# Patient Record
Sex: Female | Born: 1935
Health system: Southern US, Community
[De-identification: ages and names within clinical notes are randomized; demographics above are authoritative.]

## PROBLEM LIST (undated history)

## (undated) DIAGNOSIS — I10 Essential (primary) hypertension: Secondary | ICD-10-CM

## (undated) DIAGNOSIS — R05 Cough: Secondary | ICD-10-CM

## (undated) DIAGNOSIS — I639 Cerebral infarction, unspecified: Secondary | ICD-10-CM

## (undated) DIAGNOSIS — D491 Neoplasm of unspecified behavior of respiratory system: Secondary | ICD-10-CM

## (undated) DIAGNOSIS — R42 Dizziness and giddiness: Secondary | ICD-10-CM

## (undated) DIAGNOSIS — R053 Chronic cough: Secondary | ICD-10-CM

## (undated) DIAGNOSIS — E785 Hyperlipidemia, unspecified: Secondary | ICD-10-CM

## (undated) HISTORY — DX: Chronic cough: R05.3

## (undated) HISTORY — PX: BREAST BIOPSY: SHX20

## (undated) HISTORY — PX: TONSILLECTOMY AND ADENOIDECTOMY: SUR1326

## (undated) HISTORY — PX: CATARACT EXTRACTION, BILATERAL: SHX1313

## (undated) HISTORY — DX: Cough: R05

## (undated) HISTORY — DX: Cerebral infarction, unspecified: I63.9

## (undated) HISTORY — DX: Hyperlipidemia, unspecified: E78.5

## (undated) HISTORY — PX: BACK SURGERY: SHX140

## (undated) HISTORY — DX: Essential (primary) hypertension: I10

## (undated) HISTORY — DX: Neoplasm of unspecified behavior of respiratory system: D49.1

---
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
## 1982-10-17 DIAGNOSIS — D491 Neoplasm of unspecified behavior of respiratory system: Secondary | ICD-10-CM

## 1982-10-17 HISTORY — PX: LUNG SURGERY: SHX703

## 1982-10-17 HISTORY — DX: Neoplasm of unspecified behavior of respiratory system: D49.1

## 1998-08-26 ENCOUNTER — Ambulatory Visit (HOSPITAL_COMMUNITY): Admission: RE | Admit: 1998-08-26 | Discharge: 1998-08-26 | Payer: Self-pay | Admitting: *Deleted

## 1999-08-30 ENCOUNTER — Ambulatory Visit (HOSPITAL_COMMUNITY): Admission: RE | Admit: 1999-08-30 | Discharge: 1999-08-30 | Payer: Self-pay

## 2000-09-05 ENCOUNTER — Ambulatory Visit (HOSPITAL_COMMUNITY): Admission: RE | Admit: 2000-09-05 | Discharge: 2000-09-05 | Payer: Self-pay

## 2000-11-14 ENCOUNTER — Encounter: Payer: Self-pay | Admitting: Otolaryngology

## 2000-11-14 ENCOUNTER — Ambulatory Visit (HOSPITAL_COMMUNITY): Admission: RE | Admit: 2000-11-14 | Discharge: 2000-11-14 | Payer: Self-pay | Admitting: Otolaryngology

## 2001-09-12 ENCOUNTER — Encounter: Payer: Self-pay | Admitting: Family Medicine

## 2001-09-12 ENCOUNTER — Encounter (INDEPENDENT_AMBULATORY_CARE_PROVIDER_SITE_OTHER): Payer: Self-pay | Admitting: *Deleted

## 2001-09-12 ENCOUNTER — Encounter: Admission: RE | Admit: 2001-09-12 | Discharge: 2001-09-12 | Payer: Self-pay | Admitting: Family Medicine

## 2002-10-15 ENCOUNTER — Encounter: Payer: Self-pay | Admitting: Family Medicine

## 2002-10-15 ENCOUNTER — Encounter: Admission: RE | Admit: 2002-10-15 | Discharge: 2002-10-15 | Payer: Self-pay | Admitting: Family Medicine

## 2003-05-01 ENCOUNTER — Encounter: Payer: Self-pay | Admitting: Cardiology

## 2003-05-01 ENCOUNTER — Ambulatory Visit (HOSPITAL_COMMUNITY): Admission: RE | Admit: 2003-05-01 | Discharge: 2003-05-01 | Payer: Self-pay | Admitting: Family Medicine

## 2003-11-19 ENCOUNTER — Encounter: Admission: RE | Admit: 2003-11-19 | Discharge: 2003-11-19 | Payer: Self-pay | Admitting: Family Medicine

## 2004-11-22 ENCOUNTER — Encounter: Admission: RE | Admit: 2004-11-22 | Discharge: 2004-11-22 | Payer: Self-pay | Admitting: Family Medicine

## 2005-12-14 ENCOUNTER — Encounter: Admission: RE | Admit: 2005-12-14 | Discharge: 2005-12-14 | Payer: Self-pay | Admitting: Family Medicine

## 2006-10-20 ENCOUNTER — Encounter: Admission: RE | Admit: 2006-10-20 | Discharge: 2006-10-20 | Payer: Self-pay | Admitting: Family Medicine

## 2007-03-05 ENCOUNTER — Other Ambulatory Visit: Admission: RE | Admit: 2007-03-05 | Discharge: 2007-03-05 | Payer: Self-pay | Admitting: Family Medicine

## 2007-10-24 ENCOUNTER — Encounter: Admission: RE | Admit: 2007-10-24 | Discharge: 2007-10-24 | Payer: Self-pay | Admitting: Family Medicine

## 2008-10-27 ENCOUNTER — Encounter: Admission: RE | Admit: 2008-10-27 | Discharge: 2008-10-27 | Payer: Self-pay | Admitting: Family Medicine

## 2009-05-17 DEATH — deceased

## 2009-09-09 ENCOUNTER — Ambulatory Visit (HOSPITAL_COMMUNITY): Admission: RE | Admit: 2009-09-09 | Discharge: 2009-09-09 | Payer: Self-pay | Admitting: Interventional Radiology

## 2009-10-03 ENCOUNTER — Emergency Department (HOSPITAL_COMMUNITY): Admission: EM | Admit: 2009-10-03 | Discharge: 2009-10-03 | Payer: Self-pay | Admitting: Emergency Medicine

## 2009-10-29 ENCOUNTER — Encounter: Admission: RE | Admit: 2009-10-29 | Discharge: 2009-10-29 | Payer: Self-pay | Admitting: Family Medicine

## 2009-12-24 ENCOUNTER — Encounter (INDEPENDENT_AMBULATORY_CARE_PROVIDER_SITE_OTHER): Payer: Self-pay | Admitting: Emergency Medicine

## 2009-12-24 ENCOUNTER — Ambulatory Visit: Payer: Self-pay | Admitting: Vascular Surgery

## 2009-12-24 ENCOUNTER — Inpatient Hospital Stay (HOSPITAL_COMMUNITY): Admission: EM | Admit: 2009-12-24 | Discharge: 2009-12-26 | Payer: Self-pay | Admitting: Emergency Medicine

## 2009-12-24 ENCOUNTER — Ambulatory Visit: Payer: Self-pay | Admitting: Cardiology

## 2010-04-08 ENCOUNTER — Encounter: Admission: RE | Admit: 2010-04-08 | Discharge: 2010-04-08 | Payer: Self-pay | Admitting: Family Medicine

## 2010-04-14 ENCOUNTER — Encounter: Admission: RE | Admit: 2010-04-14 | Discharge: 2010-04-14 | Payer: Self-pay | Admitting: Family Medicine

## 2011-01-10 LAB — HEPATIC FUNCTION PANEL
ALT: 16 U/L (ref 0–35)
AST: 29 U/L (ref 0–37)
Albumin: 3.9 g/dL (ref 3.5–5.2)
Alkaline Phosphatase: 78 U/L (ref 39–117)
Bilirubin, Direct: 0.2 mg/dL (ref 0.0–0.3)
Indirect Bilirubin: 0.8 mg/dL (ref 0.3–0.9)
Total Bilirubin: 1 mg/dL (ref 0.3–1.2)
Total Protein: 6.9 g/dL (ref 6.0–8.3)

## 2011-01-10 LAB — URINALYSIS, ROUTINE W REFLEX MICROSCOPIC
Glucose, UA: NEGATIVE mg/dL
Protein, ur: NEGATIVE mg/dL
Specific Gravity, Urine: 1.005 (ref 1.005–1.030)
Urobilinogen, UA: 0.2 mg/dL (ref 0.0–1.0)

## 2011-01-10 LAB — URINE MICROSCOPIC-ADD ON

## 2011-01-10 LAB — COMPREHENSIVE METABOLIC PANEL WITH GFR
ALT: 15 U/L (ref 0–35)
AST: 21 U/L (ref 0–37)
Albumin: 3.2 g/dL — ABNORMAL LOW (ref 3.5–5.2)
Alkaline Phosphatase: 63 U/L (ref 39–117)
BUN: 16 mg/dL (ref 6–23)
CO2: 25 meq/L (ref 19–32)
Calcium: 8.3 mg/dL — ABNORMAL LOW (ref 8.4–10.5)
Chloride: 109 meq/L (ref 96–112)
Creatinine, Ser: 0.8 mg/dL (ref 0.4–1.2)
GFR calc non Af Amer: >60 mL/min
Glucose, Bld: 90 mg/dL (ref 70–99)
Potassium: 3.9 meq/L (ref 3.5–5.1)
Sodium: 141 meq/L (ref 135–145)
Total Bilirubin: 1 mg/dL (ref 0.3–1.2)
Total Protein: 5.9 g/dL — ABNORMAL LOW (ref 6.0–8.3)

## 2011-01-10 LAB — POCT CARDIAC MARKERS
CKMB, poc: 1.4 ng/mL (ref 1.0–8.0)
Myoglobin, poc: 84 ng/mL (ref 12–200)
Troponin i, poc: <0.05 ng/mL (ref 0.00–0.09)
Troponin i, poc: <0.05 ng/mL (ref 0.00–0.09)

## 2011-01-10 LAB — DIFFERENTIAL
Basophils Absolute: 0 10*3/uL (ref 0.0–0.1)
Lymphocytes Relative: 33 % (ref 12–46)
Neutro Abs: 3.3 10*3/uL (ref 1.7–7.7)
Neutrophils Relative %: 59 % (ref 43–77)

## 2011-01-10 LAB — APTT: aPTT: 28 s (ref 24–37)

## 2011-01-10 LAB — CBC
HCT: 39.3 % (ref 36.0–46.0)
Hemoglobin: 13.4 g/dL (ref 12.0–15.0)
MCV: 99.4 fL (ref 78.0–100.0)
Platelets: 183 10*3/uL (ref 150–400)
RBC: 3.96 MIL/uL (ref 3.87–5.11)
RDW: 12.7 % (ref 11.5–15.5)
WBC: 4.9 10*3/uL (ref 4.0–10.5)

## 2011-01-10 LAB — LIPID PANEL
LDL Cholesterol: 118 mg/dL — ABNORMAL HIGH (ref 0–99)
Triglycerides: 69 mg/dL (ref <150)

## 2011-01-10 LAB — PROTIME-INR: Prothrombin Time: 13.2 seconds (ref 11.6–15.2)

## 2011-01-10 LAB — CK TOTAL AND CKMB (NOT AT ARMC)
CK, MB: 2.5 ng/mL (ref 0.3–4.0)
Relative Index: INVALID (ref 0.0–2.5)
Total CK: 40 U/L (ref 7–177)

## 2011-01-10 LAB — TROPONIN I

## 2011-01-10 LAB — HEMOGLOBIN A1C
Hgb A1c MFr Bld: 5.6 % (ref 4.6–6.1)
Mean Plasma Glucose: 114 mg/dL

## 2011-01-10 LAB — URINE CULTURE

## 2011-01-10 LAB — BASIC METABOLIC PANEL
BUN: 14 mg/dL (ref 6–23)
Calcium: 8.6 mg/dL (ref 8.4–10.5)
GFR calc non Af Amer: >60 mL/min (ref >60)
Glucose, Bld: 96 mg/dL (ref 70–99)

## 2011-01-17 LAB — DIFFERENTIAL
Basophils Absolute: 0 10*3/uL (ref 0.0–0.1)
Basophils Relative: 1 % (ref 0–1)
Eosinophils Absolute: 0 10*3/uL (ref 0.0–0.7)
Eosinophils Relative: 0 % (ref 0–5)
Lymphocytes Relative: 28 % (ref 12–46)
Monocytes Absolute: 0.5 10*3/uL (ref 0.1–1.0)

## 2011-01-17 LAB — COMPREHENSIVE METABOLIC PANEL
ALT: 16 U/L (ref 0–35)
AST: 24 U/L (ref 0–37)
Albumin: 3.8 g/dL (ref 3.5–5.2)
Alkaline Phosphatase: 86 U/L (ref 39–117)
CO2: 27 mEq/L (ref 19–32)
Chloride: 105 mEq/L (ref 96–112)
Creatinine, Ser: 0.86 mg/dL (ref 0.4–1.2)
GFR calc Af Amer: >60 mL/min (ref >60)
GFR calc non Af Amer: >60 mL/min (ref >60)
Potassium: 3.8 mEq/L (ref 3.5–5.1)
Total Bilirubin: 0.7 mg/dL (ref 0.3–1.2)

## 2011-01-17 LAB — CBC
HCT: 40.1 % (ref 36.0–46.0)
Hemoglobin: 14 g/dL (ref 12.0–15.0)
MCV: 99.6 fL (ref 78.0–100.0)
Platelets: 176 10*3/uL (ref 150–400)
WBC: 5.8 10*3/uL (ref 4.0–10.5)

## 2011-01-17 LAB — PROTIME-INR: Prothrombin Time: 13.3 seconds (ref 11.6–15.2)

## 2011-01-19 LAB — CBC
HCT: 43.5 % (ref 36.0–46.0)
Hemoglobin: 15 g/dL (ref 12.0–15.0)
MCV: 99.5 fL (ref 78.0–100.0)
Platelets: 286 10*3/uL (ref 150–400)
RDW: 13.1 % (ref 11.5–15.5)

## 2011-01-19 LAB — BASIC METABOLIC PANEL
BUN: 20 mg/dL (ref 6–23)
CO2: 28 mEq/L (ref 19–32)
Chloride: 105 mEq/L (ref 96–112)
GFR calc non Af Amer: >60 mL/min (ref >60)
Glucose, Bld: 99 mg/dL (ref 70–99)
Potassium: 3.8 mEq/L (ref 3.5–5.1)
Sodium: 140 mEq/L (ref 135–145)

## 2011-01-19 LAB — APTT: aPTT: 27 seconds (ref 24–37)

## 2011-03-11 ENCOUNTER — Other Ambulatory Visit: Payer: Self-pay | Admitting: Family Medicine

## 2011-03-11 DIAGNOSIS — Z1231 Encounter for screening mammogram for malignant neoplasm of breast: Secondary | ICD-10-CM

## 2011-04-11 ENCOUNTER — Ambulatory Visit
Admission: RE | Admit: 2011-04-11 | Discharge: 2011-04-11 | Disposition: A | Discharge status: Home / Self Care | Payer: BC Managed Care – PPO | Source: Ambulatory Visit | Attending: Family Medicine | Admitting: Family Medicine

## 2011-04-11 DIAGNOSIS — Z1231 Encounter for screening mammogram for malignant neoplasm of breast: Secondary | ICD-10-CM

## 2011-06-09 ENCOUNTER — Other Ambulatory Visit: Payer: Self-pay | Admitting: Otolaryngology

## 2011-06-09 ENCOUNTER — Ambulatory Visit
Admission: RE | Admit: 2011-06-09 | Discharge: 2011-06-09 | Disposition: A | Discharge status: Home / Self Care | Payer: Medicare Other | Source: Ambulatory Visit | Attending: Otolaryngology | Admitting: Otolaryngology

## 2011-06-09 DIAGNOSIS — R05 Cough: Secondary | ICD-10-CM

## 2011-06-09 DIAGNOSIS — R059 Cough, unspecified: Secondary | ICD-10-CM

## 2011-08-24 ENCOUNTER — Institutional Professional Consult (permissible substitution): Payer: Medicare Other | Admitting: Critical Care Medicine

## 2011-09-06 ENCOUNTER — Encounter: Payer: Self-pay | Admitting: Critical Care Medicine

## 2011-09-07 ENCOUNTER — Encounter: Payer: Self-pay | Admitting: Critical Care Medicine

## 2011-09-07 ENCOUNTER — Ambulatory Visit (INDEPENDENT_AMBULATORY_CARE_PROVIDER_SITE_OTHER): Payer: Medicare Other | Admitting: Critical Care Medicine

## 2011-09-07 VITALS — BP 124/78 | HR 61 | Temp 97.9°F | Ht 64.0 in | Wt 118.0 lb

## 2011-09-07 DIAGNOSIS — R05 Cough: Secondary | ICD-10-CM | POA: Insufficient documentation

## 2011-09-07 DIAGNOSIS — I6350 Cerebral infarction due to unspecified occlusion or stenosis of unspecified cerebral artery: Secondary | ICD-10-CM

## 2011-09-07 DIAGNOSIS — I639 Cerebral infarction, unspecified: Secondary | ICD-10-CM

## 2011-09-07 DIAGNOSIS — R059 Cough, unspecified: Secondary | ICD-10-CM

## 2011-09-07 DIAGNOSIS — C3490 Malignant neoplasm of unspecified part of unspecified bronchus or lung: Secondary | ICD-10-CM

## 2011-09-07 DIAGNOSIS — I10 Essential (primary) hypertension: Secondary | ICD-10-CM

## 2011-09-07 DIAGNOSIS — R053 Chronic cough: Secondary | ICD-10-CM | POA: Insufficient documentation

## 2011-09-07 MED ORDER — HYDROCOD POLST-CPM POLST ER 10-8 MG PO CP12: 1.0000 | ORAL_CAPSULE | Freq: Two times a day (BID) | ORAL | Status: DC | PRN

## 2011-09-07 MED ORDER — FLUTICASONE PROPIONATE 50 MCG/ACT NA SUSP: 2.0000 | Freq: Every day | NASAL | Status: DC

## 2011-09-07 MED ORDER — BENZONATATE 100 MG PO CAPS: ORAL_CAPSULE | ORAL | Status: AC

## 2011-09-07 MED ORDER — BUDESONIDE 180 MCG/ACT IN AEPB: 2.0000 | INHALATION_SPRAY | Freq: Two times a day (BID) | RESPIRATORY_TRACT | Status: DC

## 2011-09-07 MED ORDER — CHLORPHENIRAMINE MALEATE CR 8 MG PO CPCR: 8.0000 mg | ORAL_CAPSULE | Freq: Every day | ORAL | Status: AC

## 2011-09-07 NOTE — Progress Notes (Signed)
Subjective:    Patient ID: KLOHE LOVERING, female    DOB: 01/25/1925, 77 y.o.   MRN: [id number]  HPI Comments: Chronic cough x1 year. Constantly clearing throat, hoarse.   Coughs up mucus , foamy, white  Cough This is a chronic problem. The current episode started more than 1 year ago. The problem has been unchanged. The problem occurs constantly (worse with exertion, ok flat at night). The cough is productive of sputum. Associated symptoms include nasal congestion, postnasal drip, a sore throat and shortness of breath. Pertinent negatives include no chest pain, chills, ear pain, eye redness, fever, headaches, heartburn, hemoptysis, myalgias, rash, rhinorrhea or wheezing. The symptoms are aggravated by pollens. Risk factors for lung disease include smoking/tobacco exposure. Treatments tried: PPIs. The treatment provided no relief. There is no history of asthma, bronchiectasis, bronchitis, COPD, emphysema, environmental allergies or pneumonia.   77 y.o.F Chronic cough  Past Medical History  Diagnosis Date  . Lung tumor 1984    LUL  . HTN (hypertension)   . Stroke   . Chronic cough      Family History  Problem Relation Age of Onset  . Diabetes Father   . Hearing loss    . Hypertension Father   . Stroke Mother   . Tuberculosis Sister   . Emphysema Brother      History   Social History  . Marital Status: Widowed    Spouse Name: N/A    Number of Children: N/A  . Years of Education: N/A   Occupational History  . retired    Social History Main Topics  . Smoking status: Former Smoker -- 0.5 packs/day for 40 years    Types: Cigarettes    Quit date: 10/17/1982  . Smokeless tobacco: Not on file  . Alcohol Use: No  . Drug Use: No  . Sexually Active: Not on file   Other Topics Concern  . Not on file   Social History Narrative  . No narrative on file     Allergies  Allergen Reactions  . Lipitor (Atorvastatin Calcium)   . Plavix (Clopidogrel Bisulfate)   . Statins     REACTION: effects muscles     Outpatient Prescriptions Prior to Visit  Medication Sig Dispense Refill  . amLODipine (NORVASC) 5 MG tablet Take 2.5 mg by mouth daily.       Marland Kitchen omeprazole (PRILOSEC) 40 MG capsule Take 40 mg by mouth 2 (two) times daily.           Review of Systems  Constitutional: Negative for fever, chills, diaphoresis, activity change, appetite change, fatigue and unexpected weight change.  HENT: Positive for congestion, sore throat and postnasal drip. Negative for hearing loss, ear pain, nosebleeds, facial swelling, rhinorrhea, sneezing, mouth sores, trouble swallowing, neck pain, neck stiffness, dental problem, voice change, sinus pressure, tinnitus and ear discharge.   Eyes: Negative for photophobia, discharge, redness, itching and visual disturbance.  Respiratory: Positive for cough and shortness of breath. Negative for apnea, hemoptysis, choking, chest tightness, wheezing and stridor.   Cardiovascular: Negative for chest pain, palpitations and leg swelling.  Gastrointestinal: Negative for heartburn, nausea, vomiting, abdominal pain, constipation, blood in stool and abdominal distention.  Genitourinary: Negative for dysuria, urgency, frequency, hematuria, flank pain, decreased urine volume and difficulty urinating.  Musculoskeletal: Negative for myalgias, back pain, joint swelling, arthralgias and gait problem.  Skin: Negative for color change, pallor and rash.  Neurological: Negative for dizziness, tremors, seizures, syncope, speech difficulty, weakness, light-headedness, numbness and headaches.  Hematological: Negative for environmental allergies and adenopathy. Does not bruise/bleed easily.  Psychiatric/Behavioral: Negative for confusion, sleep disturbance, dysphoric mood and agitation. The patient is not nervous/anxious.        Objective:   Physical Exam  Filed Vitals:   09/07/11 1026  BP: 124/78  Pulse: 61  Temp: 97.9 F (36.6 C)  TempSrc: Oral  Height:  5\' 4"  (1.626 m)  Weight: 118 lb (53.524 kg)  SpO2: 97%    Gen: Pleasant,thin WF incessantly clearing throat , in no distress,  normal affect  ENT: No lesions,  mouth clear,  oropharynx clear, ++ postnasal drip, mild nasal inflammation  Neck: No JVD, no TMG, no carotid bruits  Lungs: No use of accessory muscles, no dullness to percussion, clear without rales or rhonchi  Cardiovascular: RRR, heart sounds normal, no murmur or gallops, no peripheral edema  Abdomen: soft and NT, no HSM,  BS normal  Musculoskeletal: No deformities, no cyanosis or clubbing  Neuro: alert, non focal  Skin: Warm, no lesions or rashes  No results found.       Assessment & Plan:   Chronic cough Severe cyclical cough syndrome due to post nasal drip, reflux disease, and lower airway inflammation from reactive airways disease Noted normal spirometry Plan Cyclic cough protocol with Tussi caps and Tessalon Perles Begin inhaled Pulmicort 2 puffs twice a day Pheniramine 8 mg at bedtime Flonase 2 sprays each nostril daily    Updated Medication List Outpatient Encounter Prescriptions as of 09/07/2011  Medication Sig Dispense Refill  . amLODipine (NORVASC) 5 MG tablet Take 2.5 mg by mouth daily.       . benzonatate (TESSALON) 100 MG capsule Use per cough protocol  1-2 every 4 hours  90 capsule  4  . budesonide (PULMICORT FLEXHALER) 180 MCG/ACT inhaler Inhale 2 puffs into the lungs 2 (two) times daily.  1 each  5  . chlorpheniramine (CHLOR-TRIMETON) 8 MG capsule Take 1 capsule (8 mg total) by mouth at bedtime.  90 capsule  6  . fluticasone (FLONASE) 50 MCG/ACT nasal spray Place 2 sprays into the nose daily.  16 g  6  . Hydrocod Polst-Chlorphen Polst (TUSSICAPS) 10-8 MG CP12 Take 1 capsule by mouth 2 (two) times daily as needed.  20 each  0  . DISCONTD: omeprazole (PRILOSEC) 40 MG capsule Take 40 mg by mouth 2 (two) times daily.

## 2011-09-07 NOTE — Patient Instructions (Addendum)
Start cyclic cough protocol  With tussicaps and tessalon Start fluticasone nasal spray  two puff twice daily Use chlorpheniramine 8mg  at bedtime Start pulmicort two puff twice daily Return 1 month

## 2011-09-07 NOTE — Assessment & Plan Note (Signed)
Severe cyclical cough syndrome due to post nasal drip, reflux disease, and lower airway inflammation from reactive airways disease Noted normal spirometry Plan Cyclic cough protocol with Tussi caps and Tessalon Perles Begin inhaled Pulmicort 2 puffs twice a day Pheniramine 8 mg at bedtime Flonase 2 sprays each nostril daily

## 2011-09-26 ENCOUNTER — Ambulatory Visit: Payer: Medicare Other | Admitting: Critical Care Medicine

## 2011-10-19 ENCOUNTER — Ambulatory Visit (INDEPENDENT_AMBULATORY_CARE_PROVIDER_SITE_OTHER): Payer: Medicare Other | Admitting: Critical Care Medicine

## 2011-10-19 ENCOUNTER — Encounter: Payer: Self-pay | Admitting: Critical Care Medicine

## 2011-10-19 VITALS — BP 160/80 | HR 58 | Temp 97.6°F | Ht 64.0 in | Wt 117.0 lb

## 2011-10-19 DIAGNOSIS — R05 Cough: Secondary | ICD-10-CM

## 2011-10-19 DIAGNOSIS — R1310 Dysphagia, unspecified: Secondary | ICD-10-CM

## 2011-10-19 DIAGNOSIS — R053 Chronic cough: Secondary | ICD-10-CM

## 2011-10-19 DIAGNOSIS — R059 Cough, unspecified: Secondary | ICD-10-CM

## 2011-10-19 MED ORDER — OMEPRAZOLE 20 MG PO CPDR: 20.0000 mg | DELAYED_RELEASE_CAPSULE | Freq: Every day | ORAL | Status: DC

## 2011-10-19 NOTE — Assessment & Plan Note (Addendum)
Severe cyclical cough syndrome due to post nasal drip, reflux disease,Noted normal spirometry.  Doubt primary lung disease.. Failed cyclical cough.  Throat clearing is chronically habitual.  ?esophageal disorder Plan chk barium swallow ?GI referral Cont cough suppressants D/c pulmicort D/c antihistamines Cont flonase

## 2011-10-19 NOTE — Patient Instructions (Signed)
Stop tussicaps Stop chlorpheniramine Start omeprazole daily Obtain a barium swallow evaluation of esophagus Stop pulmicort

## 2011-10-20 NOTE — Progress Notes (Signed)
Subjective:    Patient ID: Belinda Webb, female    DOB: [birth year], 79 y.o.   MRN: [id number]  HPI Comments: Chronic cough x1 year. Constantly clearing throat, hoarse.   Coughs up mucus , foamy, white  Cough This is a chronic problem. The current episode started more than 1 year ago. The problem has been unchanged. The problem occurs constantly (worse with exertion, ok flat at night). The cough is productive of sputum. Associated symptoms include nasal congestion, postnasal drip, a sore throat and shortness of breath. Pertinent negatives include no chest pain, chills, ear pain, eye redness, fever, headaches, heartburn, hemoptysis, myalgias, rash, rhinorrhea or wheezing. The symptoms are aggravated by pollens. Risk factors for lung disease include smoking/tobacco exposure. Treatments tried: PPIs. The treatment provided no relief. There is no history of asthma, bronchiectasis, bronchitis, COPD, emphysema, environmental allergies or pneumonia.   78 y.o.F Chronic cough  10/19/11 When last seen Rx for cyclical cough and ICS have not helped Pt notes burping and worse after eating. Pt still incessantly clears throat and feels like she is choking  Past Medical History  Diagnosis Date  . Lung tumor 1984    LUL  . HTN (hypertension)   . Stroke   . Chronic cough      Family History  Problem Relation Age of Onset  . Diabetes Father   . Hearing loss    . Hypertension Father   . Stroke Mother   . Tuberculosis Sister   . Emphysema Brother      History   Social History  . Marital Status: Widowed    Spouse Name: N/A    Number of Children: N/A  . Years of Education: N/A   Occupational History  . retired    Social History Main Topics  . Smoking status: Former Smoker -- 0.5 packs/day for 40 years    Types: Cigarettes    Quit date: 10/17/1982  . Smokeless tobacco: Not on file  . Alcohol Use: No  . Drug Use: No  . Sexually Active: Not on file   Other Topics Concern  . Not on file     Social History Narrative  . No narrative on file     Allergies  Allergen Reactions  . Lipitor (Atorvastatin Calcium)   . Plavix (Clopidogrel Bisulfate)   . Statins     REACTION: effects muscles     Outpatient Prescriptions Prior to Visit  Medication Sig Dispense Refill  . amLODipine (NORVASC) 5 MG tablet Take 2.5 mg by mouth daily.       . fluticasone (FLONASE) 50 MCG/ACT nasal spray Place 2 sprays into the nose daily.  16 g  6  . budesonide (PULMICORT FLEXHALER) 180 MCG/ACT inhaler Inhale 2 puffs into the lungs 2 (two) times daily.  1 each  5  . Hydrocod Polst-Chlorphen Polst (TUSSICAPS) 10-8 MG CP12 Take 1 capsule by mouth 2 (two) times daily as needed.  20 each  0     Review of Systems  Constitutional: Negative for fever, chills, diaphoresis, activity change, appetite change, fatigue and unexpected weight change.  HENT: Positive for congestion, sore throat and postnasal drip. Negative for hearing loss, ear pain, nosebleeds, facial swelling, rhinorrhea, sneezing, mouth sores, trouble swallowing, neck pain, neck stiffness, dental problem, voice change, sinus pressure, tinnitus and ear discharge.   Eyes: Negative for photophobia, discharge, redness, itching and visual disturbance.  Respiratory: Positive for cough and shortness of breath. Negative for apnea, hemoptysis, choking, chest tightness, wheezing and stridor.  Cardiovascular: Negative for chest pain, palpitations and leg swelling.  Gastrointestinal: Negative for heartburn, nausea, vomiting, abdominal pain, constipation, blood in stool and abdominal distention.  Genitourinary: Negative for dysuria, urgency, frequency, hematuria, flank pain, decreased urine volume and difficulty urinating.  Musculoskeletal: Negative for myalgias, back pain, joint swelling, arthralgias and gait problem.  Skin: Negative for color change, pallor and rash.  Neurological: Negative for dizziness, tremors, seizures, syncope, speech difficulty,  weakness, light-headedness, numbness and headaches.  Hematological: Negative for environmental allergies and adenopathy. Does not bruise/bleed easily.  Psychiatric/Behavioral: Negative for confusion, sleep disturbance, dysphoric mood and agitation. The patient is not nervous/anxious.        Objective:   Physical Exam   Filed Vitals:   10/19/11 0857  BP: 160/80  Pulse: 58  Temp: 97.6 F (36.4 C)  TempSrc: Oral  Height: 5\' 4"  (1.626 m)  Weight: 117 lb (53.071 kg)  SpO2: 99%    Gen: Pleasant,thin WF incessantly clearing throat , in no distress,  normal affect  ENT: No lesions,  mouth clear,  oropharynx clear, ++ postnasal drip, mild nasal inflammation  Neck: No JVD, no TMG, no carotid bruits  Lungs: No use of accessory muscles, no dullness to percussion, clear without rales or rhonchi  Cardiovascular: RRR, heart sounds normal, no murmur or gallops, no peripheral edema  Abdomen: soft and NT, no HSM,  BS normal  Musculoskeletal: No deformities, no cyanosis or clubbing  Neuro: alert, non focal  Skin: Warm, no lesions or rashes  No results found.       Assessment & Plan:   Chronic cough Severe cyclical cough syndrome due to post nasal drip, reflux disease,Noted normal spirometry.  Doubt primary lung disease.. Failed cyclical cough.  Throat clearing is chronically habitual.  ?esophageal disorder Plan chk barium swallow ?GI referral Cont cough suppressants D/c pulmicort D/c antihistamines Cont flonase      Also start PPI QD Updated Medication List Outpatient Encounter Prescriptions as of 10/19/2011  Medication Sig Dispense Refill  . amLODipine (NORVASC) 5 MG tablet Take 2.5 mg by mouth daily.       . fluticasone (FLONASE) 50 MCG/ACT nasal spray Place 2 sprays into the nose daily.  16 g  6  . loratadine (CLARITIN) 10 MG tablet Take 10 mg by mouth daily.        Marland Kitchen DISCONTD: budesonide (PULMICORT FLEXHALER) 180 MCG/ACT inhaler Inhale 2 puffs into the lungs 2  (two) times daily.  1 each  5  . omeprazole (PRILOSEC) 20 MG capsule Take 1 capsule (20 mg total) by mouth daily.  30 capsule  4  . DISCONTD: Hydrocod Polst-Chlorphen Polst (TUSSICAPS) 10-8 MG CP12 Take 1 capsule by mouth 2 (two) times daily as needed.  20 each  0

## 2011-10-25 ENCOUNTER — Ambulatory Visit (HOSPITAL_COMMUNITY)
Admission: RE | Admit: 2011-10-25 | Discharge: 2011-10-25 | Disposition: A | Discharge status: Home / Self Care | Payer: Medicare Other | Source: Ambulatory Visit | Attending: Critical Care Medicine | Admitting: Critical Care Medicine

## 2011-10-25 DIAGNOSIS — R1310 Dysphagia, unspecified: Secondary | ICD-10-CM | POA: Insufficient documentation

## 2011-10-25 DIAGNOSIS — K224 Dyskinesia of esophagus: Secondary | ICD-10-CM | POA: Insufficient documentation

## 2011-10-25 DIAGNOSIS — R053 Chronic cough: Secondary | ICD-10-CM

## 2011-10-25 DIAGNOSIS — K219 Gastro-esophageal reflux disease without esophagitis: Secondary | ICD-10-CM | POA: Insufficient documentation

## 2011-10-25 DIAGNOSIS — R05 Cough: Secondary | ICD-10-CM

## 2011-10-25 DIAGNOSIS — K449 Diaphragmatic hernia without obstruction or gangrene: Secondary | ICD-10-CM | POA: Insufficient documentation

## 2011-10-26 ENCOUNTER — Telehealth: Payer: Self-pay | Admitting: Critical Care Medicine

## 2011-10-26 DIAGNOSIS — K224 Dyskinesia of esophagus: Secondary | ICD-10-CM

## 2011-10-26 DIAGNOSIS — R05 Cough: Secondary | ICD-10-CM

## 2011-10-26 DIAGNOSIS — R053 Chronic cough: Secondary | ICD-10-CM

## 2011-10-26 NOTE — Telephone Encounter (Signed)
appt to see dr Leone Payor 11/22/11 pt is aware

## 2011-10-26 NOTE — Telephone Encounter (Signed)
Esophagram shows severe dysmotility I will refer to GI Pt is aware

## 2011-11-16 ENCOUNTER — Ambulatory Visit: Payer: Medicare Other | Admitting: Critical Care Medicine

## 2011-11-22 ENCOUNTER — Encounter: Payer: Self-pay | Admitting: Internal Medicine

## 2011-11-22 ENCOUNTER — Other Ambulatory Visit (HOSPITAL_COMMUNITY): Payer: Self-pay | Admitting: Internal Medicine

## 2011-11-22 ENCOUNTER — Ambulatory Visit (INDEPENDENT_AMBULATORY_CARE_PROVIDER_SITE_OTHER): Payer: Medicare Other | Admitting: Internal Medicine

## 2011-11-22 DIAGNOSIS — R05 Cough: Secondary | ICD-10-CM

## 2011-11-22 DIAGNOSIS — K224 Dyskinesia of esophagus: Secondary | ICD-10-CM

## 2011-11-22 DIAGNOSIS — R053 Chronic cough: Secondary | ICD-10-CM

## 2011-11-22 DIAGNOSIS — R490 Dysphonia: Secondary | ICD-10-CM

## 2011-11-22 DIAGNOSIS — R059 Cough, unspecified: Secondary | ICD-10-CM

## 2011-11-22 NOTE — Progress Notes (Addendum)
Subjective:    Patient ID: Belinda Webb, female    DOB: 04-10-1925, 78 y.o.   MRN: [id number]  HPI this very pleasant elderly woman is here with her 2 sons because of a chronic cough and throat clearing problem. She has seen Dr. Delford Field of pulmonary. He is tried cyclic cough therapy and it has not helped. She a barium swallow which demonstrated findings as outlined in the data reviewed section. Mainly dysmotility and induced reflux. There was delayed pharyngeal motion with swallowing. She does have cervical osteophytes. She relates a one-year history or more of this throat clearing and cough which can be productive of whitish phlegm. There is some sinus drainage. He does not really have a sore throat but says things do not feel right. There is no dysphagia. His chronic coughing and throat clearing cause significant social issues for her when she is out in public and she is not leaving the house as much. She does have a diagnosis of COPD listed but does not seem to have respiratory difficulty with this. She has been on a PPI on a daily dose. She has seen him here nose and throat physician, at the end of the interview and physical, her son determined she had seen Dr. [name] Pane in the past but could not remember what was recommended or found. We have requested those records.  She notices that this does not bother her while she sleeps and when she lies down she does not have that problem. It is not related he necessarily but he can be. It is really fairly persistent and constant, and she is doing it while she is here in the office. She does not have any heartburn symptoms. She had a stroke in early 2011, I can find no record of a speech pathology evaluation, she does not recall having had her having any issues with fat. As best I can tell she has not had any overt aspiration or related problems.  Allergies  Allergen Reactions  . Amlodipine   . Lipitor (Atorvastatin Calcium)   . Macrobid   . Penicillins   .  Plavix (Clopidogrel Bisulfate)   . Statins     REACTION: effects muscles  . Sulfa Antibiotics    Outpatient Prescriptions Prior to Visit  Medication Sig Dispense Refill  . amLODipine (NORVASC) 5 MG tablet Take 5 mg by mouth daily.       . fluticasone (FLONASE) 50 MCG/ACT nasal spray Place 2 sprays into the nose daily.  16 g  6  . loratadine (CLARITIN) 10 MG tablet Take 10 mg by mouth daily.        Marland Kitchen omeprazole (PRILOSEC) 20 MG capsule Take 1 capsule (20 mg total) by mouth daily.  30 capsule  4   Past Medical History  Diagnosis Date  . Lung tumor 1984    LUL  . HTN (hypertension)   . Stroke   . Chronic cough   . HLD (hyperlipidemia)    Past Surgical History  Procedure Date  . Back surgery   . Total abdominal hysterectomy 1947  . Lung surgery 1984    LUL   . Appendectomy 1938  . Breast biopsy     left   History   Social History  . Marital Status: Widowed    Spouse Name: N/A    Number of Children: N/A  . Years of Education: N/A   Occupational History  . retired    Social History Main Topics  . Smoking status: Former Smoker --  0.5 packs/day for 40 years    Types: Cigarettes    Quit date: 10/17/1982  . Smokeless tobacco: Never Used  . Alcohol Use: No  . Drug Use: No  . Sexually Active: None   Other Topics Concern  . None   Social History Narrative  . None   Family History  Problem Relation Age of Onset  . Diabetes Father   . Hearing loss    . Hypertension Father   . Stroke Mother   . Tuberculosis Sister   . Emphysema Brother   . Lung cancer Brother   . Colon cancer Neg Hx   . Colon polyps Son   . Heart disease Father         Review of Systems Chronic back pain, reduced hearing All other ROS negative or as above    Objective:   Physical Exam General:  Well-developed, well-nourished and in no acute distress - constantly clearing throat - cough. Resolves with supine position Eyes:  anicteric. ENT:   Mouth and posterior pharynx free of lesions.   Neck:   supple w/o thyromegaly or mass. Mildly enlarged submandibular salivary glands that seem slightly tender Lungs: Clear to auscultation bilaterally. Heart:  S1S2, no rubs, murmurs, gallops. Abdomen:  soft, non-tender, no hepatosplenomegaly, hernia, or mass and BS+.  Lymph:  no cervical or supraclavicular adenopathy. Extremities:   no edema Neuro:  CN 2-12 groosly intact  Psych:  appropriate mood and  Affect.   Data Reviewed:  Barium swallow images viewed, Dr. Lynelle Doctor notes. Old hospital records. Request Dr. Christia Reading records.  Barium swallow 10-19-2011 Findings: Initial barium swallows demonstrate delayed pharyngeal  motion with swallowing. No laryngeal penetration or aspiration.  No upper esophageal webs, strictures or diverticuli. There is  posterior impression on the esophagus due to moderate ventral  spurring at C5-6. The piriform sinus and vallecular air spaces  appear normal.  Esophageal dysmotility is noted with poor propagation of the  primary peristaltic wave and occasional tertiary contractions.  Mild esophageal stasis. A small sliding type hiatal hernia is  noted and there were multiple episodes of GE reflux. No esophageal  stricture or mass. A 13 mm barium pill passed into the stomach,  hanging up briefly in the distal esophagus.  IMPRESSION:  1. Spurring changes C5-6 with mild impression on the cervical  esophagus.  2. Esophageal dysmotility.  3. Small sliding-type hiatal hernia and inducible GE reflux.  4. No stricture or mass.      Assessment & Plan:

## 2011-11-22 NOTE — Patient Instructions (Signed)
You have been scheduled for a Modified Barium Swallow at [facility] on 11/24/11 @ 10:00 am. Please arrive at the Radiology Department on the first floor. We will get your records from Dr. [name] Pane office for review.

## 2011-11-22 NOTE — Assessment & Plan Note (Addendum)
Cause not clear. I find it very extended this is a positional problem, in that when she lies down it resolves. I witnessed that today. She is a mild tenderness and perhaps enlargement of her submandibular salivary glands of unclear significance to me. GERD is always possible but difficult to prove in situations like this I think. Her case is somewhat atypical in GERD-induced cough however. I find it interesting that she has this delayed pharyngeal motion on the barium swallow. Hopefully the modified barium swallow will shed some light on things and also lead to some sort of therapy for relief.  I suppose it could be behavioral as well. I don't think is related to her previous stroke but really don't know at this point. It seemed to start some time after that, so it probably not temporally associated with it. I did not find anything mentioned about this and her discharge summary from that admission.  I do not think upper endoscopy which showed much light of anything at this point. We'll reserve that encased things were to change. She could need a trial of high-dose PPI therapy.   Dr. Christia Reading (ENT) saw patient in Aug and Oct 2012. Larynx was normal as was other ENT exam. He gave her a 1-?2 month trial of omeprazole 40 mg bid and no change in cough or hoarseness was found. Then sent to pulmonary. He reported she had seen an allergist also. We'll also keep in mind the C5 and C6 osteophytes. Question or some type of mechanical effect causing problems. Factoring in the improvement with supine position makes this more plausible though I've never really seen anything like that I would have to say.

## 2011-11-22 NOTE — Assessment & Plan Note (Signed)
Etiology not clear. Will review the ENT records.

## 2011-11-22 NOTE — Assessment & Plan Note (Signed)
Seen on barium swallow. Not surprising an 78 year old woman. At this point I'm not convinced is related to her problems. I think she has more of a pharyngeal issue. Modified barium swallow is ordered.

## 2011-11-24 ENCOUNTER — Ambulatory Visit (HOSPITAL_COMMUNITY)
Admission: RE | Admit: 2011-11-24 | Discharge: 2011-11-24 | Disposition: A | Discharge status: Home / Self Care | Payer: Medicare Other | Source: Ambulatory Visit | Attending: Internal Medicine | Admitting: Internal Medicine

## 2011-11-24 DIAGNOSIS — R490 Dysphonia: Secondary | ICD-10-CM | POA: Insufficient documentation

## 2011-11-24 DIAGNOSIS — R05 Cough: Secondary | ICD-10-CM

## 2011-11-24 DIAGNOSIS — R059 Cough, unspecified: Secondary | ICD-10-CM | POA: Insufficient documentation

## 2011-11-24 DIAGNOSIS — R053 Chronic cough: Secondary | ICD-10-CM

## 2011-11-24 NOTE — Procedures (Signed)
Modified Barium Swallow Procedure Note Patient Details  Name: Belinda Webb MRN: [id number] Date of Birth: July 23, 1925  Today's Date: 11/24/2011 Time: 0945 - 1017    Past Medical History:  Past Medical History  Diagnosis Date  . Lung tumor 1984    LUL  . HTN (hypertension)   . Stroke   . Chronic cough   . HLD (hyperlipidemia)    Past Surgical History:  Past Surgical History  Procedure Date  . Back surgery   . Total abdominal hysterectomy 1947  . Lung surgery 1984    LUL   . Appendectomy 1938  . Breast biopsy     left   HPI:  78 yo female referred by Dr Leone Payor for MBS due to chronic cough/hoarseness.  PMH + for CVA 12/2009 impacting right (basal ganglia), lung tumor (left upper) s/p surgery, HTN, chronic cough.  Pt reports seeing 3 MDs regarding this issue and taking medications without improvement.  Med list includes omeprazole, claritin, norvasc, flonase.  Fomer smoker, stopped in 1984 after smoking 1/2 PPD for 40 years.  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  Pt had an esophagram 10/19/2011 spurring change C5-C6 with mild impression in cervical esophagus, esophageal dysmotility, small sliding type hiatal hernia, and inducible GE reflux.  Pt complains of primary problem being chronic throat clearing, present x1 year worsening and hoarseness, this occured constantly during her visit today.       Recommendation/Prognosis  Clinical Impression Clinical impression: Pt appears with an overall functional oropharyngeal swallow without aspiration or penetration of any consistency tested.   Pt naturally takes very small bites/sips.  Pt did have minimal oral and tongue base liquid stasis that would spill into pharynx with delayed reflexive swallow.  Minimal delay in pharyngeal initiation observed which is likely compensatory for known esopahgeal deficits.  SLP questions if pt symptoms may be consistent with cricopharyngeal hypertension, given her symptoms of choking, globus and  chronic throat clearing.  Thanks for this referral.     Swallow Evaluation Recommendations Solid Consistency: Regular Liquid Consistency: Thin Liquid Administration via: Cup;Straw Compensations: Slow rate;Small sips/bites Postural Changes and/or Swallow Maneuvers: Upright 30-60 min after meal Oral Care Recommendations: Oral care BID Follow up Recommendations: None   Individuals Consulted Consulted and Agree with Results and Recommendations: Patient;Family member/caregiver Family Member Consulted: son Molly Maduro Report Sent to : Referring physician  General:  Date of Onset: 11/24/11 HPI: 78 yo female referred by Dr Leone Payor for MBS due to chronic cough/hoarseness.  PMH + for CVA 12/2009 impacting right (basal ganglia), lung tumor (left upper) s/p surgery, HTN, chronic cough.  Pt reports seeing 3 MDs regarding this issue and taking medications without improvement.  Med list includes omeprazole, claritin, norvasc, flonase.  Fomer smoker, stopped in 1984 after smoking 1/2 PPD for 40 years.  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  Pt had an esophagram 10/19/2011 spurring change C5-C6 with mild impression in cervical esophagus, esophageal dysmotility, small sliding type hiatal hernia, and inducible GE reflux.  Pt complains of primary problem being chronic throat clearing, present x1 year worsening and hoarseness, this occured constantly during her visit today.  Pt denied problems swallowing after her CVA in 2011.     Type of Study: Initial MBS Diet Prior to this Study: Regular;Thin liquids Respiratory Status: Room air Behavior/Cognition: Alert;Cooperative Oral Cavity - Dentition: Adequate natural dentition Oral Motor / Sensory Function: Impaired motor (velum elevation deviation slight) Vision: Functional for self-feeding Patient  Positioning: Postural control adequate for testing Baseline Vocal Quality: Clear Volitional Cough: Strong (productive to frothy white secretions per  pt) Volitional Swallow: Able to elicit Anatomy: Within functional limits Pharyngeal Secretions: Not observed secondary MBS  Reason for Referral:  Hoarseness, chronic throat clearing  Oral Phase Oral Preparation/Oral Phase Oral Phase: Impaired Oral - Nectar Oral - Nectar Cup: Other (Comment) (oral stasis spills into pharynx with delayed swallow) Oral - Thin Oral - Thin Cup: Other (Comment) (oral stasis spills into pharynx with delayed swallow) Oral - Thin Straw: Other (Comment) (oral stasis spills into pharynx with delayed swallow) Oral - Solids Oral - Puree: Within functional limits Oral - Regular: Within functional limits Oral - Pill: Other (Comment) (pt unable to orally transit, expectorated per slp cue) Oral Phase - Comment Oral Phase - Comment: suspect oral residuals and piecemeal deglutition are compensatory for this pt given her chronic deficit Pharyngeal Phase  Pharyngeal Phase Pharyngeal Phase: Impaired Pharyngeal - Nectar Pharyngeal - Nectar Cup: Delayed swallow initiation Pharyngeal - Thin Pharyngeal - Thin Cup: Delayed swallow initiation Pharyngeal - Thin Straw: Delayed swallow initiation Pharyngeal - Solids Pharyngeal - Puree: Within functional limits Pharyngeal - Regular: Within functional limits Pharyngeal Phase - Comment Pharyngeal Comment: delayed swallow was minimal and suspect secondary to known h/o esophageal issues, minimal amount of oral stasis spilled into pharynx to level of pyriform sinus with thin, clearing with delayed reflexive swallow Cervical Esophageal Phase  Cervical Esophageal Phase Cervical Esophageal Phase: [facility] Cervical Esophageal Phase - Comment Cervical Esophageal Comment: Appearance of cervical osteophyte seen on esophagram did not appear to impact bolus flow,    4 scans of esophagus appeared completely clear (radiologist not present to confirm)     Chales Abrahams 11/24/2011, 11:07 AM

## 2011-11-25 NOTE — Progress Notes (Signed)
Quick Note:  She needs omeprazole 40 mg bid before breakfast and supper # 60 with 2 refills. Needs to take it that way to see if reflux causing her problems - if it is then it can help but must take it this way for at least 2 months If after 2-3 months still having problems then she can follow-up with Dr. Delford Field or PCP. her situation is unusual in that when she lies down it gets better - she does have cervical spine disease and that may be causing some problem but I have never seen it do this - could consider neck CT or MRI but she would need to talk to PCP about that I think. ______

## 2011-11-25 NOTE — Progress Notes (Signed)
Quick Note:  Recommendation/Prognosis Speech Pathology Clinical Impression Clinical impression: Pt appears with an overall functional oropharyngeal swallow without aspiration or penetration of any consistency tested. Pt naturally takes very small bites/sips. Pt did have minimal oral and tongue base liquid stasis that would spill into pharynx with delayed reflexive swallow. Minimal delay in pharyngeal initiation observed which is likely compensatory for known esopahgeal deficits. SLP questions if pt symptoms may be consistent with cricopharyngeal hypertension, given her symptoms of choking, globus and chronic throat clearing. Thanks for this referral.   ______

## 2011-11-25 NOTE — Progress Notes (Signed)
Quick Note:  Let her know that this did not provide an explanation for her problem.   Please ask her if she ever took Prilosec or similar drug for two or more months - Dr. [name] Pane had recommended that but I am not sure she took it that long.  Let me know ______

## 2011-11-28 ENCOUNTER — Other Ambulatory Visit: Payer: Self-pay

## 2011-11-28 DIAGNOSIS — R05 Cough: Secondary | ICD-10-CM

## 2011-11-28 DIAGNOSIS — R053 Chronic cough: Secondary | ICD-10-CM

## 2011-11-28 MED ORDER — OMEPRAZOLE 20 MG PO CPDR: 40.0000 mg | DELAYED_RELEASE_CAPSULE | Freq: Two times a day (BID) | ORAL | Status: DC

## 2012-03-08 ENCOUNTER — Other Ambulatory Visit: Payer: Self-pay | Admitting: Family Medicine

## 2012-03-08 DIAGNOSIS — Z1231 Encounter for screening mammogram for malignant neoplasm of breast: Secondary | ICD-10-CM

## 2012-04-11 ENCOUNTER — Ambulatory Visit
Admission: RE | Admit: 2012-04-11 | Discharge: 2012-04-11 | Disposition: A | Discharge status: Home / Self Care | Payer: Medicare Other | Source: Ambulatory Visit | Attending: Family Medicine | Admitting: Family Medicine

## 2012-04-11 DIAGNOSIS — Z1231 Encounter for screening mammogram for malignant neoplasm of breast: Secondary | ICD-10-CM

## 2012-04-15 ENCOUNTER — Encounter (HOSPITAL_COMMUNITY): Payer: Self-pay | Admitting: *Deleted

## 2012-04-15 ENCOUNTER — Emergency Department (HOSPITAL_COMMUNITY): Payer: Medicare Other

## 2012-04-15 ENCOUNTER — Observation Stay (HOSPITAL_COMMUNITY)
Admission: EM | Admit: 2012-04-15 | Discharge: 2012-04-17 | Disposition: A | Discharge status: Home / Self Care | Payer: Medicare Other | Source: Ambulatory Visit | Attending: Family Medicine | Admitting: Family Medicine

## 2012-04-15 DIAGNOSIS — S0990XA Unspecified injury of head, initial encounter: Secondary | ICD-10-CM | POA: Diagnosis not present

## 2012-04-15 DIAGNOSIS — R55 Syncope and collapse: Secondary | ICD-10-CM | POA: Diagnosis not present

## 2012-04-15 DIAGNOSIS — Z8673 Personal history of transient ischemic attack (TIA), and cerebral infarction without residual deficits: Secondary | ICD-10-CM | POA: Diagnosis not present

## 2012-04-15 DIAGNOSIS — I10 Essential (primary) hypertension: Secondary | ICD-10-CM

## 2012-04-15 DIAGNOSIS — Y9210 Unspecified residential institution as the place of occurrence of the external cause: Secondary | ICD-10-CM | POA: Diagnosis not present

## 2012-04-15 DIAGNOSIS — Y849 Medical procedure, unspecified as the cause of abnormal reaction of the patient, or of later complication, without mention of misadventure at the time of the procedure: Secondary | ICD-10-CM | POA: Insufficient documentation

## 2012-04-15 DIAGNOSIS — E119 Type 2 diabetes mellitus without complications: Secondary | ICD-10-CM

## 2012-04-15 DIAGNOSIS — W19XXXA Unspecified fall, initial encounter: Secondary | ICD-10-CM

## 2012-04-15 DIAGNOSIS — Y92009 Unspecified place in unspecified non-institutional (private) residence as the place of occurrence of the external cause: Secondary | ICD-10-CM | POA: Diagnosis not present

## 2012-04-15 DIAGNOSIS — R279 Unspecified lack of coordination: Secondary | ICD-10-CM | POA: Insufficient documentation

## 2012-04-15 DIAGNOSIS — R1310 Dysphagia, unspecified: Secondary | ICD-10-CM | POA: Diagnosis present

## 2012-04-15 DIAGNOSIS — E785 Hyperlipidemia, unspecified: Secondary | ICD-10-CM | POA: Diagnosis not present

## 2012-04-15 DIAGNOSIS — R42 Dizziness and giddiness: Secondary | ICD-10-CM

## 2012-04-15 DIAGNOSIS — R404 Transient alteration of awareness: Secondary | ICD-10-CM | POA: Diagnosis present

## 2012-04-15 DIAGNOSIS — Z91199 Patient's noncompliance with other medical treatment and regimen due to unspecified reason: Secondary | ICD-10-CM | POA: Diagnosis not present

## 2012-04-15 DIAGNOSIS — I714 Abdominal aortic aneurysm, without rupture, unspecified: Secondary | ICD-10-CM | POA: Insufficient documentation

## 2012-04-15 DIAGNOSIS — G971 Other reaction to spinal and lumbar puncture: Secondary | ICD-10-CM | POA: Diagnosis not present

## 2012-04-15 DIAGNOSIS — Z9119 Patient's noncompliance with other medical treatment and regimen: Secondary | ICD-10-CM | POA: Insufficient documentation

## 2012-04-15 HISTORY — DX: Dizziness and giddiness: R42

## 2012-04-15 LAB — CBC WITH DIFFERENTIAL/PLATELET
Eosinophils Absolute: 0 10*3/uL (ref 0.0–0.7)
Eosinophils Relative: 1 % (ref 0–5)
HCT: 41.8 % (ref 36.0–46.0)
Hemoglobin: 13.9 g/dL (ref 12.0–15.0)
Lymphs Abs: 1.4 10*3/uL (ref 0.7–4.0)
MCH: 32.5 pg (ref 26.0–34.0)
MCHC: 33.3 g/dL (ref 30.0–36.0)
MCV: 97.7 fL (ref 78.0–100.0)
Monocytes Absolute: 0.7 10*3/uL (ref 0.1–1.0)
Monocytes Relative: 11 % (ref 3–12)
Neutrophils Relative %: 67 % (ref 43–77)
RBC: 4.28 MIL/uL (ref 3.87–5.11)

## 2012-04-15 LAB — COMPREHENSIVE METABOLIC PANEL
Alkaline Phosphatase: 83 U/L (ref 39–117)
BUN: 18 mg/dL (ref 6–23)
Creatinine, Ser: 0.8 mg/dL (ref 0.50–1.10)
GFR calc Af Amer: 76 mL/min — ABNORMAL LOW (ref >90)
Glucose, Bld: 91 mg/dL (ref 70–99)
Potassium: 4 mEq/L (ref 3.5–5.1)
Total Protein: 6.7 g/dL (ref 6.0–8.3)

## 2012-04-15 LAB — CSF CELL COUNT WITH DIFFERENTIAL
Eosinophils, CSF: 0 % (ref 0–1)
RBC Count, CSF: 226 /mm3 — ABNORMAL HIGH
Tube #: 1
Tube #: 4
WBC, CSF: 7 /mm3 — ABNORMAL HIGH (ref 0–5)

## 2012-04-15 LAB — URINE MICROSCOPIC-ADD ON

## 2012-04-15 LAB — URINALYSIS, ROUTINE W REFLEX MICROSCOPIC
Ketones, ur: 15 mg/dL — AB
Nitrite: NEGATIVE
Protein, ur: NEGATIVE mg/dL
pH: 7 (ref 5.0–8.0)

## 2012-04-15 MED ORDER — SODIUM CHLORIDE 0.9 % IJ SOLN: 3.0000 mL | Freq: Two times a day (BID) | INTRAMUSCULAR | Status: DC

## 2012-04-15 MED ORDER — ACETAMINOPHEN 325 MG PO TABS
650.0000 mg | ORAL_TABLET | Freq: Four times a day (QID) | ORAL | Status: DC | PRN
  Administered 2012-04-16: 650 mg via ORAL
  Filled 2012-04-15 (×2): qty 2

## 2012-04-15 MED ORDER — IOHEXOL 350 MG/ML SOLN
80.0000 mL | Freq: Once | INTRAVENOUS | Status: AC | PRN
  Administered 2012-04-15: 80 mL via INTRAVENOUS

## 2012-04-15 MED ORDER — ALUM & MAG HYDROXIDE-SIMETH 200-200-20 MG/5ML PO SUSP: 30.0000 mL | Freq: Four times a day (QID) | ORAL | Status: DC | PRN

## 2012-04-15 MED ORDER — SODIUM CHLORIDE 0.9 % IJ SOLN: 3.0000 mL | INTRAMUSCULAR | Status: DC | PRN

## 2012-04-15 MED ORDER — MECLIZINE HCL 12.5 MG PO TABS
12.5000 mg | ORAL_TABLET | Freq: Two times a day (BID) | ORAL | Status: DC | PRN
  Filled 2012-04-15: qty 1

## 2012-04-15 MED ORDER — SODIUM CHLORIDE 0.9 % IJ SOLN
3.0000 mL | Freq: Two times a day (BID) | INTRAMUSCULAR | Status: DC
  Administered 2012-04-15 – 2012-04-17 (×4): 3 mL via INTRAVENOUS

## 2012-04-15 MED ORDER — ONDANSETRON HCL 4 MG PO TABS: 4.0000 mg | ORAL_TABLET | Freq: Four times a day (QID) | ORAL | Status: DC | PRN

## 2012-04-15 MED ORDER — ACETAMINOPHEN 650 MG RE SUPP: 650.0000 mg | Freq: Four times a day (QID) | RECTAL | Status: DC | PRN

## 2012-04-15 MED ORDER — AMLODIPINE BESYLATE 5 MG PO TABS
5.0000 mg | ORAL_TABLET | Freq: Every day | ORAL | Status: DC
  Administered 2012-04-15 – 2012-04-17 (×3): 5 mg via ORAL
  Filled 2012-04-15 (×3): qty 1

## 2012-04-15 MED ORDER — SODIUM CHLORIDE 0.9 % IV SOLN: 250.0000 mL | INTRAVENOUS | Status: DC | PRN

## 2012-04-15 MED ORDER — ONDANSETRON HCL 4 MG/2ML IJ SOLN: 4.0000 mg | Freq: Four times a day (QID) | INTRAMUSCULAR | Status: DC | PRN

## 2012-04-15 NOTE — ED Notes (Signed)
Pt in CT scan.

## 2012-04-15 NOTE — H&P (Signed)
History and Physical  Belinda Webb:[id number] DOB: 1925/08/16 DOA: 04/15/2012  Referring physician: Glynn Octave, MD PCP: Rudi Heap, MD   Chief Complaint: Fall  HPI:  78 year old woman with a history of chronic vertigo presented to the emergency department today with a history of a fall. The patient got up this morning and was dizzy so took her meclizine, the fetched the paper and sat in bed reading the paper as her per her usual routine. She then got up and walked outside to get the paper to a neighbor she usually does. Her dizziness was resolved at that point. She has chronic vertigo for at least 6 months now for which she takes meclizine.  While she was outside she suddenly had an intense pressure-like sensation in her head. She denies that it was painful but does note that it was intense. She immediately lost control of her body and fell into the shrubbery. She struck her head on the concrete as well as her left shoulder. She believes that she lost consciousness for a few seconds prior to her fall. Immediately afterwards she felt fine except for pain where she had struck the ground. She lay on the ground for a few minutes was able to gather her strength up and get up. When her son arrived they found her mentation be normal and no obvious focal deficits were seen.   In the emergency department she was noted to be afebrile with stable vital signs. Laboratory investigation including complete metabolic panel, cardiac enzymes, CBC were unremarkable. Chest x-ray was unremarkable as was CT of the head and CT angiogram of the chest (done to exclude pulmonary embolism). She still does not feel quite right but has no focal neurologic complaints, no headache or neck pain and no recurrence of the pressure sensation. She reports no loss of bowel or bladder function and no history to suggest seizure.  Review of Systems:  Negative for fever, changes to her vision, sore throat, rash, muscle aches  (except for her left shoulder), chest pain, shortness of breath, dysuria, bleeding, nausea, vomiting, dysarthria, dysphagia, weakness or numbness of upper and lower extremities, facial droop.  Past Medical History  Diagnosis Date  . Lung tumor 1984    LUL  . HTN (hypertension)   . Stroke   . Chronic cough   . HLD (hyperlipidemia)   Reports lung tumor was benign. Lost fine motor coordination on the right hand from previous stroke. Requires no assistive device to walk. Past Surgical History  Procedure Date  . Back surgery   . Total abdominal hysterectomy 1947  . Lung surgery 1984    LUL   . Appendectomy 1938  . Breast biopsy     left   Social History:  reports that she quit smoking about 29 years ago. Her smoking use included Cigarettes. She has a 20 pack-year smoking history. She has never used smokeless tobacco. She reports that she does not drink alcohol or use illicit drugs.  Allergies  Allergen Reactions  . Lipitor (Atorvastatin Calcium) Other (See Comments)    Reaction unknown  . Nitrofurantoin Monohyd Macro Other (See Comments)    Reaction unknown  . Penicillins Other (See Comments)    Reaction unknown  . Plavix (Clopidogrel Bisulfate) Other (See Comments)    Reaction unknown  . Statins     REACTION: effects muscles  . Sulfa Antibiotics Other (See Comments)    Reaction unknown   Family History  Problem Relation Age of Onset  . Diabetes  Father   . Hearing loss    . Hypertension Father   . Stroke Mother   . Tuberculosis Sister   . Emphysema Brother   . Lung cancer Brother   . Colon cancer Neg Hx   . Colon polyps Son   . Heart disease Father    Prior to Admission medications   Medication Sig Start Date End Date Taking? Authorizing Provider  amLODipine (NORVASC) 5 MG tablet Take 5 mg by mouth daily.    Yes Historical Provider, MD  MECLIZINE HCL PO Take 1 tablet by mouth daily as needed. For vertigo   Yes Historical Provider, MD   Physical Exam: Filed Vitals:    04/15/12 1652 04/15/12 1655 04/15/12 1657 04/15/12 1742  BP: 153/70 165/82 137/92 173/74  Pulse: 61 64 88   Temp:    97.8 F (36.6 C)  TempSrc:    Oral  Resp:    16  SpO2:    98%     General:  Appears calm and comfortable. Exam and in the emergency department. Speech fluent and clear. Head has no obvious ecchymosis or pain with palpation.  Eyes: Pupils equal, round, react to light. Normal lids, irises, conjunctiva.  ENT: Grossly unremarkable--somewhat hard of hearing. Lips and tongue appear unremarkable.  Neck: No lymphadenopathy or masses. No thyromegaly. No tenderness over the cervical spine. Moves neck without pain.  Cardiovascular: Regular rate and rhythm. No murmur, rub, gallop. No lower extremity edema.  Respiratory: Clear to auscultation bilaterally. No wheezes, rales, rhonchi. Normal respiratory effort.  Abdomen: Soft, nontender, nondistended.  Skin: No rash or induration. Abrasions of the arms noted.  Musculoskeletal: Strength 5/5 upper and lower extremities bilaterally. Grossly normal tone.  Psychiatric: Grossly normal mood and affect. Speech fluent and appropriate.  Neurologic: Cranial nerves 2-12 intact. Speech fluent and clear. No dysdiadochokinesis of the upper extremities.  Labs on Admission:  Basic Metabolic Panel:  Lab 04/15/12 4696  NA 139  K 4.0  CL 102  CO2 22  GLUCOSE 91  BUN 18  CREATININE 0.80  CALCIUM 9.2  MG --  PHOS --   Liver Function Tests:  Lab 04/15/12 1430  AST 29  ALT 17  ALKPHOS 83  BILITOT 0.8  PROT 6.7  ALBUMIN 3.8   CBC:  Lab 04/15/12 1430  WBC 6.7  NEUTROABS 4.5  HGB 13.9  HCT 41.8  MCV 97.7  PLT 190   Cardiac Enzymes:  Lab 04/15/12 1430  CKTOTAL 71  CKMB 2.8  CKMBINDEX --  TROPONINI <0.30   Radiological Exams on Admission: Dg Chest 2 View  04/15/2012  *RADIOLOGY REPORT*  Clinical Data: Loss of consciousness  CHEST - 2 VIEW  Comparison: None  Findings: Heart size is normal.  No pleural effusion or  edema.  No airspace consolidation.  Lungs are hyperinflated and there are coarsened interstitial changes suggestive of COPD.  Postoperative changes noted the left upper lobe.  IMPRESSION:  1.  No acute cardiopulmonary abnormalities.  Original Report Authenticated By: Rosealee Albee, M.D.   Ct Head Wo Contrast  04/15/2012  *RADIOLOGY REPORT*  Clinical Data: Loss of consciousness.  CT HEAD WITHOUT CONTRAST  Technique:  Contiguous axial images were obtained from the base of the skull through the vertex without contrast.  Comparison: Head CT 12/24/2009.  Findings: No acute displaced skull fractures are identified.  No acute intracranial abnormality.  Specifically, no definite evidence of acute/subacute cerebral ischemia, no signs of acute post- traumatic intracranial hemorrhage, no focal mass, mass effect, hydrocephalus  or abnormal intra or extra-axial fluid collections. Old lacunar infarctions are noted in the lateral aspect of the left thalamus, posterior limb of the right internal capsule, and anterior limb of the left internal capsule.  Cerebral and cerebellar atrophy is age appropriate.  There are extensive patchy and confluent areas of decreased attenuation throughout the deep and periventricular white matter of the cerebral hemispheres bilaterally, compatible with chronic microvascular ischemic changes.  Visualized paranasal sinuses and mastoids are well pneumatized.  IMPRESSION: 1.  No acute intracranial abnormalities. 2.  Cerebral and cerebellar atrophy with multiple old lacunar infarctions and chronic microvascular ischemic changes in the cerebral white matter, as above.  Original Report Authenticated By: Florencia Reasons, M.D.   Ct Angio Chest W/cm &/or Wo Cm  04/15/2012  *RADIOLOGY REPORT*  Clinical Data: Fall.  Loss of consciousness.  CT ANGIOGRAPHY CHEST  Technique:  Multidetector CT imaging of the chest using the standard protocol during bolus administration of intravenous contrast. Multiplanar  reconstructed images including MIPs were obtained and reviewed to evaluate the vascular anatomy.  Contrast: 80mL OMNIPAQUE IOHEXOL 350 MG/ML SOLN  Comparison: None  Findings: No axillary or supraclavicular adenopathy.  There is no mediastinal or hilar adenopathy. Saccular aneurysmal involving the aortic arch is identified measuring approximately 1.3 cm, image 25.  No mediastinal or hilar adenopathy present.  There is no pericardial or pleural effusion.  Calcifications involving the LAD coronary artery noted.  No abnormal filling defects within the main pulmonary artery or its branches are identified to suggest an acute pulmonary embolus.  There are dependent type changes identified in both lung bases.  No airspace consolidation or atelectasis.  No suspicious pulmonary parenchymal nodule or mass noted.  The bones are osteopenic.  There are no worrisome lytic or sclerotic bone lesions.  IMPRESSION:  1.  No evidence for acute pulmonary embolus or acute cardiopulmonary abnormality. 2.  Coronary artery calcifications. 3.  Focal aneurysm arises from the aortic arch  Original Report Authenticated By: Rosealee Albee, M.D.    EKG: Independently reviewed. Normal sinus rhythm. No acute changes.   Principal Problem:  *Fall Active Problems:  Syncope   Assessment/Plan 1. Fall: Etiology unclear. Differential includes syncope (vasovagal or vertiginous). Stroke or arrhythmia seems unlikely based on history and examination. CT of the chest was negative for pulmonary embolism. Her history is somewhat concerning for subarachnoid hemorrhage. While head CT is quite sensitive it cannot exclude this diagnosis. I discussed my clinical impression with the patient and her 2 sons at bedside and have recommended lumbar puncture to exclude subarachnoid hemorrhage. They are all agreeable. I discussed this with the referring provider Melvenia Beam, [place]) who stated she will obtain a lumbar puncture--please send first and fourth bottles with  cell count and if possible check opening pressure. If positive then would consider CT angiogram. Syncope evaluation including 2-D echocardiogram and carotid Dopplers. 2. Hypertension: Stable. 3. History of vertigo 4. History of stroke  Code Status: Full code Family Communication: Discussed with sons Ron and Tim at bedside. Disposition Plan: Pending further evaluation.  Brendia Sacks, MD  [facility] Pager [phone] If 8PM-8AM, please contact floor/night-coverage at [email or web address], password [facility] 04/15/2012, 6:43 PM

## 2012-04-15 NOTE — ED Notes (Addendum)
Pt returned from radiology, ABC's intact, awaiting test results

## 2012-04-15 NOTE — ED Notes (Signed)
MD at bedside for lumbar puncture.

## 2012-04-15 NOTE — ED Provider Notes (Signed)
Lumbar puncture supervised by me. No apparent complications. CDU PA to place orders for CSF studies.  Forbes Cellar, MD 04/15/12 2121

## 2012-04-15 NOTE — ED Provider Notes (Signed)
History     CSN: [id number]  Arrival date & time 04/15/12  1324   First MD Initiated Contact with Patient 04/15/12 1348      Chief Complaint  Patient presents with  . Loss of Consciousness  . Fall    (Consider location/radiation/quality/duration/timing/severity/associated sxs/prior treatment) HPI Comments: Patient presents with head injury after having what sounds to be a syncopal episode on her porch this morning around 6 AM.  Patient states she walked out to her porch to give her newspaper to her neighbor when suddenly something fell wrong with her head, she loss control of her body and she fell bushes. She denies headache, chest pain, shortness of breath, abdominal pain. She is not certain if she loss consciousness. She reports no prodrome, dizziness, vertigo but she does have a history of vertigo and takes meclizine. She denies any nausea, vomiting, blurry vision, sweaty hands. Her sons took her to EMS station who recommended she come to ER. Her main complaint now is pain in the top of her head. She denies any visual change, chest pain, shortness of breath or palpitations.  Patient is a 78 y.o. female presenting with syncope and fall. The history is provided by the patient and a relative.  Loss of Consciousness Associated symptoms include headaches. Pertinent negatives include no chest pain, no abdominal pain and no shortness of breath.  Fall Associated symptoms include headaches. Pertinent negatives include no fever, no abdominal pain, no nausea and no vomiting.    Past Medical History  Diagnosis Date  . Lung tumor 1984    LUL  . HTN (hypertension)   . Stroke   . Chronic cough   . HLD (hyperlipidemia)     Past Surgical History  Procedure Date  . Back surgery   . Total abdominal hysterectomy 1947  . Lung surgery 1984    LUL   . Appendectomy 1938  . Breast biopsy     left    Family History  Problem Relation Age of Onset  . Diabetes Father   . Hearing loss    .  Hypertension Father   . Stroke Mother   . Tuberculosis Sister   . Emphysema Brother   . Lung cancer Brother   . Colon cancer Neg Hx   . Colon polyps Son   . Heart disease Father     History  Substance Use Topics  . Smoking status: Former Smoker -- 0.5 packs/day for 40 years    Types: Cigarettes    Quit date: 10/17/1982  . Smokeless tobacco: Never Used  . Alcohol Use: No    OB History    Grav Para Term Preterm Abortions TAB SAB Ect Mult Living                  Review of Systems  Constitutional: Negative for fever, activity change and appetite change.  HENT: Negative for congestion and rhinorrhea.   Eyes: Negative for visual disturbance.  Respiratory: Negative for cough, chest tightness and shortness of breath.   Cardiovascular: Positive for syncope. Negative for chest pain.  Gastrointestinal: Negative for nausea, vomiting and abdominal pain.  Genitourinary: Negative for dysuria.  Musculoskeletal: Negative for myalgias, back pain and arthralgias.  Neurological: Positive for syncope, weakness and headaches. Negative for dizziness and seizures.    Allergies  Lipitor; Nitrofurantoin monohyd macro; Penicillins; Plavix; Statins; and Sulfa antibiotics  Home Medications   Current Outpatient Rx  Name Route Sig Dispense Refill  . AMLODIPINE BESYLATE 5 MG PO TABS Oral  Take 5 mg by mouth daily.     Marland Kitchen MECLIZINE HCL PO Oral Take 1 tablet by mouth daily as needed. For vertigo      BP 173/74  Pulse 88  Temp 97.8 F (36.6 C) (Oral)  Resp 16  SpO2 98%  Physical Exam  Constitutional: She is oriented to person, place, and time. She appears well-developed and well-nourished. No distress.  HENT:  Head: Normocephalic and atraumatic.  Mouth/Throat: Oropharynx is clear and moist. No oropharyngeal exudate.  Eyes: Conjunctivae and EOM are normal. Pupils are equal, round, and reactive to light.  Neck: Normal range of motion. Neck supple.       No C-spine pain, step-off or deformity    Cardiovascular: Normal rate and normal heart sounds.   No murmur heard. Pulmonary/Chest: Effort normal and breath sounds normal. No respiratory distress.  Abdominal: Soft. There is no tenderness. There is no rebound and no guarding.  Musculoskeletal: Normal range of motion. She exhibits no edema and no tenderness.       Upper thoracic spinous process tenderness  Neurological: She is alert and oriented to person, place, and time. No cranial nerve deficit.  Skin: Skin is warm.    ED Course  Procedures (including critical care time)  Labs Reviewed  COMPREHENSIVE METABOLIC PANEL - Abnormal; Notable for the following:    GFR calc non Af Amer 65 (*)     GFR calc Af Amer 76 (*)     All other components within normal limits  CARDIAC PANEL(CRET KIN+CKTOT+MB+TROPI)  CBC WITH DIFFERENTIAL  URINALYSIS, ROUTINE W REFLEX MICROSCOPIC   Dg Chest 2 View  04/15/2012  *RADIOLOGY REPORT*  Clinical Data: Loss of consciousness  CHEST - 2 VIEW  Comparison: None  Findings: Heart size is normal.  No pleural effusion or edema.  No airspace consolidation.  Lungs are hyperinflated and there are coarsened interstitial changes suggestive of COPD.  Postoperative changes noted the left upper lobe.  IMPRESSION:  1.  No acute cardiopulmonary abnormalities.  Original Report Authenticated By: Rosealee Albee, M.D.   Ct Head Wo Contrast  04/15/2012  *RADIOLOGY REPORT*  Clinical Data: Loss of consciousness.  CT HEAD WITHOUT CONTRAST  Technique:  Contiguous axial images were obtained from the base of the skull through the vertex without contrast.  Comparison: Head CT 12/24/2009.  Findings: No acute displaced skull fractures are identified.  No acute intracranial abnormality.  Specifically, no definite evidence of acute/subacute cerebral ischemia, no signs of acute post- traumatic intracranial hemorrhage, no focal mass, mass effect, hydrocephalus or abnormal intra or extra-axial fluid collections. Old lacunar infarctions are  noted in the lateral aspect of the left thalamus, posterior limb of the right internal capsule, and anterior limb of the left internal capsule.  Cerebral and cerebellar atrophy is age appropriate.  There are extensive patchy and confluent areas of decreased attenuation throughout the deep and periventricular white matter of the cerebral hemispheres bilaterally, compatible with chronic microvascular ischemic changes.  Visualized paranasal sinuses and mastoids are well pneumatized.  IMPRESSION: 1.  No acute intracranial abnormalities. 2.  Cerebral and cerebellar atrophy with multiple old lacunar infarctions and chronic microvascular ischemic changes in the cerebral white matter, as above.  Original Report Authenticated By: Florencia Reasons, M.D.   Ct Angio Chest W/cm &/or Wo Cm  04/15/2012  *RADIOLOGY REPORT*  Clinical Data: Fall.  Loss of consciousness.  CT ANGIOGRAPHY CHEST  Technique:  Multidetector CT imaging of the chest using the standard protocol during bolus administration of  intravenous contrast. Multiplanar reconstructed images including MIPs were obtained and reviewed to evaluate the vascular anatomy.  Contrast: 80mL OMNIPAQUE IOHEXOL 350 MG/ML SOLN  Comparison: None  Findings: No axillary or supraclavicular adenopathy.  There is no mediastinal or hilar adenopathy. Saccular aneurysmal involving the aortic arch is identified measuring approximately 1.3 cm, image 25.  No mediastinal or hilar adenopathy present.  There is no pericardial or pleural effusion.  Calcifications involving the LAD coronary artery noted.  No abnormal filling defects within the main pulmonary artery or its branches are identified to suggest an acute pulmonary embolus.  There are dependent type changes identified in both lung bases.  No airspace consolidation or atelectasis.  No suspicious pulmonary parenchymal nodule or mass noted.  The bones are osteopenic.  There are no worrisome lytic or sclerotic bone lesions.  IMPRESSION:  1.   No evidence for acute pulmonary embolus or acute cardiopulmonary abnormality. 2.  Coronary artery calcifications. 3.  Focal aneurysm arises from the aortic arch  Original Report Authenticated By: Rosealee Albee, M.D.     No diagnosis found.    MDM  Head injury after apparent syncopal episode.  No neuro deficits, at baseline per son. Uncertain circumstances surrounding fall. Patient admits nonmechanical but cannot explain what happened.  No arrhythmias on EKG. Orthostatics negative. Troponin negative. CT PE pending given history of lung CA, upper back pain, and syncope. CT head pending.  PAC Narvaez and Dr. Hyman Hopes to assume patient care. Anticipate admission for unexplained syncope.      Date: 04/15/2012  Rate: 61  Rhythm: normal sinus rhythm  QRS Axis: normal  Intervals: PR prolonged  ST/T Wave abnormalities: normal  Conduction Disutrbances:none  Narrative Interpretation:   Old EKG Reviewed: unchanged    Glynn Octave, MD 04/15/12 [phone]

## 2012-04-15 NOTE — ED Notes (Signed)
Reports going outside this am and having possible syncopal episode, fell over into the hedges, hit head and neck on porch. Has bruising to back of head and abrasions noted to left arm.

## 2012-04-15 NOTE — ED Notes (Signed)
MD at bedside. 

## 2012-04-15 NOTE — ED Notes (Signed)
Pt in s/p syncopal episode while walking outside to get her newspaper, states she was walking and developed a sudden pain in her head and states she just felt out of it and that she didn't have control of her body, states she then fell and landed in the bushes and hit her head, hematoma noted to back of head, pt c/o pain when touching area, states other symptoms have improved at this time but that she still doesn't feel right, states she has a history of vertigo but has never had pain with this. No neuro deficits noted, pupils equal and reactive, no distress noted.

## 2012-04-15 NOTE — ED Notes (Signed)
Pt will transfer to CDU upon return from CT

## 2012-04-15 NOTE — ED Provider Notes (Signed)
History     CSN: [id number]  Arrival date & time 04/15/12  1324   First MD Initiated Contact with Patient 04/15/12 1348      Chief Complaint  Patient presents with  . Loss of Consciousness  . Fall    (Consider location/radiation/quality/duration/timing/severity/associated sxs/prior treatment) HPI  Past Medical History  Diagnosis Date  . Lung tumor 1984    LUL  . HTN (hypertension)   . Stroke   . Chronic cough   . HLD (hyperlipidemia)   . Vertigo     Past Surgical History  Procedure Date  . Back surgery   . Total abdominal hysterectomy 1947  . Lung surgery 1984    LUL   . Appendectomy 1938  . Breast biopsy     left    Family History  Problem Relation Age of Onset  . Diabetes Father   . Hearing loss    . Hypertension Father   . Stroke Mother   . Tuberculosis Sister   . Emphysema Brother   . Lung cancer Brother   . Colon cancer Neg Hx   . Colon polyps Son   . Heart disease Father     History  Substance Use Topics  . Smoking status: Former Smoker -- 0.5 packs/day for 40 years    Types: Cigarettes    Quit date: 10/17/1982  . Smokeless tobacco: Never Used  . Alcohol Use: No    OB History    Grav Para Term Preterm Abortions TAB SAB Ect Mult Living                  Review of Systems  Allergies  Lipitor; Nitrofurantoin monohyd macro; Penicillins; Plavix; Statins; and Sulfa antibiotics  Home Medications   Current Outpatient Rx  Name Route Sig Dispense Refill  . AMLODIPINE BESYLATE 5 MG PO TABS Oral Take 5 mg by mouth daily.     Marland Kitchen MECLIZINE HCL PO Oral Take 1 tablet by mouth daily as needed. For vertigo      BP 165/86  Pulse 62  Temp 97.8 F (36.6 C) (Oral)  Resp 11  SpO2 98%  Physical Exam  ED Course  LUMBAR PUNCTURE Date/Time: 04/15/2012 9:08 PM Performed by: Clemetine Marker Authorized by: Forbes Cellar Consent: Written consent obtained. Risks and benefits: risks, benefits and alternatives were discussed Consent given by:  patient Patient understanding: patient states understanding of the procedure being performed Patient consent: the patient's understanding of the procedure matches consent given Procedure consent: procedure consent matches procedure scheduled Relevant documents: relevant documents present and verified Test results: test results available and properly labeled Site marked: the operative site was marked Imaging studies: imaging studies available Patient identity confirmed: verbally with patient and arm band Time out: Immediately prior to procedure a "time out" was called to verify the correct patient, procedure, equipment, support staff and site/side marked as required. Indications: evaluation for subarachnoid hemorrhage and evaluation for altered mental status Anesthesia: local infiltration Local anesthetic: lidocaine 1% without epinephrine Anesthetic total: 3 ml Patient sedated: no Preparation: Patient was prepped and draped in the usual sterile fashion. Lumbar space: L3-L4 interspace Patient's position: sitting Needle gauge: 22 Needle type: spinal needle - Quincke tip Needle length: 2.5 in Number of attempts: 1 Fluid appearance: clear Tubes of fluid: 4 Total volume: 6 ml Post-procedure: site cleaned and adhesive bandage applied Patient tolerance: Patient tolerated the procedure well with no immediate complications.   (including critical care time)  Labs Reviewed  COMPREHENSIVE METABOLIC PANEL - Abnormal;  Notable for the following:    GFR calc non Af Amer 65 (*)     GFR calc Af Amer 76 (*)     All other components within normal limits  URINALYSIS, ROUTINE W REFLEX MICROSCOPIC - Abnormal; Notable for the following:    Specific Gravity, Urine 1.046 (*)     Ketones, ur 15 (*)     Leukocytes, UA MODERATE (*)     All other components within normal limits  URINE MICROSCOPIC-ADD ON - Abnormal; Notable for the following:    Squamous Epithelial / LPF FEW (*)     All other components  within normal limits  CARDIAC PANEL(CRET KIN+CKTOT+MB+TROPI)  CBC WITH DIFFERENTIAL   Dg Chest 2 View  04/15/2012  *RADIOLOGY REPORT*  Clinical Data: Loss of consciousness  CHEST - 2 VIEW  Comparison: None  Findings: Heart size is normal.  No pleural effusion or edema.  No airspace consolidation.  Lungs are hyperinflated and there are coarsened interstitial changes suggestive of COPD.  Postoperative changes noted the left upper lobe.  IMPRESSION:  1.  No acute cardiopulmonary abnormalities.  Original Report Authenticated By: Rosealee Albee, M.D.   Ct Head Wo Contrast  04/15/2012  *RADIOLOGY REPORT*  Clinical Data: Loss of consciousness.  CT HEAD WITHOUT CONTRAST  Technique:  Contiguous axial images were obtained from the base of the skull through the vertex without contrast.  Comparison: Head CT 12/24/2009.  Findings: No acute displaced skull fractures are identified.  No acute intracranial abnormality.  Specifically, no definite evidence of acute/subacute cerebral ischemia, no signs of acute post- traumatic intracranial hemorrhage, no focal mass, mass effect, hydrocephalus or abnormal intra or extra-axial fluid collections. Old lacunar infarctions are noted in the lateral aspect of the left thalamus, posterior limb of the right internal capsule, and anterior limb of the left internal capsule.  Cerebral and cerebellar atrophy is age appropriate.  There are extensive patchy and confluent areas of decreased attenuation throughout the deep and periventricular white matter of the cerebral hemispheres bilaterally, compatible with chronic microvascular ischemic changes.  Visualized paranasal sinuses and mastoids are well pneumatized.  IMPRESSION: 1.  No acute intracranial abnormalities. 2.  Cerebral and cerebellar atrophy with multiple old lacunar infarctions and chronic microvascular ischemic changes in the cerebral white matter, as above.  Original Report Authenticated By: Florencia Reasons, M.D.   Ct Angio  Chest W/cm &/or Wo Cm  04/15/2012  *RADIOLOGY REPORT*  Clinical Data: Fall.  Loss of consciousness.  CT ANGIOGRAPHY CHEST  Technique:  Multidetector CT imaging of the chest using the standard protocol during bolus administration of intravenous contrast. Multiplanar reconstructed images including MIPs were obtained and reviewed to evaluate the vascular anatomy.  Contrast: 80mL OMNIPAQUE IOHEXOL 350 MG/ML SOLN  Comparison: None  Findings: No axillary or supraclavicular adenopathy.  There is no mediastinal or hilar adenopathy. Saccular aneurysmal involving the aortic arch is identified measuring approximately 1.3 cm, image 25.  No mediastinal or hilar adenopathy present.  There is no pericardial or pleural effusion.  Calcifications involving the LAD coronary artery noted.  No abnormal filling defects within the main pulmonary artery or its branches are identified to suggest an acute pulmonary embolus.  There are dependent type changes identified in both lung bases.  No airspace consolidation or atelectasis.  No suspicious pulmonary parenchymal nodule or mass noted.  The bones are osteopenic.  There are no worrisome lytic or sclerotic bone lesions.  IMPRESSION:  1.  No evidence for acute pulmonary embolus or acute  cardiopulmonary abnormality. 2.  Coronary artery calcifications. 3.  Focal aneurysm arises from the aortic arch  Original Report Authenticated By: Rosealee Albee, M.D.     1. Syncope       MDM          Clemetine Marker, MD 04/15/12 2109

## 2012-04-15 NOTE — ED Notes (Signed)
Contacted lab to check on CMP results, states they are running the lab test now.

## 2012-04-16 ENCOUNTER — Observation Stay (HOSPITAL_COMMUNITY): Payer: Medicare Other

## 2012-04-16 DIAGNOSIS — R42 Dizziness and giddiness: Secondary | ICD-10-CM | POA: Diagnosis present

## 2012-04-16 DIAGNOSIS — R55 Syncope and collapse: Principal | ICD-10-CM

## 2012-04-16 DIAGNOSIS — I10 Essential (primary) hypertension: Secondary | ICD-10-CM | POA: Diagnosis present

## 2012-04-16 DIAGNOSIS — W19XXXA Unspecified fall, initial encounter: Secondary | ICD-10-CM

## 2012-04-16 DIAGNOSIS — R1310 Dysphagia, unspecified: Secondary | ICD-10-CM | POA: Diagnosis present

## 2012-04-16 DIAGNOSIS — G971 Other reaction to spinal and lumbar puncture: Secondary | ICD-10-CM | POA: Diagnosis not present

## 2012-04-16 DIAGNOSIS — Z8673 Personal history of transient ischemic attack (TIA), and cerebral infarction without residual deficits: Secondary | ICD-10-CM

## 2012-04-16 MED ORDER — MORPHINE SULFATE 2 MG/ML IJ SOLN: 1.0000 mg | INTRAMUSCULAR | Status: DC | PRN

## 2012-04-16 NOTE — Consult Note (Signed)
[facility] CONSULT [facility]     Reason for Consult: head fullness resulting in fall.     HPI:    Belinda Webb is an 78 y.o. female who awoke in am yesterday and took part in her normal routine.  Patient wlked out to her porch and bent down to get her paper and throw it in her next door neighbors yard.  When she stood up she felt "sever pressure in her head for 2-3 seconds, then her body went limp and she fell to the porch". She denies LOC and states she slowly got up and sat down.  She called her son who then called 911.  She was at baseline by time EMS arrived.  She denies, vertigo, diplopia, blurred vision, numbness tingling, difficulty speaking and loss of conscious.   Of note:  Patients son states (and patient agrees) she does not take her blood pressure every day or regular basis.   Past Medical History  Diagnosis Date  . Lung tumor 1984    LUL  . HTN (hypertension)   . Stroke   . Chronic cough   . HLD (hyperlipidemia)   . Vertigo     Past Surgical History  Procedure Date  . Back surgery   . Total abdominal hysterectomy 1947  . Lung surgery 1984    LUL   . Appendectomy 1938  . Breast biopsy     left    Family History  Problem Relation Age of Onset  . Diabetes Father   . Hearing loss    . Hypertension Father   . Stroke Mother   . Tuberculosis Sister   . Emphysema Brother   . Lung cancer Brother   . Colon cancer Neg Hx   . Colon polyps Son   . Heart disease Father     Social History:  reports that she quit smoking about 29 years ago. Her smoking use included Cigarettes. She has a 20 pack-year smoking history. She has never used smokeless tobacco. She reports that she does not drink alcohol or use illicit drugs.  Allergies  Allergen Reactions  . Lipitor (Atorvastatin Calcium) Other (See Comments)    Reaction unknown  . Nitrofurantoin Monohyd Macro Other (See Comments)    Reaction unknown  . Penicillins Other (See Comments)      Reaction unknown  . Plavix (Clopidogrel Bisulfate) Other (See Comments)    Reaction unknown  . Statins     REACTION: effects muscles  . Sulfa Antibiotics Other (See Comments)    Reaction unknown    Medications:    Prior to Admission:  Prescriptions prior to admission  Medication Sig Dispense Refill  . amLODipine (NORVASC) 5 MG tablet Take 5 mg by mouth daily.       . MECLIZINE HCL PO Take 1 tablet by mouth daily as needed. For vertigo       Scheduled:   . amLODipine  5 mg Oral Daily  . sodium chloride  3 mL Intravenous Q12H  . sodium chloride  3 mL Intravenous Q12H    Review of Systems - General ROS: negative for - chills, fatigue, fever or hot flashes Hematological and Lymphatic ROS: negative for - bruising, fatigue, jaundice or pallor Endocrine ROS: negative for - hair pattern changes, hot flashes, mood swings or skin changes Respiratory ROS: negative for - cough, hemoptysis, orthopnea or wheezing Cardiovascular ROS:  negative for - dyspnea on exertion, orthopnea, palpitations or shortness of breath Gastrointestinal ROS: negative for - abdominal pain, appetite loss, blood in stools, diarrhea or hematemesis Musculoskeletal ROS: negative for - joint pain, joint stiffness, joint swelling or muscle pain Neurological ROS: positive for - dizziness and weakness Dermatological ROS: positive for bruising  Blood pressure 128/60, pulse 65, temperature 96.7 F (35.9 C), temperature source Oral, resp. rate 18, height 5\' 3"  (1.6 m), weight 49.7 kg (109 lb 9.1 oz), SpO2 96.00%.   Neurologic Examination:     Lab Results  Component Value Date/Time   CHOL  Value: 197        ATP III CLASSIFICATION:  <200     mg/dL   Desirable  413-244  mg/dL   Borderline High  >=010    mg/dL   High        2/72/5366  4:53 AM    Results for orders placed during the hospital encounter of 04/15/12 (from the past 48 hour(s))  CARDIAC PANEL(CRET KIN+CKTOT+MB+TROPI)     Status: Normal   Collection Time    04/15/12  2:30 PM      Component Value Range Comment   Total CK 71  7 - 177 U/L    CK, MB 2.8  0.3 - 4.0 ng/mL    Troponin I <0.30  <0.30 ng/mL    Relative Index RELATIVE INDEX IS INVALID  0.0 - 2.5   CBC WITH DIFFERENTIAL     Status: Normal   Collection Time   04/15/12  2:30 PM      Component Value Range Comment   WBC 6.7  4.0 - 10.5 K/uL    RBC 4.28  3.87 - 5.11 MIL/uL    Hemoglobin 13.9  12.0 - 15.0 g/dL    HCT 44.0  34.7 - 42.5 %    MCV 97.7  78.0 - 100.0 fL    MCH 32.5  26.0 - 34.0 pg    MCHC 33.3  30.0 - 36.0 g/dL    RDW 95.6  38.7 - 56.4 %    Platelets 190  150 - 400 K/uL    Neutrophils Relative 67  43 - 77 %    Neutro Abs 4.5  1.7 - 7.7 K/uL    Lymphocytes Relative 21  12 - 46 %    Lymphs Abs 1.4  0.7 - 4.0 K/uL    Monocytes Relative 11  3 - 12 %    Monocytes Absolute 0.7  0.1 - 1.0 K/uL    Eosinophils Relative 1  0 - 5 %    Eosinophils Absolute 0.0  0.0 - 0.7 K/uL    Basophils Relative 0  0 - 1 %    Basophils Absolute 0.0  0.0 - 0.1 K/uL   COMPREHENSIVE METABOLIC PANEL     Status: Abnormal   Collection Time   04/15/12  2:30 PM      Component Value Range Comment   Sodium 139  135 - 145 mEq/L    Potassium 4.0  3.5 - 5.1 mEq/L    Chloride 102  96 - 112 mEq/L    CO2 22  19 - 32 mEq/L    Glucose, Bld 91  70 - 99 mg/dL    BUN 18  6 - 23 mg/dL    Creatinine, Ser 3.32  0.50 - 1.10 mg/dL    Calcium 9.2  8.4 - 95.1 mg/dL    Total Protein 6.7  6.0 - 8.3 g/dL    Albumin 3.8  3.5 - 5.2 g/dL    AST 29  0 - 37 U/L    ALT 17  0 - 35 U/L    Alkaline Phosphatase 83  39 - 117 U/L    Total Bilirubin 0.8  0.3 - 1.2 mg/dL    GFR calc non Af Amer 65 (*) >90 mL/min    GFR calc Af Amer 76 (*) >90 mL/min   URINALYSIS, ROUTINE W REFLEX MICROSCOPIC     Status: Abnormal   Collection Time   04/15/12  5:57 PM      Component Value Range Comment   Color, Urine YELLOW  YELLOW    APPearance CLEAR  CLEAR    Specific Gravity, Urine 1.046 (*) 1.005 - 1.030    pH 7.0  5.0 - 8.0    Glucose, UA  NEGATIVE  NEGATIVE mg/dL    Hgb urine dipstick NEGATIVE  NEGATIVE    Bilirubin Urine NEGATIVE  NEGATIVE    Ketones, ur 15 (*) NEGATIVE mg/dL    Protein, ur NEGATIVE  NEGATIVE mg/dL    Urobilinogen, UA 0.2  0.0 - 1.0 mg/dL    Nitrite NEGATIVE  NEGATIVE    Leukocytes, UA MODERATE (*) NEGATIVE   URINE MICROSCOPIC-ADD ON     Status: Abnormal   Collection Time   04/15/12  5:57 PM      Component Value Range Comment   Squamous Epithelial / LPF FEW (*) RARE    WBC, UA 3-6  <3 WBC/hpf   CSF CELL COUNT WITH DIFFERENTIAL     Status: Abnormal   Collection Time   04/15/12  9:14 PM      Component Value Range Comment   Tube # 1      Color, CSF COLORLESS  COLORLESS    Appearance, CSF CLEAR  CLEAR    Supernatant NOT INDICATED      RBC Count, CSF 226 (*) 0 /cu mm    WBC, CSF 4  0 - 5 /cu mm    Segmented Neutrophils-CSF TOO FEW TO COUNT, SMEAR AVAILABLE FOR REVIEW  0 - 6 % FEW   Lymphs, CSF FEW  40 - 80 %    Monocyte-Macrophage-Spinal Fluid OCCASIONAL  15 - 45 %    Eosinophils, CSF 0  0 - 1 %   CSF CELL COUNT WITH DIFFERENTIAL     Status: Abnormal   Collection Time   04/15/12  9:14 PM      Component Value Range Comment   Tube # 4      Color, CSF COLORLESS  COLORLESS    Appearance, CSF CLEAR  CLEAR    Supernatant NOT INDICATED      RBC Count, CSF 66 (*) 0 /cu mm    WBC, CSF 7 (*) 0 - 5 /cu mm    Segmented Neutrophils-CSF TOO FEW TO COUNT, SMEAR AVAILABLE FOR REVIEW  0 - 6 % OCCA   Lymphs, CSF FEW  40 - 80 %    Monocyte-Macrophage-Spinal Fluid FEW  15 - 45 %    Eosinophils, CSF 0  0 - 1 %   PATHOLOGIST SMEAR REVIEW     Status: Normal   Collection Time   04/15/12  9:14 PM      Component Value Range Comment   Tech Review Bone marrow elements present       Dg Chest 2 View  04/15/2012  *RADIOLOGY REPORT*  Clinical Data: Loss of consciousness  CHEST - 2 VIEW  Comparison: None  Findings: Heart size is  normal.  No pleural effusion or edema.  No airspace consolidation.  Lungs are hyperinflated and  there are coarsened interstitial changes suggestive of COPD.  Postoperative changes noted the left upper lobe.  IMPRESSION:  1.  No acute cardiopulmonary abnormalities.  Original Report Authenticated By: Rosealee Albee, M.D.   Ct Head Wo Contrast  04/15/2012  *RADIOLOGY REPORT*  Clinical Data: Loss of consciousness.  CT HEAD WITHOUT CONTRAST  Technique:  Contiguous axial images were obtained from the base of the skull through the vertex without contrast.  Comparison: Head CT 12/24/2009.  Findings: No acute displaced skull fractures are identified.  No acute intracranial abnormality.  Specifically, no definite evidence of acute/subacute cerebral ischemia, no signs of acute post- traumatic intracranial hemorrhage, no focal mass, mass effect, hydrocephalus or abnormal intra or extra-axial fluid collections. Old lacunar infarctions are noted in the lateral aspect of the left thalamus, posterior limb of the right internal capsule, and anterior limb of the left internal capsule.  Cerebral and cerebellar atrophy is age appropriate.  There are extensive patchy and confluent areas of decreased attenuation throughout the deep and periventricular white matter of the cerebral hemispheres bilaterally, compatible with chronic microvascular ischemic changes.  Visualized paranasal sinuses and mastoids are well pneumatized.  IMPRESSION: 1.  No acute intracranial abnormalities. 2.  Cerebral and cerebellar atrophy with multiple old lacunar infarctions and chronic microvascular ischemic changes in the cerebral white matter, as above.  Original Report Authenticated By: Florencia Reasons, M.D.   Ct Angio Chest W/cm &/or Wo Cm  04/15/2012  *RADIOLOGY REPORT*  Clinical Data: Fall.  Loss of consciousness.  CT ANGIOGRAPHY CHEST  Technique:  Multidetector CT imaging of the chest using the standard protocol during bolus administration of intravenous contrast. Multiplanar reconstructed images including MIPs were obtained and reviewed  to evaluate the vascular anatomy.  Contrast: 80mL OMNIPAQUE IOHEXOL 350 MG/ML SOLN  Comparison: None  Findings: No axillary or supraclavicular adenopathy.  There is no mediastinal or hilar adenopathy. Saccular aneurysmal involving the aortic arch is identified measuring approximately 1.3 cm, image 25.  No mediastinal or hilar adenopathy present.  There is no pericardial or pleural effusion.  Calcifications involving the LAD coronary artery noted.  No abnormal filling defects within the main pulmonary artery or its branches are identified to suggest an acute pulmonary embolus.  There are dependent type changes identified in both lung bases.  No airspace consolidation or atelectasis.  No suspicious pulmonary parenchymal nodule or mass noted.  The bones are osteopenic.  There are no worrisome lytic or sclerotic bone lesions.  IMPRESSION:  1.  No evidence for acute pulmonary embolus or acute cardiopulmonary abnormality. 2.  Coronary artery calcifications. 3.  Focal aneurysm arises from the aortic arch  Original Report Authenticated By: Rosealee Albee, M.D.     Assessment/Plan:   78 YO female with history of HTN,  Chronic vertigo (managed) and medication noncompliance.  Patient suffered from acute transient "head pressure" which resulted in fall but no loss of consciousness (that patient can remember).  BP at time of even is unknown however patient shows significant orthostatic blood pressure her in hospital. SBP 153 to 137 and HR increase from 61-88.  Patients CT head shows no acute infarct but does show multiple old lacunar infarct bilaterally correlating with hypertension and small vessel disease. Do to short duration and rapid resolution not likely seizure.  Must consider orthostatic syncopal event given documented orthostatics fact patient had bent over to get paper.   Recommend:  1) PT evaluation 2) Compression stockings for better venous return, and education on transferring and getting up safely to  avoid syncope. 3) Agree with Echocardiogram to look at [facility] and myocardial motility 4) MRI/MRA brain to evaluate intracranial pathology.    Felicie Morn PA-C [facility] [phone]  04/16/2012, 11:12 AM

## 2012-04-16 NOTE — Progress Notes (Signed)
Utilization review complete 

## 2012-04-16 NOTE — Progress Notes (Signed)
[facility] PROGRESS NOTE  Belinda Webb ZOX:[id number] DOB: 76-15-1926 DOA: 04/15/2012 PCP: Rudi Heap, MD  Assessment/Plan: 1. Fall: Etiology unclear. No focal neurologic symptoms. Differential includes syncope (vasovagal or vertiginous). Stroke or arrhythmia seems unlikely based on history and examination. CT of the chest was negative for pulmonary embolism. Her history is somewhat concerning for subarachnoid hemorrhage however lumbar puncture was suggestive of traumatic tap only. Syncope evaluation including 2-D echocardiogram and carotid Dopplers. Neurology consultation pending. 2. Spinal headache: Morphine, Tylenol as needed. 3. Chronic dysphagia: Patient has had multiple evaluations including upper GI, barium swallow and endoscopy. This is a long-standing issue and is worsened by activity. 4. Hypertension: Stable. 5. History of vertigo: Meclizine as needed. 6. History of stroke: Currently not on any antiplatelet agents. Consider aspirin.  Code Status: Full code  Family Communication: Discussed with son at bedside. Disposition Plan: Pending further evaluation.  Brendia Sacks, MD  [facility] Pager [phone]. If 8PM-8AM, please contact night-coverage at [email or web address], password [facility] 04/16/2012, 9:25 AM  LOS: 1 day   Brief narrative: 78 year old woman with a history of chronic vertigo presented to the emergency department today with a history of a fall. Etiology unclear.  Consultants:  Neurology  Procedures:  Lumbar puncture  HPI/Subjective: No neurologic events overnight. Developed spinal headache this morning.  Objective: Filed Vitals:   04/15/12 2256 04/16/12 0142 04/16/12 0553 04/16/12 0821  BP: 167/71 116/59 128/60   Pulse:  77 65   Temp:  97.3 F (36.3 C) 97.4 F (36.3 C) 96.7 F (35.9 C)  TempSrc:  Oral Oral   Resp:  16 18   Height:      Weight:   49.7 kg (109 lb 9.1 oz)   SpO2:  97% 96%     Intake/Output Summary (Last 24 hours)  at 04/16/12 0925 Last data filed at 04/16/12 0558  Gross per 24 hour  Intake      0 ml  Output    151 ml  Net   -151 ml   Exam:   General:  Appears calm and mildly uncomfortable. Nontoxic.  Cardiovascular: Regular rate and rhythm. No murmur, rub, gallop. No lower extremity edema.  Telemetry: Sinus rhythm. No acute changes.  Respiratory: Clear to auscultation bilaterally. No wheezes, rales, rhonchi. Normal respiratory effort.   Abdomen: Soft, nontender, nondistended.  Data Reviewed:   Studies:  cerebrospinal fluid studies reviewed.   Scheduled Meds:   . amLODipine  5 mg Oral Daily  . sodium chloride  3 mL Intravenous Q12H  . sodium chloride  3 mL Intravenous Q12H   Continuous Infusions:   Principal Problem:  *Fall Active Problems:  Syncope  Spinal headache  Vertigo  Dysphagia  Hypertension  History of stroke

## 2012-04-16 NOTE — Progress Notes (Signed)
VASCULAR LAB PRELIMINARY  PRELIMINARY  PRELIMINARY  PRELIMINARY  Carotid Dopplers completed.    Preliminary report:  There is no significant ICA stenosis.  Vertebral artery flow is antegrade.  Belinda Webb, 04/16/2012, 3:25 PM

## 2012-04-16 NOTE — Evaluation (Signed)
Physical Therapy Evaluation Patient Details Name: Belinda Webb MRN: [id number] DOB: 06/18/1925 Today's Date: 04/16/2012 Time: 6578-4696 PT Time Calculation (min): 27 min  PT Assessment / Plan / Recommendation Clinical Impression  Pt is an 78 y/o very active female with history of vertigo. Pt will be followed by PT to maximize safety with mobility and for higher level balance training.      PT Assessment  Patient needs continued PT services    Follow Up Recommendations  Supervision - Intermittent    Barriers to Discharge        Equipment Recommendations  None recommended by PT    Recommendations for Other Services     Frequency Min 3X/week    Precautions / Restrictions Precautions Precautions: Fall Restrictions Weight Bearing Restrictions: No   Pertinent Vitals/Pain No c/o pain.       Mobility  Bed Mobility Bed Mobility: Supine to Sit Supine to Sit: 5: Supervision;HOB flat Details for Bed Mobility Assistance: supervision for safety.  Transfers Transfers: Sit to Stand;Stand to Sit Sit to Stand: 5: Supervision;With upper extremity assist;From bed Stand to Sit: 5: Supervision;To chair/3-in-1;With upper extremity assist Details for Transfer Assistance: verbal cues for hand placement.  Ambulation/Gait Ambulation/Gait Assistance: 4: Min guard Ambulation Distance (Feet): 100 Feet Assistive device: None Ambulation/Gait Assistance Details: pt a little unsteady mild LOB x 2 during session.  Gait Pattern: Within Functional Limits Stairs: No Wheelchair Mobility Wheelchair Mobility: No    Exercises     PT Diagnosis: Difficulty walking  PT Problem List: Decreased balance PT Treatment Interventions: Gait training;Stair training;Functional mobility training;Therapeutic activities;Therapeutic exercise;Balance training;Neuromuscular re-education;Patient/family education   PT Goals Acute Rehab PT Goals PT Goal Formulation: With patient Time For Goal Achievement:  04/23/12 Potential to Achieve Goals: Fair Pt will Transfer Bed to Chair/Chair to Bed: Independently PT Transfer Goal: Bed to Chair/Chair to Bed - Progress: Goal set today Pt will Ambulate: >150 feet;Independently PT Goal: Ambulate - Progress: Goal set today Pt will Go Up / Down Stairs: 3-5 stairs;Independently;with least restrictive assistive device PT Goal: Up/Down Stairs - Progress: Goal set today  Visit Information  Last PT Received On: 04/16/12    Subjective Data  Subjective: I need to get back to my aerobics class Patient Stated Goal: Return to aerobics and Yoga.    Prior Functioning  Home Living Lives With: Alone Available Help at Discharge: Neighbor Type of Home: House Home Access: Level entry Home Layout: One level Bathroom Shower/Tub: Forensic scientist: Standard Bathroom Accessibility: Yes How Accessible: Accessible via walker Home Adaptive Equipment: Sock aid;Walker - rolling;Walker - four wheeled Prior Function Level of Independence: Independent Able to Take Stairs?: Yes Driving: Yes Vocation: Retired Musician: [employer] Dominant Hand: Right    Cognition  Overall Cognitive Status: Appears within functional limits for tasks assessed/performed Arousal/Alertness: Awake/alert Orientation Level: Oriented X4 / Intact Behavior During Session: WFL for tasks performed    Extremity/Trunk Assessment Right Upper Extremity Assessment RUE ROM/Strength/Tone: Within functional levels Left Upper Extremity Assessment LUE ROM/Strength/Tone: Within functional levels Right Lower Extremity Assessment RLE ROM/Strength/Tone: Within functional levels Left Lower Extremity Assessment LLE ROM/Strength/Tone: Within functional levels   Balance Balance Balance Assessed: Yes High Level Balance High Level Balance Activites: Sudden stops;Head turns;Direction changes;Turns High Level Balance Comments: Pt c/o mild dizziness with head turns.    End of  Session PT - End of Session Equipment Utilized During Treatment: Gait belt Activity Tolerance: Patient tolerated treatment well Patient left: in chair;with call bell/phone within reach;with family/visitor present Nurse  Communication: Mobility status  GP     Brahm Barbeau 04/16/2012, 5:45 PM  Yousuf Ager L. Callie Facey DPT [phone]

## 2012-04-16 NOTE — Evaluation (Signed)
Clinical/Bedside Swallow Evaluation Patient Details  Name: Belinda Webb MRN: [id number] Date of Birth: [birth year]  Today's Date: 04/16/2012 Time: 4098-1191 SLP Time Calculation (min): 17 min  Past Medical History:  Past Medical History  Diagnosis Date  . Lung tumor 1984    LUL  . HTN (hypertension)   . Stroke   . Chronic cough   . HLD (hyperlipidemia)   . Vertigo    Past Surgical History:  Past Surgical History  Procedure Date  . Back surgery   . Total abdominal hysterectomy 1947  . Lung surgery 1984    LUL   . Appendectomy 1938  . Breast biopsy     left   HPI:  78 year old female presented s/p fall. Head CT negative. PMH significant for esophageal dysphagia with most recent barium swallow 10/19/11 indicating hiatal hernia, GERD, esophageal dysmotility, amd spurring at C5-6 with mild impression on cervical esophagus. MBS complete 11/24/11 indicated a largely normal oropharyngeal swallow with recommendations for a regular diet, reflux precautions. Patient currently utilizing strategies provided during that study independently.     Assessment / Plan / Recommendation Clinical Impression  Swallowing function consistent with results of most recent MBS in which patient's oropharyngeal swallow was largely [facility] without penetration or aspiration observed. Patient continues to present with habitual/chronic appearing throat clear both with and without po intake and c/o globus with pos consistent with h/o esophageal deficits. At this time, patient appears to be protecting her airway with regular solids and thin liquids and is utilizing reflux precautions and compensatory strategies independently. May initiate a regular diet. No further skilled SLP intervention required at this time.     Aspiration Risk  Mild    Diet Recommendation Regular;Thin liquid   Liquid Administration via: Cup;Straw Medication Administration: Whole meds with liquid Supervision: Patient able to self feed;Intermittent  supervision to cue for compensatory strategies Compensations: Slow rate;Small sips/bites;Follow solids with liquid Postural Changes and/or Swallow Maneuvers: Seated upright 90 degrees;Upright 30-60 min after meal    Other  Recommendations Oral Care Recommendations: Oral care BID   Follow Up Recommendations  None       Pertinent Vitals/Pain n/a      Swallow Study    General HPI: 78 year old female presented s/p fall. Head CT negative. PMH significant for esophageal dysphagia with most recent barium swallow 10/19/11 indicating hiatal hernia, GERD, esophageal dysmotility, amd spurring at C5-6 with mild impression on cervical esophagus. MBS complete 11/24/11 indicated a largely normal oropharyngeal swallow with recommendations for a regular diet, reflux precautions. Patient currently utilizing strategies provided during that study independently.   Type of Study: Bedside swallow evaluation Previous Swallow Assessment: see clinical impression statement Diet Prior to this Study: NPO Temperature Spikes Noted: No Respiratory Status: Room air History of Recent Intubation: No Behavior/Cognition: Alert;Cooperative;Pleasant mood Oral Cavity - Dentition: Adequate natural dentition Self-Feeding Abilities: Able to feed self Patient Positioning: Upright in bed Baseline Vocal Quality: Clear Volitional Cough: Strong Volitional Swallow: Able to elicit    Oral/Motor/Sensory Function Overall Oral Motor/Sensory Function: Appears within functional limits for tasks assessed   Ice Chips Ice chips: Not tested   Thin Liquid Thin Liquid: Within functional limits Presentation: Cup;Straw    Nectar Thick Nectar Thick Liquid: Not tested   Honey Thick Honey Thick Liquid: Not tested   Puree Puree: Within functional limits Presentation: Self Fed;Spoon   Solid Solid: Within functional limits Presentation: Self [employer] MA, CCC-SLP [phone]  Maddie Brazier Meryl 04/16/2012,11:21 AM

## 2012-04-17 DIAGNOSIS — R42 Dizziness and giddiness: Secondary | ICD-10-CM

## 2012-04-17 MED ORDER — POLYETHYLENE GLYCOL 3350 17 G PO PACK
17.0000 g | PACK | Freq: Every day | ORAL | Status: DC
  Administered 2012-04-17: 17 g via ORAL
  Filled 2012-04-17: qty 1

## 2012-04-17 MED ORDER — MAGNESIUM HYDROXIDE 400 MG/5ML PO SUSP: 15.0000 mL | Freq: Every day | ORAL | Status: DC | PRN

## 2012-04-17 NOTE — Progress Notes (Signed)
[facility] PROGRESS NOTE  Belinda Webb ZOX:[id number] DOB: 02-Oct-1925 DOA: 04/15/2012 PCP: Rudi Heap, MD  Assessment/Plan: 1. Fall: Etiology unclear. No focal neurologic symptoms. Differential includes syncope (vasovagal, orthostatic or vertiginous). Stroke has been ruled out and telemetry is without arrhythmia. CT of the chest was negative for pulmonary embolism. Her history was somewhat concerning for subarachnoid hemorrhage however lumbar puncture was suggestive of traumatic tap only. Carotid Dopplers negative. Echocardiogram could not be completed prior to discharge. Neurology evaluation appreciated-lower extremity stockings, slow transitions.- 2. Spinal headache: Resolved.  3. Chronic dysphagia: Patient has had multiple evaluations including upper GI, barium swallow and endoscopy. This is a long-standing issue and is worsened by activity. 4. Hypertension: Stable. 5. History of vertigo: Meclizine as needed. 6. History of stroke: Currently not on any antiplatelet agents. Consider aspirin.  Code Status: Full code  Family Communication: Discussed with son at bedside. Disposition Plan: Home today  Brendia Sacks, MD  [facility] Pager [phone]. If 8PM-8AM, please contact night-coverage at [email or web address], password [facility] 04/17/2012, 10:46 AM  LOS: 2 days   Brief narrative: 78 year old woman with a history of chronic vertigo presented to the emergency department today with a history of a fall. Etiology unclear.  Consultants:  Neurology  Speech therapy--regular diet, thin liquids  Physical Therapy--No PT needs  Procedures:  Lumbar puncture--studies unremarkable   Bilateral carotid dopplers--No ICA stenosis.  2-D Echocardiogram--Deferred to the outpatient setting  HPI/Subjective: Feels better. Headache resolved. Walking the halls without difficulty.   Objective: Filed Vitals:   04/16/12 2132 04/17/12 0232 04/17/12 0530 04/17/12 1013  BP: 121/54  122/62 127/55 128/75  Pulse: 66 63 62 93  Temp: 98 F (36.7 C) 98.4 F (36.9 C) 97.4 F (36.3 C)   TempSrc: Oral Oral Oral   Resp: 18 20 20    Height:      Weight:   50.3 kg (110 lb 14.3 oz)   SpO2: 98% 97% 98%     Intake/Output Summary (Last 24 hours) at 04/17/12 1046 Last data filed at 04/17/12 1013  Gross per 24 hour  Intake    363 ml  Output    150 ml  Net    213 ml   Exam:   General:  Appears calm and mildly uncomfortable. Nontoxic.  Cardiovascular: Regular rate and rhythm. No murmur, rub, gallop. No lower extremity edema.  Telemetry: Sinus rhythm. No acute changes.  Respiratory: Clear to auscultation bilaterally. No wheezes, rales, rhonchi. Normal respiratory effort.     Ct Head Wo Contrast  04/15/2012  *RADIOLOGY REPORT*  Clinical Data: Loss of consciousness.  CT HEAD WITHOUT CONTRAST  Technique:  Contiguous axial images were obtained from the base of the skull through the vertex without contrast.  Comparison: Head CT 12/24/2009.  Findings: No acute displaced skull fractures are identified.  No acute intracranial abnormality.  Specifically, no definite evidence of acute/subacute cerebral ischemia, no signs of acute post- traumatic intracranial hemorrhage, no focal mass, mass effect, hydrocephalus or abnormal intra or extra-axial fluid collections. Old lacunar infarctions are noted in the lateral aspect of the left thalamus, posterior limb of the right internal capsule, and anterior limb of the left internal capsule.  Cerebral and cerebellar atrophy is age appropriate.  There are extensive patchy and confluent areas of decreased attenuation throughout the deep and periventricular white matter of the cerebral hemispheres bilaterally, compatible with chronic microvascular ischemic changes.  Visualized paranasal sinuses and mastoids are well pneumatized.  IMPRESSION: 1.  No acute intracranial abnormalities. 2.  Cerebral  and cerebellar atrophy with multiple old lacunar infarctions  and chronic microvascular ischemic changes in the cerebral white matter, as above.  Original Report Authenticated By: Florencia Reasons, M.D.   Ct Angio Chest W/cm &/or Wo Cm  04/15/2012  *RADIOLOGY REPORT*  Clinical Data: Fall.  Loss of consciousness.  CT ANGIOGRAPHY CHEST  Technique:  Multidetector CT imaging of the chest using the standard protocol during bolus administration of intravenous contrast. Multiplanar reconstructed images including MIPs were obtained and reviewed to evaluate the vascular anatomy.  Contrast: 80mL OMNIPAQUE IOHEXOL 350 MG/ML SOLN  Comparison: None  Findings: No axillary or supraclavicular adenopathy.  There is no mediastinal or hilar adenopathy. Saccular aneurysmal involving the aortic arch is identified measuring approximately 1.3 cm, image 25.  No mediastinal or hilar adenopathy present.  There is no pericardial or pleural effusion.  Calcifications involving the LAD coronary artery noted.  No abnormal filling defects within the main pulmonary artery or its branches are identified to suggest an acute pulmonary embolus.  There are dependent type changes identified in both lung bases.  No airspace consolidation or atelectasis.  No suspicious pulmonary parenchymal nodule or mass noted.  The bones are osteopenic.  There are no worrisome lytic or sclerotic bone lesions.  IMPRESSION:  1.  No evidence for acute pulmonary embolus or acute cardiopulmonary abnormality. 2.  Coronary artery calcifications. 3.  Focal aneurysm arises from the aortic arch  Original Report Authenticated By: Rosealee Albee, M.D.   Mr Brain Wo Contrast  04/16/2012  *RADIOLOGY REPORT*  Clinical Data: Chronic vertigo.  The patient fell yesterday. Syncope.  MRI HEAD WITHOUT CONTRAST  Technique:  Multiplanar, multiecho pulse sequences of the brain and surrounding structures were obtained according to standard protocol without intravenous contrast.  Comparison: CT head without contrast 04/15/2012.  MRI brain without  contrast 12/24/2009.  Findings: No acute infarct, hemorrhage, or mass lesion is present. Periventricular  and subcortical white matter disease is not significantly changed from prior exam.  Moderate generalized atrophy is stable as well.  There are remote lacunar infarcts of the basal ganglia bilaterally.  Remote lacunar infarction also noted in the left mid brain.  Flow is present within the major intracranial arteries.  The patient is status post bilateral lens extractions.  The globes and orbits are otherwise intact.  The paranasal sinuses are clear. Minimal fluid is present the mastoid air cells bilaterally.  No obstructing nasopharyngeal lesion is evident.  IMPRESSION:  1.  No acute intracranial abnormality or significant interval change. 2.  The stable atrophy and white matter disease. 3.  Remote lacunar infarcts of the basal ganglia bilaterally are stable. 4.  Minimal fluid in the mastoid air cells bilaterally.  No obstructing nasopharyngeal lesion is evident.  Original Report Authenticated By: Jamesetta Orleans. MATTERN, M.D.    Scheduled Meds:    . amLODipine  5 mg Oral Daily  . sodium chloride  3 mL Intravenous Q12H  . sodium chloride  3 mL Intravenous Q12H   Continuous Infusions:   Principal Problem:  *Fall Active Problems:  Syncope  Spinal headache  Vertigo  Dysphagia  Hypertension  History of stroke

## 2012-04-17 NOTE — Progress Notes (Signed)
PT Cancellation Note  Treatment cancelled today due to Pt and son report pt ambulating in halls with son surpervision. Pt to d/c home today.  Pt wearing shoes and feels balance problems resolved.  Pt reports no further need for PT.  Acute Pt signing off per pt/family request.  Reviewed safety precautions with pt.  Marland Kitchen  Belinda Webb 04/17/2012, 4:07 PM

## 2012-04-17 NOTE — Progress Notes (Signed)
DC IV, DC Tele, DC Home. Discharge instructions and home medications discussed with patient and patient's family. Patient denied any questions or concerns at this time. Patient leaving unit via wheelchair and appears in no acute distress.

## 2012-04-17 NOTE — Evaluation (Signed)
Occupational Therapy Evaluation Patient Details Name: Belinda Webb MRN: [id number] DOB: July 01, 1925 Today's Date: 04/17/2012 Time: 4782-9562 OT Time Calculation (min): 35 min  OT Assessment / Plan / Recommendation Clinical Impression  78 yr old female aldmitted after fall and syncipal episode.  Currrently presents at a independent/modified independent level with selfcare and ADL tasks.  Her son plans to put grab bars in next to her toilet and in her tub which will help with safety.  Also feel a shower seat would benefit her but she reports that she's "too stubborn" and won't use it.  No further OT needs or follow-up needed.    OT Assessment  Patient does not need any further OT services    Follow Up Recommendations  No OT follow up       Equipment Recommendations  None recommended by OT          Precautions / Restrictions Precautions Precautions: Fall Restrictions Weight Bearing Restrictions: No   Pertinent Vitals/Pain 95% on room air for O2    ADL  Eating/Feeding: Simulated;Independent Where Assessed - Eating/Feeding: Edge of bed Grooming: Simulated;Independent Where Assessed - Grooming: Unsupported standing Upper Body Bathing: Simulated;Modified independent Where Assessed - Upper Body Bathing: Supported standing Lower Body Bathing: Simulated;Modified independent Where Assessed - Lower Body Bathing: Supported sit to stand Upper Body Dressing: Simulated;Independent Where Assessed - Upper Body Dressing: Unsupported sitting Lower Body Dressing: Simulated;Modified independent Where Assessed - Lower Body Dressing: Unsupported sit to stand Toilet Transfer: Simulated;Modified independent Toilet Transfer Method: Other (comment) (Ambulating) Toilet Transfer Equipment: Comfort height toilet Toileting - Clothing Manipulation and Hygiene: Simulated;Modified independent Where Assessed - Toileting Clothing Manipulation and Hygiene: Sit to stand from 3-in-1 or toilet Tub/Shower  Transfer: Simulated;Modified independent Tub/Shower Transfer Method: Science writer: Grab bars Transfers/Ambulation Related to ADLs: Pt is overall modified independent with mobilty.  Takes slightly increased time but without any assistive device needed. ADL Comments: Pt overall modified independent with simulated selfcare tasks and functional transfers.  Pt's son present and he plans to put grab bars in next to the toilet and in the shower.  Suggested seat for the shower as well but she reports she's stubborn and will not use it now. No further OT needs.       Visit Information  Last OT Received On: 04/17/12 Assistance Needed: +1    Subjective Data  Subjective: " I want to get back to my activities." Patient Stated Goal: Get back to exercising and get ready for the senior olympics.   Prior Functioning  Home Living Lives With: Alone Available Help at Discharge: Neighbor Type of Home: House Home Access: Level entry Home Layout: One level Bathroom Shower/Tub: Forensic scientist: Standard Bathroom Accessibility: Yes How Accessible: Accessible via walker Home Adaptive Equipment: Sock aid;Walker - rolling;Walker - four wheeled Prior Function Level of Independence: Independent Able to Take Stairs?: Yes Driving: Yes Vocation: Retired Musician: HOH Dominant Hand: Right    Cognition  Overall Cognitive Status: Appears within functional limits for tasks assessed/performed Arousal/Alertness: Awake/alert Orientation Level: Appears intact for tasks assessed Behavior During Session: [facility] for tasks performed    Extremity/Trunk Assessment Right Upper Extremity Assessment RUE ROM/Strength/Tone: Within functional levels RUE Sensation: WFL - Light Touch RUE Coordination: WFL - gross/fine motor Left Upper Extremity Assessment LUE ROM/Strength/Tone: Within functional levels LUE Sensation: WFL - Light Touch LUE Coordination:  WFL - gross/fine motor Trunk Assessment Trunk Assessment: Normal   Mobility Bed Mobility Bed Mobility: Supine to Sit Supine to Sit:  6: Modified independent (Device/Increase time);HOB flat Transfers Transfers: Sit to Stand Sit to Stand: 6: Modified independent (Device/Increase time);With upper extremity assist;From bed;From toilet Stand to Sit: 6: Modified independent (Device/Increase time);To toilet      Balance Balance Balance Assessed: Yes Static Standing Balance Static Standing - Balance Support: No upper extremity supported Static Standing - Level of Assistance: 7: Independent Dynamic Standing Balance Dynamic Standing - Balance Support: No upper extremity supported Dynamic Standing - Level of Assistance: 6: Modified independent (Device/Increase time)  End of Session OT - End of Session Activity Tolerance: Patient tolerated treatment well Patient left: in chair;with call bell/phone within reach;with family/visitor present  GO Functional Assessment Tool Used: Clinical Judgement Functional Limitation: Self care Self Care Current Status (Z6109): 0 percent impaired, limited or restricted Self Care Goal Status (U0454): 0 percent impaired, limited or restricted Self Care Discharge Status (U9811): 0 percent impaired, limited or restricted   Espiridion Supinski OTR/L 04/17/2012, 12:16 PM Pager number [phone]

## 2012-04-17 NOTE — Discharge Summary (Signed)
Physician Discharge Summary  CHERICA HEIDEN OZH:[id number] DOB: 12-May-1925 DOA: 04/15/2012  PCP: Rudi Heap, MD  Admit date: 04/15/2012 Discharge date: 04/17/2012  Recommendations for Outpatient Follow-up:  1. Fall. 2. Consider outpatient 2-D echocardiogram for history of fall, cannot rule-out syncope.   Follow-up Information    Follow up with Rudi Heap, MD in 2 weeks.   Contact information:   [address] [place] [address] [phone]         Discharge Diagnoses:  1. Fall 2. Spinal headache 3. Chronic dysphagia 4. Hypertension 5. History of vertigo 6. History of stroke  Discharge Condition: Improved Disposition: Home  Diet recommendation: Regular  History of present illness:  78 year old woman with a history of chronic vertigo presented to the emergency department with a history of a fall. Etiology unclear.  Hospital Course:  Ms. Steinmiller was admitted to the medical floor for further evaluation. Lumbar puncture was unremarkable as was imaging of the head. Carotid Dopplers were negative. Echocardiogram could not be completed prior to discharge and has been deferred to the outpatient setting. Patient said no signs or symptoms to suggest a cardiac etiology. Patient was seen in consultation with neurology. No definitive conclusions been reached as to the etiology of her fall her symptoms have resolved and she is stable for discharge. Most likely it was vasovagal or orthostatic syncope. Chronic comorbidities have remained stable. 1. Fall: Etiology unclear. No focal neurologic symptoms. Differential includes syncope (vasovagal, orthostatic or vertiginous). Stroke has been ruled out and telemetry is without arrhythmia. CT of the chest was negative for pulmonary embolism. Her history was somewhat concerning for subarachnoid hemorrhage however lumbar puncture was suggestive of traumatic tap only. Carotid Dopplers negative. Echocardiogram could not be completed  prior to discharge. Neurology evaluation appreciated--recommended lower extremity stockings, slow transitions. 2. Spinal headache: Resolved.   3. Chronic dysphagia: Patient has had multiple evaluations including upper GI, barium swallow and endoscopy. This is a long-standing issue and is worsened by activity.  4. Hypertension: Stable.  5. History of vertigo: Meclizine as needed.  6. History of stroke: Currently not on any antiplatelet agents. Consider aspirin.  Consultants:  Neurology   Speech therapy--regular diet, thin liquids   Physical Therapy--No PT needs  Procedures:  Lumbar puncture--studies unremarkable     Bilateral carotid dopplers--No ICA stenosis.   2-D Echocardiogram--Deferred to the outpatient setting  Discharge Instructions  Discharge Orders    Future Orders Please Complete By Expires   Diet - low sodium heart healthy      Activity as tolerated - No restrictions      Discharge instructions      Comments:   Wear support hose as able. Make slow transitions from sitting to standing and lying to sitting.     Medication List  As of 04/17/2012  3:31 PM   TAKE these medications         amLODipine 5 MG tablet   Commonly known as: NORVASC   Take 5 mg by mouth daily.      MECLIZINE HCL PO   Take 1 tablet by mouth daily as needed. For vertigo           The results of significant diagnostics from this hospitalization (including imaging, microbiology, ancillary and laboratory) are listed below for reference.    Significant Diagnostic Studies: Dg Chest 2 View  04/15/2012  *RADIOLOGY REPORT*  Clinical Data: Loss of consciousness  CHEST - 2 VIEW  Comparison: None  Findings: Heart size is normal.  No pleural effusion or edema.  No airspace consolidation.  Lungs are hyperinflated and there are coarsened interstitial changes suggestive of COPD.  Postoperative changes noted the left upper lobe.  IMPRESSION:  1.  No acute cardiopulmonary abnormalities.  Original Report  Authenticated By: Rosealee Albee, M.D.   Ct Head Wo Contrast  04/15/2012  *RADIOLOGY REPORT*  Clinical Data: Loss of consciousness.  CT HEAD WITHOUT CONTRAST  Technique:  Contiguous axial images were obtained from the base of the skull through the vertex without contrast.  Comparison: Head CT 12/24/2009.  Findings: No acute displaced skull fractures are identified.  No acute intracranial abnormality.  Specifically, no definite evidence of acute/subacute cerebral ischemia, no signs of acute post- traumatic intracranial hemorrhage, no focal mass, mass effect, hydrocephalus or abnormal intra or extra-axial fluid collections. Old lacunar infarctions are noted in the lateral aspect of the left thalamus, posterior limb of the right internal capsule, and anterior limb of the left internal capsule.  Cerebral and cerebellar atrophy is age appropriate.  There are extensive patchy and confluent areas of decreased attenuation throughout the deep and periventricular white matter of the cerebral hemispheres bilaterally, compatible with chronic microvascular ischemic changes.  Visualized paranasal sinuses and mastoids are well pneumatized.  IMPRESSION: 1.  No acute intracranial abnormalities. 2.  Cerebral and cerebellar atrophy with multiple old lacunar infarctions and chronic microvascular ischemic changes in the cerebral white matter, as above.  Original Report Authenticated By: Florencia Reasons, M.D.   Ct Angio Chest W/cm &/or Wo Cm  04/15/2012  *RADIOLOGY REPORT*  Clinical Data: Fall.  Loss of consciousness.  CT ANGIOGRAPHY CHEST  Technique:  Multidetector CT imaging of the chest using the standard protocol during bolus administration of intravenous contrast. Multiplanar reconstructed images including MIPs were obtained and reviewed to evaluate the vascular anatomy.  Contrast: 80mL OMNIPAQUE IOHEXOL 350 MG/ML SOLN  Comparison: None  Findings: No axillary or supraclavicular adenopathy.  There is no mediastinal or  hilar adenopathy. Saccular aneurysmal involving the aortic arch is identified measuring approximately 1.3 cm, image 25.  No mediastinal or hilar adenopathy present.  There is no pericardial or pleural effusion.  Calcifications involving the LAD coronary artery noted.  No abnormal filling defects within the main pulmonary artery or its branches are identified to suggest an acute pulmonary embolus.  There are dependent type changes identified in both lung bases.  No airspace consolidation or atelectasis.  No suspicious pulmonary parenchymal nodule or mass noted.  The bones are osteopenic.  There are no worrisome lytic or sclerotic bone lesions.  IMPRESSION:  1.  No evidence for acute pulmonary embolus or acute cardiopulmonary abnormality. 2.  Coronary artery calcifications. 3.  Focal aneurysm arises from the aortic arch  Original Report Authenticated By: Rosealee Albee, M.D.   Mr Brain Wo Contrast  04/16/2012  *RADIOLOGY REPORT*  Clinical Data: Chronic vertigo.  The patient fell yesterday. Syncope.  MRI HEAD WITHOUT CONTRAST  Technique:  Multiplanar, multiecho pulse sequences of the brain and surrounding structures were obtained according to standard protocol without intravenous contrast.  Comparison: CT head without contrast 04/15/2012.  MRI brain without contrast 12/24/2009.  Findings: No acute infarct, hemorrhage, or mass lesion is present. Periventricular  and subcortical white matter disease is not significantly changed from prior exam.  Moderate generalized atrophy is stable as well.  There are remote lacunar infarcts of the basal ganglia bilaterally.  Remote lacunar infarction also noted in the left mid brain.  Flow is present within the major  intracranial arteries.  The patient is status post bilateral lens extractions.  The globes and orbits are otherwise intact.  The paranasal sinuses are clear. Minimal fluid is present the mastoid air cells bilaterally.  No obstructing nasopharyngeal lesion is evident.   IMPRESSION:  1.  No acute intracranial abnormality or significant interval change. 2.  The stable atrophy and white matter disease. 3.  Remote lacunar infarcts of the basal ganglia bilaterally are stable. 4.  Minimal fluid in the mastoid air cells bilaterally.  No obstructing nasopharyngeal lesion is evident.  Original Report Authenticated By: Jamesetta Orleans. MATTERN, M.D.   Labs: Basic Metabolic Panel:  Lab 04/15/12 1914  NA 139  K 4.0  CL 102  CO2 22  GLUCOSE 91  BUN 18  CREATININE 0.80  CALCIUM 9.2  MG --  PHOS --   Liver Function Tests:  Lab 04/15/12 1430  AST 29  ALT 17  ALKPHOS 83  BILITOT 0.8  PROT 6.7  ALBUMIN 3.8   CBC:  Lab 04/15/12 1430  WBC 6.7  NEUTROABS 4.5  HGB 13.9  HCT 41.8  MCV 97.7  PLT 190   Cardiac Enzymes:  Lab 04/15/12 1430  CKTOTAL 71  CKMB 2.8  CKMBINDEX --  TROPONINI <0.30    Principal Problem:  *Fall Active Problems:  Syncope  Spinal headache  Vertigo  Dysphagia  Hypertension  History of stroke   Time coordinating discharge: 20 minutes.  Signed:  Brendia Sacks, MD [facility] 04/17/2012, 3:31 PM

## 2012-04-18 NOTE — Progress Notes (Signed)
On 04-17-12, prior to pt's discharge, Belinda Webb was evaluated for long-term disease management services with [facility] Care Management Program.. Patient will receive a post discharge transition of care call as a benefit of her [facility] insurance and monthly home visits if needed. Information about [facility] and services were explained and given to Belinda Webb and her son.   Raiford Noble, RN,BSN, [facility] Liaison, [phone]

## 2012-05-16 NOTE — Progress Notes (Addendum)
PT Late Entry on 05/16/12 1110 for May 11, 2012:  Reviewed medical record including [place] [facility] PT evaluation on May 09, 2012 to determine G-Codes   05-11-2012 1600  PT G-Codes **NOT FOR INPATIENT CLASS**  Functional Assessment Tool Used Documentation Review of Medical Record  Functional Limitation Mobility: Walking and moving around  Mobility: Walking and Moving Around Current Status [phone]) CI  Mobility: Walking and Moving Around Goal Status (U0454) CH  PT General Charges  $$ ACUTE PT VISIT 1 Procedure  PT Evaluation  $Initial PT Evaluation Tier II 1 Procedure

## 2012-05-16 NOTE — Progress Notes (Signed)
Speech Pathology 05/16/12 Late Entry: G-code entry based on chart review of Leah Mccoy's BSE on 05/13/2012   2012/05/13 1121  SLP G-Codes **NOT FOR INPATIENT CLASS**  Functional Assessment Tool Used clinical judgement per char review of BSE  Functional Limitations Swallowing  Swallow Current Status (W0981) CH  Swallow Goal Status (X9147) [facility]  Swallow Discharge Status (W2956) CH  SLP Evaluations  $ SLP Speech Visit 1 Procedure  SLP Evaluations  $BSS Swallow 1 Procedure    Myra Rude, M.S.,CCC-SLP Pager [phone]

## 2012-05-23 ENCOUNTER — Encounter: Payer: Self-pay | Admitting: Cardiology

## 2012-05-23 ENCOUNTER — Ambulatory Visit (INDEPENDENT_AMBULATORY_CARE_PROVIDER_SITE_OTHER): Payer: Medicare Other | Admitting: Cardiology

## 2012-05-23 VITALS — BP 170/90 | HR 69 | Ht 64.0 in | Wt 112.0 lb

## 2012-05-23 DIAGNOSIS — R55 Syncope and collapse: Secondary | ICD-10-CM

## 2012-05-23 NOTE — Patient Instructions (Addendum)
The current medical regimen is effective;  continue present plan and medications.  Follow up as needed 

## 2012-05-23 NOTE — Progress Notes (Signed)
HPI The patient was referred for evaluation of a possible syncopal episode.  She has no prior cardiac history. I did review an old Holter monitor from 2008 which demonstrated infrequent ectopy.  She says she had a stress test years ago. She's otherwise had no cardiac symptoms or workup and doesn't recall why she has minus. On June 30 does not report. While there she became dizzy. Things went black for a very brief episode and then she lost her balance. She said she did not lose consciousness. She did injure herself slightly. She was admitted to the hospital and I did review these records. There were no acute abnormalities on head CT or MRI. Carotid Dopplers demonstrated no high-grade disease. It was suggested that she might need an echocardiogram but this could not be done at discharge. She was referred for further evaluation to see if this test is really necessary.  Since that hospitalization he has had no recurrent symptoms similar to this. She has vertigo and thinks this was likely caused by that. She's had no further dizziness. She's had no presyncope or syncope. She denies any palpitations. There has been no chest pressure, neck or arm discomfort. She has no shortness of breath, PND or orthopnea. She does have some back pain but despite this she participates in an exercise class.  Allergies  Allergen Reactions  . Lipitor (Atorvastatin Calcium) Other (See Comments)    Reaction unknown  . Nitrofurantoin Monohyd Macro Other (See Comments)    Reaction unknown  . Penicillins Other (See Comments)    Reaction unknown  . Plavix (Clopidogrel Bisulfate) Other (See Comments)    Reaction unknown  . Statins     REACTION: effects muscles  . Sulfa Antibiotics Other (See Comments)    Reaction unknown    Current Outpatient Prescriptions  Medication Sig Dispense Refill  . amLODipine (NORVASC) 5 MG tablet Take 5 mg by mouth daily.       . MECLIZINE HCL PO Take 1 tablet by mouth daily as needed. For  vertigo      . omeprazole (PRILOSEC) 20 MG capsule Take 20 mg by mouth daily.        Past Medical History  Diagnosis Date  . Lung tumor 1984    LUL  . HTN (hypertension)   . Stroke   . Chronic cough   . HLD (hyperlipidemia)   . Vertigo     Past Surgical History  Procedure Date  . Back surgery   . Total abdominal hysterectomy 1947  . Lung surgery 1984    LUL   . Appendectomy 1938  . Breast biopsy     left  . Tonsillectomy and adenoidectomy   . Cataract extraction, bilateral     Family History  Problem Relation Age of Onset  . Diabetes Father   . Hearing loss    . Hypertension Father   . Stroke Mother   . Tuberculosis Sister   . Emphysema Brother   . Lung cancer Brother   . Colon cancer Neg Hx   . Colon polyps Son     History   Social History  . Marital Status: Widowed    Spouse Name: N/A    Number of Children: 2  . Years of Education: N/A   Occupational History  . retired    Social History Main Topics  . Smoking status: Former Smoker -- 0.5 packs/day for 40 years    Types: Cigarettes    Quit date: 10/17/1982  . Smokeless tobacco:  Never Used  . Alcohol Use: No  . Drug Use: No  . Sexually Active: Not on file   Other Topics Concern  . Not on file   Social History Narrative  . No narrative on file    ROS:  Positive for dizziness, palpitations, reflux, constipation leg pain and varicose veins. Otherwise as stated in the history of present illness and negative for all other systems.  PHYSICAL EXAM BP 170/90  Pulse 69  Ht 5\' 4"  (1.626 m)  Wt 112 lb (50.803 kg)  BMI 19.22 kg/m2 GENERAL:  Well appearing HEENT:  Pupils equal round and reactive, fundi not visualized, oral mucosa unremarkable NECK:  No jugular venous distention, waveform within normal limits, carotid upstroke brisk and symmetric, no bruits, no thyromegaly LYMPHATICS:  No cervical, inguinal adenopathy LUNGS:  Clear to auscultation bilaterally BACK:  No CVA tenderness CHEST:   Unremarkable HEART:  PMI not displaced or sustained,S1 and S2 within normal limits, no S3, positive S4, no clicks, no rubs, no murmurs ABD:  Flat, positive bowel sounds normal in frequency in pitch, no bruits, no rebound, no guarding, no midline pulsatile mass, no hepatomegaly, no splenomegaly EXT:  2 plus pulses throughout, no edema, no cyanosis no clubbing SKIN:  No rashes no nodules NEURO:  Cranial nerves II through XII grossly intact, motor grossly intact throughout PSYCH:  Cognitively intact, oriented to person place and time  EKG:  Sinus rhythm, rate 61, first degree AV block, poor anterior R wave progression, no acute ST-T wave changes. 04/15/12  ASSESSMENT AND PLAN  Syncope - Given the fact she's had no recurrent episodes and at this wasn't a classic syncopal episode I don't think further testing is indicated.  She will let me know if she has recurrent symptoms in the future at which point further evaluation possibly with an event monitor and echo would be warranted.  HTN -  Her blood pressure is elevated today. He has not consistently been elevated on outpatient readings. She should keep a blood pressure diary. Further adjustments will be based on these readings.

## 2012-06-07 ENCOUNTER — Other Ambulatory Visit: Payer: Self-pay | Admitting: Family Medicine

## 2012-06-07 DIAGNOSIS — M545 Low back pain, unspecified: Secondary | ICD-10-CM

## 2012-06-11 ENCOUNTER — Ambulatory Visit (HOSPITAL_COMMUNITY)
Admission: RE | Admit: 2012-06-11 | Discharge: 2012-06-11 | Disposition: A | Discharge status: Home / Self Care | Payer: Medicare Other | Source: Ambulatory Visit | Attending: Family Medicine | Admitting: Family Medicine

## 2012-06-11 DIAGNOSIS — M79609 Pain in unspecified limb: Secondary | ICD-10-CM | POA: Insufficient documentation

## 2012-06-11 DIAGNOSIS — M545 Low back pain, unspecified: Secondary | ICD-10-CM | POA: Insufficient documentation

## 2012-06-11 DIAGNOSIS — M48061 Spinal stenosis, lumbar region without neurogenic claudication: Secondary | ICD-10-CM | POA: Insufficient documentation

## 2013-03-13 ENCOUNTER — Other Ambulatory Visit: Payer: Self-pay

## 2013-03-13 DIAGNOSIS — Z1231 Encounter for screening mammogram for malignant neoplasm of breast: Secondary | ICD-10-CM

## 2013-03-20 ENCOUNTER — Telehealth: Payer: Self-pay | Admitting: *Deleted

## 2013-03-20 NOTE — Telephone Encounter (Signed)
Pt complains of back pain for years s/p spinal fracture and subsequent vertebroplasty.  The pain is constant and has been tolerating it but doesn't feel that she can ignore it any longer.  She has pain medication but it makes her sleepy.  Patient came alone and she drove herself.  Appt scheduled for tomorrow morning.  She will call back or call 911 if pain worsens.  I instructed her not to drive if she was in severe pain.  She is going to take her pain medication when she gets home.

## 2013-03-21 ENCOUNTER — Ambulatory Visit (INDEPENDENT_AMBULATORY_CARE_PROVIDER_SITE_OTHER): Payer: Medicare Other | Admitting: Physician Assistant

## 2013-03-21 ENCOUNTER — Encounter: Payer: Self-pay | Admitting: Physician Assistant

## 2013-03-21 VITALS — BP 131/68 | HR 59 | Temp 97.1°F | Ht 63.0 in | Wt 114.0 lb

## 2013-03-21 DIAGNOSIS — M549 Dorsalgia, unspecified: Secondary | ICD-10-CM

## 2013-03-21 NOTE — Progress Notes (Signed)
Subjective:     Patient ID: Belinda Webb, female   DOB: 11/21/1924, 79 y.o.   MRN: [id number]  HPI Pt with a long hx of LBP and hip pain States she has been to mult providers/spec and has had mult studies She states she has never been told what is wrong with her back It sounds like she has had prev epidural steroid injection She also relates that Dr Leone Payor gave her what sounds like and SI injection that helped for 6-8 weeks Pt with recent flare of her L LBP  Review of Systems  All other systems reviewed and are negative.       Objective:   Physical Exam  Nursing note and vitals reviewed.  NAD Using cane for ambulation No ecchy/edema to L-spine FROM L-spine + TTP L L-spine and SI joint Good muscle strength that is equal Pulses/sensory good    Assessment:     1. Backache        Plan:     Reviewed all prev results with pt Pt to f/u with Dr Leone Payor Cont current meds  F/U prn

## 2013-03-21 NOTE — Patient Instructions (Signed)
Spinal Stenosis One cause of back pain is spinal stenosis. Stenosis means abnormal narrowing. The spinal canal contains and protects the spinal nerve roots. In spinal stenosis, the spinal canal narrows and pinches the spinal cord and nerves. This causes low back pain and pain in the legs. Stenosis may pinch the nerves that control muscles and sensation in the legs. This leads to pain and abnormal feelings in the leg muscles and areas supplied by those nerves. CAUSES  Spinal stenosis often happens to people as they get older and arthritic boney growths occur in their spinal canal. There is also a loss of the disk height between the bones of the back, which also adds to this problem. Sometimes the problem is present at birth. SYMPTOMS   Pain that is generally worse with activities, particularly standing and walking.  Numbness, tingling, hot or cold feelings, weakness, or a weariness in the legs.  Clumsiness, frequent falling, and a foot-slapping gait, which may come as a result of nerve pressure and muscle weakness. DIAGNOSIS   Your caregiver may suspect spinal stenosis if you have unusual leg symptoms, such as those previously mentioned.  Your orthopedic surgeon may request special imaging exams, such a computerized magnetic scan (MRI) or computerized X-ray scan (CT) to find out the cause of the problem. TREATMENT   Sometimes treatments such as postural changes or nonsteroidal anti-inflammatory drugs will relieve the pain.  Nonsteroidal anti-inflammatory medications may help relieve symptoms. These medicines do this by decreasing swelling and inflammation in the nerves.  When stenosis causes severe nerve root compression, conservative treatment may not be enough to maintain a normal lifestyle. Surgery may be recommended to relieve the pressure on affected nerves. In properly selected patients, the results are very good, and patients are able to continue a normal lifestyle. HOME CARE  INSTRUCTIONS   Flexing the spine by leaning forward while walking may relieve symptoms. Lying with the knees drawn up to the chest may offer some relief. These positions enlarge the space available to the nerves. They may make it easier for stenosis sufferers to walk longer distances.  Rest, followed by gradually resuming activity, also can help.  Aerobic activity, such as bicycling or swimming, is often recommended.  Losing weight can also relieve some of the load on the spine.  Application of warm or cold compresses to the area of pain can be helpful. SEEK MEDICAL CARE IF:   The periods of relief between episodes of pain become shorter and shorter.  You experience pain that radiates down your leg, even when you are not standing or walking. SEEK IMMEDIATE MEDICAL CARE IF:   You have a loss of bowel or bladder control.  You have a sudden loss of feeling in your legs.  You suddenly cannot move your legs. Document Released: 12/24/2003 Document Revised: 12/26/2011 Document Reviewed: 02/18/2010 [employer] Patient Information 2014 [employer].  

## 2013-04-18 ENCOUNTER — Ambulatory Visit: Payer: Medicare Other

## 2013-04-22 ENCOUNTER — Ambulatory Visit
Admission: RE | Admit: 2013-04-22 | Discharge: 2013-04-22 | Disposition: A | Discharge status: Home / Self Care | Payer: Medicare Other | Source: Ambulatory Visit

## 2013-04-22 DIAGNOSIS — Z1231 Encounter for screening mammogram for malignant neoplasm of breast: Secondary | ICD-10-CM

## 2013-04-23 NOTE — Progress Notes (Signed)
Quick Note:  As per report. ______

## 2013-07-10 ENCOUNTER — Ambulatory Visit (INDEPENDENT_AMBULATORY_CARE_PROVIDER_SITE_OTHER): Payer: Medicare Other | Admitting: General Practice

## 2013-07-10 ENCOUNTER — Encounter: Payer: Self-pay | Admitting: General Practice

## 2013-07-10 ENCOUNTER — Ambulatory Visit: Payer: BLUE CROSS/BLUE SHIELD | Admitting: General Practice

## 2013-07-10 VITALS — BP 152/78 | HR 86 | Temp 98.2°F | Ht 63.0 in | Wt 107.0 lb

## 2013-07-10 DIAGNOSIS — L0390 Cellulitis, unspecified: Secondary | ICD-10-CM

## 2013-07-10 DIAGNOSIS — R233 Spontaneous ecchymoses: Secondary | ICD-10-CM

## 2013-07-10 DIAGNOSIS — L0291 Cutaneous abscess, unspecified: Secondary | ICD-10-CM

## 2013-07-10 LAB — POCT CBC
Granulocyte percent: 62.6 %G (ref 37–80)
Lymph, poc: 2.1 (ref 0.6–3.4)
MCH, POC: 31.6 pg — AB (ref 27–31.2)
MPV: 6.9 fL (ref 0–99.8)
POC LYMPH PERCENT: 32.2 %L (ref 10–50)
Platelet Count, POC: 262 10*3/uL (ref 142–424)
RBC: 4.4 M/uL (ref 4.04–5.48)

## 2013-07-10 MED ORDER — AZITHROMYCIN 250 MG PO TABS: ORAL_TABLET | ORAL | Status: DC

## 2013-07-10 NOTE — Progress Notes (Signed)
  Subjective:    Patient ID: Belinda Webb, female    DOB: 12-08-1924, 79 y.o.   MRN: [id number]  HPI Patient presents today with complaints of bruising to bilateral upper arms, which she reports intermittently appear. She denies taking blood thinners. Also reports hitting her left shin area on a car last Friday morning and now it appears red.     Review of Systems  Constitutional: Negative for fever and chills.  Respiratory: Negative for chest tightness and shortness of breath.   Cardiovascular: Negative for chest pain and palpitations.  Gastrointestinal: Negative for blood in stool.  Genitourinary: Negative for hematuria.  Skin:       Red spots on skin, more on upper extremities  Neurological: Negative for dizziness, weakness and headaches.  Hematological: Bruises/bleeds easily.       Objective:   Physical Exam  Constitutional: She is oriented to person, place, and time. She appears well-developed and well-nourished.  HENT:  Head: Normocephalic and atraumatic.  Eyes: EOM are normal.  Cardiovascular: Normal rate, regular rhythm and normal heart sounds.   Pulmonary/Chest: Effort normal and breath sounds normal.  Abdominal: Soft. Bowel sounds are normal. She exhibits no distension. There is no tenderness.  Neurological: She is alert and oriented to person, place, and time.  Skin: Skin is warm and dry.  Petechiae noted to bilateral upper extremities. 1/4 x 1/4 inch erythematous, warm to touch area noted to left shin area. Negative for current drainage.   Psychiatric: She has a normal mood and affect.   Results for orders placed in visit on 07/10/13  POCT CBC      Result Value Range   WBC 6.4  4.6 - 10.2 K/uL   Lymph, poc 2.1  0.6 - 3.4   POC LYMPH PERCENT 32.2  10 - 50 %L   POC Granulocyte 4.0  2 - 6.9   Granulocyte percent 62.6  37 - 80 %G   RBC 4.4  4.04 - 5.48 M/uL   Hemoglobin 13.9  12.2 - 16.2 g/dL   HCT, POC 40.9  81.1 - 47.9 %   MCV 96.8  80 - 97 fL   MCH, POC 31.6  (*) 27 - 31.2 pg   MCHC 32.7  31.8 - 35.4 g/dL   RDW, POC 91.4     Platelet Count, POC 262.0  142 - 424 K/uL   MPV 6.9  0 - 99.8 fL  POCT INR      Result Value Range   INR 1.1            Assessment & Plan:  1. Petechiae - INR - Protime-INR - CBC with Differential -try to refrain from hitting extremities on objects  2. Cellulitis - azithromycin (ZITHROMAX) 250 MG tablet; Take as directed  Dispense: 6 tablet; Refill: 0 -keep area clean and dry -RTO if symptoms worsen or unresolved -Patient verbalized understanding Coralie Keens, FNP-C

## 2013-08-26 ENCOUNTER — Other Ambulatory Visit: Payer: Self-pay

## 2013-08-26 MED ORDER — AMLODIPINE BESYLATE 5 MG PO TABS: 5.0000 mg | ORAL_TABLET | Freq: Every day | ORAL | Status: DC

## 2013-12-09 ENCOUNTER — Ambulatory Visit (INDEPENDENT_AMBULATORY_CARE_PROVIDER_SITE_OTHER): Payer: Medicare Other | Admitting: Nurse Practitioner

## 2013-12-09 VITALS — BP 149/74 | HR 61 | Temp 97.3°F | Wt 110.0 lb

## 2013-12-09 DIAGNOSIS — R197 Diarrhea, unspecified: Secondary | ICD-10-CM

## 2013-12-09 LAB — POCT CBC
GRANULOCYTE PERCENT: 61.7 % (ref 37–80)
HEMATOCRIT: 42.2 % (ref 37.7–47.9)
HEMOGLOBIN: 13.8 g/dL (ref 12.2–16.2)
Lymph, poc: 1.7 (ref 0.6–3.4)
MCH, POC: 31.4 pg — AB (ref 27–31.2)
MCHC: 32.8 g/dL (ref 31.8–35.4)
MCV: 95.8 fL (ref 80–97)
MPV: 7.2 fL (ref 0–99.8)
POC GRANULOCYTE: 3.3 (ref 2–6.9)
POC LYMPH %: 32.2 % (ref 10–50)
Platelet Count, POC: 228 10*3/uL (ref 142–424)
RBC: 4.4 M/uL (ref 4.04–5.48)
RDW, POC: 13.8 %
WBC: 5.3 10*3/uL (ref 4.6–10.2)

## 2013-12-09 MED ORDER — DIPHENOXYLATE-ATROPINE 2.5-0.025 MG PO TABS: 1.0000 | ORAL_TABLET | Freq: Four times a day (QID) | ORAL | Status: DC | PRN

## 2013-12-09 MED ORDER — MECLIZINE HCL 25 MG PO TABS: 25.0000 mg | ORAL_TABLET | Freq: Three times a day (TID) | ORAL | Status: DC | PRN

## 2013-12-09 NOTE — Progress Notes (Signed)
   Subjective:    Patient ID: FREDDI FORSTER, female    DOB: May 20, 1925, 80 y.o.   MRN: [id number]  HPI Patient in c/o diarrhea for the last 4 days- hits her more at night then during the day- slight abdominal pain but usually when needs to go to th ebathroom.    Review of Systems  Constitutional: Negative.   HENT: Negative.   Respiratory: Negative.   Cardiovascular: Negative.   Gastrointestinal: Positive for diarrhea.  Genitourinary: Negative.   Musculoskeletal: Negative.   All other systems reviewed and are negative.       Objective:   Physical Exam  Constitutional: She is oriented to person, place, and time. She appears well-developed and well-nourished.  Cardiovascular: Normal rate, regular rhythm and normal heart sounds.   Pulmonary/Chest: Effort normal and breath sounds normal.  Abdominal: Soft. There is tenderness (very mild diffuse).  Neurological: She is alert and oriented to person, place, and time.  Skin: Skin is warm and dry.  Psychiatric: She has a normal mood and affect. Her behavior is normal. Judgment and thought content normal.   BP 149/74  Pulse 61  Temp(Src) 97.3 F (36.3 C) (Oral)  Wt 110 lb (49.896 kg)        Assessment & Plan:   1. Diarrhea    First 24 Hours-Clear liquids  popsicles  Jello  gatorade  Sprite Second 24 hours-Add Full liquids ( Liquids you cant see through) Third 24 hours- Bland diet ( foods that are baked or broiled)  *avoiding fried foods and highly spiced foods* During these 3 days  Avoid milk, cheese, ice cream or any other dairy products  Avoid caffeine- REMEMBER Mt. Dew and Mello Yellow contain lots of caffeine You should eat and drink in  Frequent small volumes If no improvement in symptoms or worsen in 2-3 days should RETRUN TO OFFICE or go to ER!    Mary-Margaret Hassell Done, FNP

## 2013-12-09 NOTE — Patient Instructions (Signed)
Diarrhea Diarrhea is frequent loose and watery bowel movements. It can cause you to feel weak and dehydrated. Dehydration can cause you to become tired and thirsty, have a dry mouth, and have decreased urination that often is dark yellow. Diarrhea is a sign of another problem, most often an infection that will not last long. In most cases, diarrhea typically lasts 2 3 days. However, it can last longer if it is a sign of something more serious. It is important to treat your diarrhea as directed by your caregive to lessen or prevent future episodes of diarrhea. CAUSES  Some common causes include:  Gastrointestinal infections caused by viruses, bacteria, or parasites.  Food poisoning or food allergies.  Certain medicines, such as antibiotics, chemotherapy, and laxatives.  Artificial sweeteners and fructose.  Digestive disorders. HOME CARE INSTRUCTIONS  Ensure adequate fluid intake (hydration): have 1 cup (8 oz) of fluid for each diarrhea episode. Avoid fluids that contain simple sugars or sports drinks, fruit juices, whole milk products, and sodas. Your urine should be clear or pale yellow if you are drinking enough fluids. Hydrate with an oral rehydration solution that you can purchase at pharmacies, retail stores, and online. You can prepare an oral rehydration solution at home by mixing the following ingredients together:    tsp table salt.   tsp baking soda.   tsp salt substitute containing potassium chloride.  1  tablespoons sugar.  1 L (34 oz) of water.  Certain foods and beverages may increase the speed at which food moves through the gastrointestinal (GI) tract. These foods and beverages should be avoided and include:  Caffeinated and alcoholic beverages.  High-fiber foods, such as raw fruits and vegetables, nuts, seeds, and whole grain breads and cereals.  Foods and beverages sweetened with sugar alcohols, such as xylitol, sorbitol, and mannitol.  Some foods may be well  tolerated and may help thicken stool including:  Starchy foods, such as rice, toast, pasta, low-sugar cereal, oatmeal, grits, baked potatoes, crackers, and bagels.  Bananas.  Applesauce.  Add probiotic-rich foods to help increase healthy bacteria in the GI tract, such as yogurt and fermented milk products.  Wash your hands well after each diarrhea episode.  Only take over-the-counter or prescription medicines as directed by your caregiver.  Take a warm bath to relieve any burning or pain from frequent diarrhea episodes. SEEK IMMEDIATE MEDICAL CARE IF:   You are unable to keep fluids down.  You have persistent vomiting.  You have blood in your stool, or your stools are black and tarry.  You do not urinate in 6 8 hours, or there is only a small amount of very dark urine.  You have abdominal pain that increases or localizes.  You have weakness, dizziness, confusion, or lightheadedness.  You have a severe headache.  Your diarrhea gets worse or does not get better.  You have a fever or persistent symptoms for more than 2 3 days.  You have a fever and your symptoms suddenly get worse. MAKE SURE YOU:   Understand these instructions.  Will watch your condition.  Will get help right away if you are not doing well or get worse. Document Released: 09/23/2002 Document Revised: 09/19/2012 Document Reviewed: 06/10/2012 [employer] Patient Information 2014 [employer].  

## 2013-12-10 LAB — BMP8+EGFR
BUN/Creatinine Ratio: 20 (ref 11–26)
BUN: 16 mg/dL (ref 8–27)
CALCIUM: 9.1 mg/dL (ref 8.7–10.3)
CHLORIDE: 101 mmol/L (ref 97–108)
CO2: 22 mmol/L (ref 18–29)
Creatinine, Ser: 0.81 mg/dL (ref 0.57–1.00)
GFR calc Af Amer: 76 mL/min/{1.73_m2} (ref >59)
GFR, EST NON AFRICAN AMERICAN: 66 mL/min/{1.73_m2} (ref >59)
GLUCOSE: 100 mg/dL — AB (ref 65–99)
POTASSIUM: 4.2 mmol/L (ref 3.5–5.2)
Sodium: 138 mmol/L (ref 134–144)

## 2014-01-09 ENCOUNTER — Other Ambulatory Visit: Payer: Self-pay | Admitting: *Deleted

## 2014-01-09 MED ORDER — AMLODIPINE BESYLATE 5 MG PO TABS: 5.0000 mg | ORAL_TABLET | Freq: Every day | ORAL | Status: DC

## 2014-03-03 ENCOUNTER — Ambulatory Visit (INDEPENDENT_AMBULATORY_CARE_PROVIDER_SITE_OTHER): Payer: Medicare Other | Admitting: Nurse Practitioner

## 2014-03-03 VITALS — BP 134/70 | HR 74 | Temp 97.4°F | Ht 63.0 in | Wt 108.2 lb

## 2014-03-03 DIAGNOSIS — L989 Disorder of the skin and subcutaneous tissue, unspecified: Secondary | ICD-10-CM

## 2014-03-03 NOTE — Progress Notes (Signed)
   Subjective:    Patient ID: Belinda Webb, female    DOB: 03/27/1925, 80 y.o.   MRN: [id number]  HPI Patient in c/o sore on chest wall that will not heal- Some areas have been there for over a year.    Review of Systems  Constitutional: Negative.   HENT: Negative.   Respiratory: Negative.   Cardiovascular: Negative.   Genitourinary: Negative.   Neurological: Negative.   Psychiatric/Behavioral: Negative.   All other systems reviewed and are negative.      Objective:   Physical Exam  Constitutional: She is oriented to person, place, and time. She appears well-developed and well-nourished.  Cardiovascular: Normal rate, regular rhythm and normal heart sounds.   Pulmonary/Chest: Effort normal and breath sounds normal.  Neurological: She is alert and oriented to person, place, and time.  Skin: Skin is warm.  3cm annular erythematous lesion with central indentation on mid upper anterior chest wall.  Psychiatric: She has a normal mood and affect. Her behavior is normal. Judgment and thought content normal.   BP 134/70  Pulse 74  Temp(Src) 97.4 F (36.3 C) (Oral)  Ht 5\' 3"  (1.6 m)  Wt 108 lb 3.2 oz (49.079 kg)  BMI 19.17 kg/m2        Assessment & Plan:   1. Skin lesion of chest wall    Orders Placed This Encounter  Procedures  . Ambulatory referral to Dermatology    Referral Priority:  Urgent    Referral Type:  Consultation    Referral Reason:  Specialty Services Required    Requested Specialty:  Dermatology    Number of Visits Requested:  1   Do not pick at lesion Keep covered  RTO prn  Mary-Margaret Hassell Done, FNP

## 2014-03-06 ENCOUNTER — Other Ambulatory Visit: Payer: Self-pay | Admitting: Dermatology

## 2014-03-20 ENCOUNTER — Encounter: Payer: Self-pay | Admitting: Nurse Practitioner

## 2014-03-20 ENCOUNTER — Other Ambulatory Visit: Payer: Self-pay | Admitting: Nurse Practitioner

## 2014-03-20 ENCOUNTER — Ambulatory Visit (INDEPENDENT_AMBULATORY_CARE_PROVIDER_SITE_OTHER): Payer: Medicare Other | Admitting: Nurse Practitioner

## 2014-03-20 VITALS — BP 168/84 | HR 68 | Temp 97.0°F | Ht 65.5 in | Wt 106.6 lb

## 2014-03-20 DIAGNOSIS — L989 Disorder of the skin and subcutaneous tissue, unspecified: Secondary | ICD-10-CM

## 2014-03-20 MED ORDER — AMLODIPINE BESYLATE 5 MG PO TABS: 5.0000 mg | ORAL_TABLET | Freq: Every day | ORAL | Status: DC

## 2014-03-20 NOTE — Progress Notes (Signed)
   Subjective:    Patient ID: Belinda Webb, female    DOB: [birth year], 80 y.o.   MRN: [id number]  HPI Patient is here today for a follow up visit. She saw Dr. Allyson Sabal on the 21st of may for a skin lesion removal from anterior chest wall- Poorly differentiated cancerous lesion- He snet her back here to have her entire body looked at.    Review of Systems  All other systems reviewed and are negative.      Objective:   Physical Exam  Constitutional: She appears well-developed and well-nourished.  Cardiovascular: Normal rate, regular rhythm and normal heart sounds.   Pulmonary/Chest: Effort normal.  Skin:  Wound edges to anterior chest are well approximated- no sign of infection Multiple greyish dry skin lesions on trunk Erthematous dy 5cm lesion on right shin- no drainage  Psychiatric: She has a normal mood and affect. Her behavior is normal. Judgment and thought content normal.  BP 168/84  Pulse 68  Temp(Src) 97 F (36.1 C) (Oral)  Ht 5' 5.5" (1.664 m)  Wt 106 lb 9.6 oz (48.353 kg)  BMI 17.46 kg/m2         Assessment & Plan:   1. Skin lesions    Patient already has follow up appointment with Dr. Allyson Sabal- patient will show him her leg lesion while there. RTO prn  Mary-Margaret Hassell Done, FNP

## 2014-03-21 ENCOUNTER — Other Ambulatory Visit: Payer: Self-pay | Admitting: Nurse Practitioner

## 2014-09-16 ENCOUNTER — Other Ambulatory Visit: Payer: Self-pay | Admitting: Nurse Practitioner

## 2014-09-16 ENCOUNTER — Telehealth: Payer: Self-pay | Admitting: *Deleted

## 2014-09-16 NOTE — Telephone Encounter (Signed)
Last ov 6/15. ntbs

## 2014-09-16 NOTE — Telephone Encounter (Signed)
Aware, will need to be seen for more BP medication refills.

## 2014-09-16 NOTE — Telephone Encounter (Signed)
Patient NTBS for follow up and lab work  

## 2014-09-18 ENCOUNTER — Other Ambulatory Visit: Payer: Self-pay | Admitting: Nurse Practitioner

## 2014-11-15 ENCOUNTER — Other Ambulatory Visit: Payer: Self-pay | Admitting: Nurse Practitioner

## 2014-11-25 ENCOUNTER — Ambulatory Visit (INDEPENDENT_AMBULATORY_CARE_PROVIDER_SITE_OTHER): Payer: Medicare Other | Admitting: Family Medicine

## 2014-11-25 VITALS — BP 146/69 | HR 62 | Temp 96.3°F | Ht 65.0 in | Wt 112.0 lb

## 2014-11-25 DIAGNOSIS — H109 Unspecified conjunctivitis: Secondary | ICD-10-CM

## 2014-11-25 DIAGNOSIS — K5909 Other constipation: Secondary | ICD-10-CM

## 2014-11-25 MED ORDER — LINACLOTIDE 145 MCG PO CAPS: 145.0000 ug | ORAL_CAPSULE | Freq: Every day | ORAL | Status: DC

## 2014-11-25 MED ORDER — POLYMYXIN B-TRIMETHOPRIM 10000-0.1 UNIT/ML-% OP SOLN: 1.0000 [drp] | OPHTHALMIC | Status: DC

## 2014-11-25 NOTE — Progress Notes (Signed)
   Subjective:    Patient ID: Belinda Webb, female    DOB: [birth year], 81 y.o.   MRN: [id number]  HPI Patient is c/o right eye redness and itching.  She is c/o constipation.  Review of Systems  Constitutional: Negative for fever.  HENT: Negative for ear pain.   Eyes: Negative for discharge.  Respiratory: Negative for cough.   Cardiovascular: Negative for chest pain.  Gastrointestinal: Negative for abdominal distention.  Endocrine: Negative for polyuria.  Genitourinary: Negative for difficulty urinating.  Musculoskeletal: Negative for gait problem and neck pain.  Skin: Negative for color change and rash.  Neurological: Negative for speech difficulty and headaches.  Psychiatric/Behavioral: Negative for agitation.       Objective:    BP 146/69 mmHg  Pulse 62  Temp(Src) 96.3 F (35.7 C) (Oral)  Ht 5\' 5"  (1.651 m)  Wt 112 lb (50.803 kg)  BMI 18.64 kg/m2 Physical Exam  Constitutional: She is oriented to person, place, and time. She appears well-developed and well-nourished.  HENT:  Head: Normocephalic and atraumatic.  Mouth/Throat: Oropharynx is clear and moist.  Eyes: Pupils are equal, round, and reactive to light.  Neck: Normal range of motion. Neck supple.  Cardiovascular: Normal rate and regular rhythm.   No murmur heard. Pulmonary/Chest: Effort normal and breath sounds normal.  Abdominal: Soft. Bowel sounds are normal. There is no tenderness.  Neurological: She is alert and oriented to person, place, and time.  Skin: Skin is warm and dry.  Psychiatric: She has a normal mood and affect.          Assessment & Plan:     ICD-9-CM ICD-10-CM   1. Conjunctivitis of right eye 372.30 H10.9 trimethoprim-polymyxin b (POLYTRIM) ophthalmic solution  2. Other constipation 564.09 K59.09 Linaclotide (LINZESS) 145 MCG CAPS capsule     No Follow-up on file.  Lysbeth Penner FNP

## 2014-12-02 ENCOUNTER — Other Ambulatory Visit: Payer: Self-pay | Admitting: Nurse Practitioner

## 2014-12-18 ENCOUNTER — Other Ambulatory Visit: Payer: Self-pay | Admitting: Nurse Practitioner

## 2014-12-19 NOTE — Telephone Encounter (Signed)
Labs way past due

## 2014-12-21 NOTE — Telephone Encounter (Signed)
no more refills without being seen  

## 2015-01-17 ENCOUNTER — Other Ambulatory Visit: Payer: Self-pay | Admitting: Nurse Practitioner

## 2015-01-21 ENCOUNTER — Encounter: Payer: Self-pay | Admitting: Nurse Practitioner

## 2015-01-21 ENCOUNTER — Ambulatory Visit (INDEPENDENT_AMBULATORY_CARE_PROVIDER_SITE_OTHER): Payer: Medicare Other | Admitting: Nurse Practitioner

## 2015-01-21 VITALS — BP 156/82 | HR 67 | Temp 96.8°F | Ht 65.0 in | Wt 111.0 lb

## 2015-01-21 DIAGNOSIS — I10 Essential (primary) hypertension: Secondary | ICD-10-CM | POA: Diagnosis not present

## 2015-01-21 DIAGNOSIS — K224 Dyskinesia of esophagus: Secondary | ICD-10-CM

## 2015-01-21 DIAGNOSIS — I639 Cerebral infarction, unspecified: Secondary | ICD-10-CM

## 2015-01-21 DIAGNOSIS — C761 Malignant neoplasm of thorax: Secondary | ICD-10-CM

## 2015-01-21 MED ORDER — AMLODIPINE BESYLATE 5 MG PO TABS: ORAL_TABLET | ORAL | Status: DC

## 2015-01-21 MED ORDER — OMEPRAZOLE 40 MG PO CPDR: 40.0000 mg | DELAYED_RELEASE_CAPSULE | Freq: Every day | ORAL | Status: DC

## 2015-01-21 NOTE — Patient Instructions (Signed)

## 2015-01-21 NOTE — Progress Notes (Signed)
   Subjective:    Patient ID: Belinda Webb, female    DOB: 12/13/1924, 81 y.o.   MRN: [id number]  The patient is here to follow up with her chronic hypertension and for medication refills. The patient states she takes her BP with an automated cuff at home 3-4 times per week prior to taking her medication and consistently measures 120s/70s. She confirms taking her BP medication this morning.  C/O cough constant except when she lays down at night. Denies heart burn  Hypertension This is a chronic problem. The current episode started more than 1 year ago. The problem has been gradually improving since onset. The problem is controlled. Associated symptoms include peripheral edema (Only if sitting for an extended period. ). Pertinent negatives include no chest pain, headaches, orthopnea, palpitations or shortness of breath. Risk factors for coronary artery disease include family history and post-menopausal state. Past treatments include calcium channel blockers.   * Hx of chest wall cancer- saw dermatologist fr follow up last week.   Review of Systems  Constitutional: Negative.   HENT: Positive for sneezing.   Eyes: Negative for visual disturbance.  Respiratory: Negative.  Negative for cough, chest tightness and shortness of breath.   Cardiovascular: Negative for chest pain, palpitations and orthopnea.  Neurological: Negative for syncope, weakness and headaches.       Objective:   Physical Exam  Constitutional: She is oriented to person, place, and time. She appears well-developed and well-nourished.  HENT:  Nose: Nose normal.  Mouth/Throat: Oropharynx is clear and moist.  Eyes: EOM are normal.  Neck: Trachea normal, normal range of motion and full passive range of motion without pain. Neck supple. No JVD present. Carotid bruit is not present. No thyromegaly present.  Cardiovascular: Normal rate, normal heart sounds and intact distal pulses.  Exam reveals no gallop and no friction rub.     No murmur heard. Heart rate irregular  Pulmonary/Chest: Effort normal and breath sounds normal.  Abdominal: Soft. Bowel sounds are normal. She exhibits no distension and no mass. There is no tenderness.  Musculoskeletal: Normal range of motion.  Lymphadenopathy:    She has no cervical adenopathy.  Neurological: She is alert and oriented to person, place, and time. She has normal reflexes.  Skin: Skin is warm and dry.  Psychiatric: She has a normal mood and affect. Her behavior is normal. Judgment and thought content normal.    BP 156/82 mmHg  Pulse 67  Temp(Src) 96.8 F (36 C) (Oral)  Ht 5' 5" (1.651 m)  Wt 111 lb (50.349 kg)  BMI 18.47 kg/m2  ekg- sinus bradycardia-Mary-Margaret Martin, FNP\     Assessment & Plan:  1. Cancer of chest (wall) Keep follow up appointments with dermatology  2. Essential hypertension Do not add salt to diet - amLODipine (NORVASC) 5 MG tablet; TAKE 1 TABLET (5 MG TOTAL) BY MOUTH DAILY.  Dispense: 30 tablet; Refill: 5 - EKG 12-Lead - CMP14+EGFR - NMR, lipoprofile  3. Stroke  4. Esophageal dysmotility Avoid spicy foods - omeprazole (PRILOSEC) 40 MG capsule; Take 1 capsule (40 mg total) by mouth daily.  Dispense: 30 capsule; Refill: 3    Labs pending Health maintenance reviewed- refuses all health maintenance Diet and exercise encouraged Continue all meds Follow up  In 6 months   Mary-Margaret Martin, FNP    

## 2015-01-22 LAB — CMP14+EGFR
ALK PHOS: 98 IU/L (ref 39–117)
ALT: 14 IU/L (ref 0–32)
AST: 21 IU/L (ref 0–40)
Albumin/Globulin Ratio: 1.7 (ref 1.1–2.5)
Albumin: 4.4 g/dL (ref 3.5–4.7)
BUN / CREAT RATIO: 17 (ref 11–26)
BUN: 16 mg/dL (ref 8–27)
Bilirubin Total: 0.6 mg/dL (ref 0.0–1.2)
CHLORIDE: 99 mmol/L (ref 97–108)
CO2: 23 mmol/L (ref 18–29)
Calcium: 9.9 mg/dL (ref 8.7–10.3)
Creatinine, Ser: 0.96 mg/dL (ref 0.57–1.00)
GFR calc Af Amer: 61 mL/min/{1.73_m2} (ref >59)
GFR calc non Af Amer: 53 mL/min/{1.73_m2} — ABNORMAL LOW (ref >59)
Globulin, Total: 2.6 g/dL (ref 1.5–4.5)
Glucose: 91 mg/dL (ref 65–99)
Potassium: 4.6 mmol/L (ref 3.5–5.2)
SODIUM: 141 mmol/L (ref 134–144)
Total Protein: 7 g/dL (ref 6.0–8.5)

## 2015-01-22 LAB — NMR, LIPOPROFILE
CHOLESTEROL: 215 mg/dL — AB (ref 100–199)
HDL Cholesterol by NMR: 76 mg/dL (ref >39)
HDL Particle Number: 38.1 umol/L (ref >=30.5)
LDL PARTICLE NUMBER: 1284 nmol/L — AB (ref <1000)
LDL SIZE: 21.6 nm (ref >20.5)
LDL-C: 121 mg/dL — AB (ref 0–99)
LP-IR Score: <25 (ref <=45)
Small LDL Particle Number: <90 nmol/L (ref <=527)
Triglycerides by NMR: 88 mg/dL (ref 0–149)

## 2015-01-23 ENCOUNTER — Other Ambulatory Visit: Payer: Self-pay | Admitting: Nurse Practitioner

## 2015-01-28 ENCOUNTER — Telehealth: Payer: Self-pay | Admitting: Nurse Practitioner

## 2015-01-28 NOTE — Telephone Encounter (Signed)
Patient aware of results.

## 2015-05-06 ENCOUNTER — Ambulatory Visit (INDEPENDENT_AMBULATORY_CARE_PROVIDER_SITE_OTHER): Payer: BLUE CROSS/BLUE SHIELD | Admitting: Nurse Practitioner

## 2015-05-06 ENCOUNTER — Encounter: Payer: Self-pay | Admitting: Nurse Practitioner

## 2015-05-06 VITALS — BP 136/84 | HR 77 | Temp 96.5°F | Ht 65.0 in | Wt 103.0 lb

## 2015-05-06 DIAGNOSIS — M545 Low back pain: Secondary | ICD-10-CM | POA: Diagnosis not present

## 2015-05-06 DIAGNOSIS — K224 Dyskinesia of esophagus: Secondary | ICD-10-CM | POA: Diagnosis not present

## 2015-05-06 DIAGNOSIS — E785 Hyperlipidemia, unspecified: Secondary | ICD-10-CM

## 2015-05-06 DIAGNOSIS — W19XXXS Unspecified fall, sequela: Secondary | ICD-10-CM

## 2015-05-06 DIAGNOSIS — I10 Essential (primary) hypertension: Secondary | ICD-10-CM | POA: Diagnosis not present

## 2015-05-06 DIAGNOSIS — I639 Cerebral infarction, unspecified: Secondary | ICD-10-CM | POA: Diagnosis not present

## 2015-05-06 DIAGNOSIS — Z681 Body mass index (BMI) 19 or less, adult: Secondary | ICD-10-CM | POA: Diagnosis not present

## 2015-05-06 MED ORDER — AMLODIPINE BESYLATE 5 MG PO TABS: ORAL_TABLET | ORAL | Status: DC

## 2015-05-06 MED ORDER — OMEPRAZOLE 40 MG PO CPDR: 40.0000 mg | DELAYED_RELEASE_CAPSULE | Freq: Every day | ORAL | Status: DC

## 2015-05-06 NOTE — Patient Instructions (Signed)

## 2015-05-06 NOTE — Addendum Note (Signed)
Addended by: Chevis Pretty on: 05/06/2015 04:14 PM   Modules accepted: Orders

## 2015-05-06 NOTE — Progress Notes (Signed)
Subjective:    Patient ID: Belinda Webb, female    DOB: [birth year], 81 y.o.   MRN: [id number]  Hypertension This is a chronic problem. The current episode started more than 1 year ago. The problem is unchanged. The problem is uncontrolled. Pertinent negatives include no chest pain. Risk factors for coronary artery disease include dyslipidemia, post-menopausal state and sedentary lifestyle. Past treatments include calcium channel blockers. The current treatment provides moderate improvement. Compliance problems include diet and exercise.  Hypertensive end-organ damage includes CVA. There is no history of CAD/MI or retinopathy.  Hyperlipidemia This is a chronic problem. The current episode started more than 1 year ago. Recent lipid tests were reviewed and are variable. She has no history of diabetes, hypothyroidism or obesity. Associated symptoms include leg pain. Pertinent negatives include no chest pain or myalgias. She is currently on no antihyperlipidemic treatment (lipitor caused muscle aches). The current treatment provides moderate improvement of lipids. Compliance problems include adherence to diet and adherence to exercise.  Risk factors for coronary artery disease include dyslipidemia, hypertension, post-menopausal and a sedentary lifestyle.  Esophageal abnormality takes omeprazole daily- gets choked easily if does not eat slowly CVA history Only side effect if right hand weakness- other wise doing well Fall/chronic back pain Patient fell in 2011 and injured her back- she has had constant pain since then- nothing relieves her pain but pain meds and they make her dizzy so she will not take.    Review of Systems  Constitutional: Negative.   HENT: Negative.   Respiratory: Negative.   Cardiovascular: Negative.  Negative for chest pain.  Genitourinary: Negative.   Musculoskeletal: Positive for back pain. Negative for myalgias.  Neurological: Negative.   Psychiatric/Behavioral:  Negative.   All other systems reviewed and are negative.      Objective:   Physical Exam  Constitutional: She is oriented to person, place, and time. She appears well-developed and well-nourished.  HENT:  Nose: Nose normal.  Mouth/Throat: Oropharynx is clear and moist.  Eyes: EOM are normal.  Neck: Trachea normal, normal range of motion and full passive range of motion without pain. Neck supple. No JVD present. Carotid bruit is not present. No thyromegaly present.  Cardiovascular: Normal rate, regular rhythm, normal heart sounds and intact distal pulses.  Exam reveals no gallop and no friction rub.   No murmur heard. Pulmonary/Chest: Effort normal and breath sounds normal.  Abdominal: Soft. Bowel sounds are normal. She exhibits no distension and no mass. There is no tenderness.  Musculoskeletal: Normal range of motion.  Lymphadenopathy:    She has no cervical adenopathy.  Neurological: She is alert and oriented to person, place, and time. She has normal reflexes.  Skin: Skin is warm and dry.  Psychiatric: She has a normal mood and affect. Her behavior is normal. Judgment and thought content normal.    BP 136/84 mmHg  Pulse 77  Temp(Src) 96.5 F (35.8 C) (Oral)  Ht '5\' 5"'$  (1.651 m)  Wt 103 lb (46.72 kg)  BMI 17.14 kg/m2     Assessment & Plan:  1. Essential hypertension Do not add salt to diet - amLODipine (NORVASC) 5 MG tablet; TAKE 1 TABLET (5 MG TOTAL) BY MOUTH DAILY.  Dispense: 30 tablet; Refill: 5  2. Stroke Baby asa daily  3. Esophageal dysmotility Avoid spicy foods Do not eat 2 hours prior to bedtime  omeprazole (PRILOSEC) 40 MG capsule; Take 1 capsule (40 mg total) by mouth daily.  Dispense: 30 capsule; Refill: 3  4.  Fall, sequela Discussed fall precautions  5. BMI less than 19,adult Add boost or ensure to diet daily  6. Hyperlipidemia with target LDL less than 100 Low fat diet    Labs pending Health maintenance reviewed Diet and exercise  encouraged Continue all meds Follow up  In 3 months   [place], FNP

## 2015-05-07 LAB — LIPID PANEL
CHOL/HDL RATIO: 2.9 ratio (ref 0.0–4.4)
Cholesterol, Total: 236 mg/dL — ABNORMAL HIGH (ref 100–199)
HDL: 81 mg/dL (ref >39)
LDL Calculated: 132 mg/dL — ABNORMAL HIGH (ref 0–99)
TRIGLYCERIDES: 117 mg/dL (ref 0–149)
VLDL Cholesterol Cal: 23 mg/dL (ref 5–40)

## 2015-05-07 LAB — CMP14+EGFR
ALT: 12 IU/L (ref 0–32)
AST: 21 IU/L (ref 0–40)
Albumin/Globulin Ratio: 2 (ref 1.1–2.5)
Albumin: 4.6 g/dL (ref 3.5–4.7)
Alkaline Phosphatase: 51 IU/L (ref 39–117)
BILIRUBIN TOTAL: 0.6 mg/dL (ref 0.0–1.2)
BUN/Creatinine Ratio: 21 (ref 11–26)
BUN: 21 mg/dL (ref 8–27)
CALCIUM: 9.8 mg/dL (ref 8.7–10.3)
CHLORIDE: 99 mmol/L (ref 97–108)
CO2: 24 mmol/L (ref 18–29)
Creatinine, Ser: 0.98 mg/dL (ref 0.57–1.00)
GFR calc Af Amer: 60 mL/min/{1.73_m2} (ref >59)
GFR calc non Af Amer: 52 mL/min/{1.73_m2} — ABNORMAL LOW (ref >59)
GLUCOSE: 89 mg/dL (ref 65–99)
Globulin, Total: 2.3 g/dL (ref 1.5–4.5)
POTASSIUM: 4.1 mmol/L (ref 3.5–5.2)
SODIUM: 140 mmol/L (ref 134–144)
Total Protein: 6.9 g/dL (ref 6.0–8.5)

## 2015-12-29 DIAGNOSIS — Z0289 Encounter for other administrative examinations: Secondary | ICD-10-CM

## 2016-01-14 ENCOUNTER — Encounter: Payer: Self-pay | Admitting: Family Medicine

## 2016-01-14 ENCOUNTER — Other Ambulatory Visit: Payer: Self-pay | Admitting: Nurse Practitioner

## 2016-01-14 ENCOUNTER — Ambulatory Visit (INDEPENDENT_AMBULATORY_CARE_PROVIDER_SITE_OTHER): Payer: Medicare Other | Admitting: Family Medicine

## 2016-01-14 VITALS — BP 145/76 | HR 65 | Temp 96.8°F | Wt 107.6 lb

## 2016-01-14 DIAGNOSIS — Z8673 Personal history of transient ischemic attack (TIA), and cerebral infarction without residual deficits: Secondary | ICD-10-CM | POA: Diagnosis not present

## 2016-01-14 DIAGNOSIS — I10 Essential (primary) hypertension: Secondary | ICD-10-CM | POA: Diagnosis not present

## 2016-01-14 DIAGNOSIS — I63519 Cerebral infarction due to unspecified occlusion or stenosis of unspecified middle cerebral artery: Secondary | ICD-10-CM

## 2016-01-14 NOTE — Progress Notes (Signed)
   Subjective:    Patient ID: Belinda Webb, female    DOB: 09-10-1925, 82 y.o.   MRN: [id number]  HPI 82 year old female here for a physical at the request of her insurance carrier. She has hypertension and has had multiple skin cancers. She had a fall 5 or 6 years ago and has had back pain since then despite kyphoplasty. There is a history of esophageal dysmotility but she denies any dysphagia, just hoarseness. For the back pain, she uses tramadol or hydrocodone occasionally, especially after she has pertussis petted in her senior games. Appetite is good. She has gained 5 pounds since her last visit here back in the summer. She lives by herself. She has medical alert. He has had no recent falls.  Patient Active Problem List   Diagnosis Date Noted  . BMI less than 19,adult 05/06/2015  . Cancer of chest (wall) ([place]) 01/21/2015  . Hypertension 04/16/2012  . Fall 04/15/2012  . Hoarseness 11/22/2011  . Esophageal dysmotility 11/22/2011  . Chronic cough   . Stroke [facility])    Outpatient Encounter Prescriptions as of 01/14/2016  Medication Sig  . amLODipine (NORVASC) 5 MG tablet TAKE 1 TABLET (5 MG TOTAL) BY MOUTH DAILY.  Marland Kitchen HYDROcodone-acetaminophen (NORCO/VICODIN) 5-325 MG tablet Take 1 tablet by mouth every 6 (six) hours as needed for moderate pain (very rare use).  . meclizine (ANTIVERT) 25 MG tablet Take 1 tablet (25 mg total) by mouth 3 (three) times daily as needed.  . traMADol (ULTRAM) 50 MG tablet Take by mouth 2 (two) times daily as needed.  . loratadine (CLARITIN) 10 MG tablet Take 10 mg by mouth daily. Reported on 01/14/2016  . [DISCONTINUED] omeprazole (PRILOSEC) 40 MG capsule Take 1 capsule (40 mg total) by mouth daily.   No facility-administered encounter medications on file as of 01/14/2016.      Review of Systems  Constitutional: Negative.   HENT: Negative.   Respiratory: Negative.   Cardiovascular: Negative.   Gastrointestinal: Positive for constipation.  Musculoskeletal:  Positive for back pain.  Psychiatric/Behavioral: Negative.        Objective:   Physical Exam  Constitutional: She is oriented to person, place, and time. She appears well-developed and well-nourished.  HENT:  Head: Normocephalic.  Eyes: Pupils are equal, round, and reactive to light.  Cardiovascular: Normal rate, regular rhythm and normal heart sounds.   Pulmonary/Chest: Effort normal and breath sounds normal.  Abdominal: Soft. There is no tenderness. There is no rebound.  Musculoskeletal: Normal range of motion.  Neurological: She is alert and oriented to person, place, and time.  Psychiatric: She has a normal mood and affect.  Memory is fairly good for age. Recalls 2 out of 3 words at 5 minutes          Assessment & Plan:  1. Essential hypertension Pressure is well controlled on amlodipine  2. Cerebrovascular accident (CVA) due to occlusion of middle cerebral artery, unspecified blood vessel laterality ([place]) She has recovered from stroke with no sequelae  Wardell Honour MD

## 2016-04-07 DIAGNOSIS — G8929 Other chronic pain: Secondary | ICD-10-CM | POA: Diagnosis not present

## 2016-04-07 DIAGNOSIS — G894 Chronic pain syndrome: Secondary | ICD-10-CM | POA: Diagnosis not present

## 2016-04-07 DIAGNOSIS — M5441 Lumbago with sciatica, right side: Secondary | ICD-10-CM | POA: Diagnosis not present

## 2016-04-07 DIAGNOSIS — M47816 Spondylosis without myelopathy or radiculopathy, lumbar region: Secondary | ICD-10-CM | POA: Diagnosis not present

## 2016-06-13 DIAGNOSIS — Z961 Presence of intraocular lens: Secondary | ICD-10-CM | POA: Diagnosis not present

## 2016-06-13 DIAGNOSIS — H04123 Dry eye syndrome of bilateral lacrimal glands: Secondary | ICD-10-CM | POA: Diagnosis not present

## 2016-06-16 DIAGNOSIS — H903 Sensorineural hearing loss, bilateral: Secondary | ICD-10-CM | POA: Diagnosis not present

## 2016-07-12 ENCOUNTER — Other Ambulatory Visit: Payer: Self-pay | Admitting: Family Medicine

## 2016-07-12 ENCOUNTER — Encounter: Payer: Self-pay | Admitting: *Deleted

## 2016-07-20 DIAGNOSIS — L821 Other seborrheic keratosis: Secondary | ICD-10-CM | POA: Diagnosis not present

## 2016-07-20 DIAGNOSIS — Z85828 Personal history of other malignant neoplasm of skin: Secondary | ICD-10-CM | POA: Diagnosis not present

## 2016-07-20 DIAGNOSIS — L814 Other melanin hyperpigmentation: Secondary | ICD-10-CM | POA: Diagnosis not present

## 2016-07-20 DIAGNOSIS — D225 Melanocytic nevi of trunk: Secondary | ICD-10-CM | POA: Diagnosis not present

## 2016-07-20 DIAGNOSIS — D1801 Hemangioma of skin and subcutaneous tissue: Secondary | ICD-10-CM | POA: Diagnosis not present

## 2016-07-25 NOTE — Progress Notes (Signed)
   Subjective:    Patient ID: Belinda Webb, female    DOB: [birth year], 82 y.o.   MRN: [id number]  HPI he 36-year-old female with chronic back pain. She declines to take much if anything for the pain because she has seen her goddaughter with addiction problems and that influences her right leg. She cooks but does not cleaning due to the pain. She has previously seen orthopedics in [place] who said there is nothing else to do. Pain started with a fall several years back. She had kyphoplasty.  Patient Active Problem List   Diagnosis Date Noted  . BMI less than 19,adult 05/06/2015  . Cancer of chest (wall) ([place]) 01/21/2015  . Hypertension 04/16/2012  . Fall 04/15/2012  . Hoarseness 11/22/2011  . Esophageal dysmotility 11/22/2011  . Chronic cough   . Stroke [facility])    Outpatient Encounter Prescriptions as of 07/26/2016  Medication Sig  . amLODipine (NORVASC) 5 MG tablet TAKE 1 TABLET (5 MG TOTAL) BY MOUTH DAILY.  . traMADol (ULTRAM) 50 MG tablet Take by mouth 2 (two) times daily as needed.  . [DISCONTINUED] HYDROcodone-acetaminophen (NORCO/VICODIN) 5-325 MG tablet Take 1 tablet by mouth every 6 (six) hours as needed for moderate pain (very rare use).  Marland Kitchen loratadine (CLARITIN) 10 MG tablet Take 10 mg by mouth daily. Reported on 01/14/2016  . meclizine (ANTIVERT) 25 MG tablet Take 1 tablet (25 mg total) by mouth 3 (three) times daily as needed. (Patient not taking: Reported on 07/26/2016)   No facility-administered encounter medications on file as of 07/26/2016.       Review of Systems  Constitutional: Negative.   Respiratory: Negative.   Cardiovascular: Negative.   Genitourinary: Negative.   Musculoskeletal: Positive for back pain.  Neurological: Negative.   Psychiatric/Behavioral: Negative.        Objective:   Physical Exam  Constitutional: She is oriented to person, place, and time. She appears well-developed and well-nourished.  Cardiovascular: Normal rate, regular rhythm and  normal heart sounds.   Pulmonary/Chest: Effort normal and breath sounds normal.  Musculoskeletal: She exhibits no edema.  Neurological: She is alert and oriented to person, place, and time.  Psychiatric: She has a normal mood and affect. Her behavior is normal.   BP (!) 154/77   Pulse 79   Temp 97 F (36.1 C) (Oral)   Ht 5' 5" (1.651 m)   Wt 110 lb (49.9 kg)   BMI 18.30 kg/m         Assessment & Plan:  1. Essential hypertension Blood pressure is okay (maybe slightly elevated). We'll check renal function with BMP. Sugar was elevated in the prediabetic range when last checked 6 months ago. - CBC with Differential/Platelet - BMP8+EGFR  Wardell Honour MD

## 2016-07-26 ENCOUNTER — Ambulatory Visit (INDEPENDENT_AMBULATORY_CARE_PROVIDER_SITE_OTHER): Payer: Medicare Other | Admitting: Family Medicine

## 2016-07-26 ENCOUNTER — Encounter: Payer: Self-pay | Admitting: Family Medicine

## 2016-07-26 VITALS — BP 140/75 | HR 59 | Temp 97.0°F | Ht 65.0 in | Wt 110.0 lb

## 2016-07-26 DIAGNOSIS — I10 Essential (primary) hypertension: Secondary | ICD-10-CM | POA: Diagnosis not present

## 2016-07-27 LAB — CBC WITH DIFFERENTIAL/PLATELET
BASOS ABS: 0 10*3/uL (ref 0.0–0.2)
Basos: 1 %
EOS (ABSOLUTE): 0.1 10*3/uL (ref 0.0–0.4)
Eos: 1 %
HEMOGLOBIN: 13.5 g/dL (ref 11.1–15.9)
Hematocrit: 40.4 % (ref 34.0–46.6)
IMMATURE GRANS (ABS): 0 10*3/uL (ref 0.0–0.1)
IMMATURE GRANULOCYTES: 0 %
LYMPHS: 43 %
Lymphocytes Absolute: 3 10*3/uL (ref 0.7–3.1)
MCH: 32.2 pg (ref 26.6–33.0)
MCHC: 33.4 g/dL (ref 31.5–35.7)
MCV: 96 fL (ref 79–97)
Monocytes Absolute: 0.8 10*3/uL (ref 0.1–0.9)
Monocytes: 12 %
NEUTROS PCT: 43 %
Neutrophils Absolute: 3.1 10*3/uL (ref 1.4–7.0)
Platelets: 251 10*3/uL (ref 150–379)
RBC: 4.19 x10E6/uL (ref 3.77–5.28)
RDW: 13.1 % (ref 12.3–15.4)
WBC: 7 10*3/uL (ref 3.4–10.8)

## 2016-07-27 LAB — BMP8+EGFR
BUN/Creatinine Ratio: 24 (ref 12–28)
BUN: 22 mg/dL (ref 8–27)
CO2: 25 mmol/L (ref 18–29)
CREATININE: 0.92 mg/dL (ref 0.57–1.00)
Calcium: 9.1 mg/dL (ref 8.7–10.3)
Chloride: 102 mmol/L (ref 96–106)
GFR calc Af Amer: 64 mL/min/{1.73_m2} (ref >59)
GFR calc non Af Amer: 55 mL/min/{1.73_m2} — ABNORMAL LOW (ref >59)
GLUCOSE: 84 mg/dL (ref 65–99)
Potassium: 4.2 mmol/L (ref 3.5–5.2)
SODIUM: 141 mmol/L (ref 134–144)

## 2016-07-28 ENCOUNTER — Ambulatory Visit: Payer: BLUE CROSS/BLUE SHIELD | Admitting: Family Medicine

## 2016-08-10 ENCOUNTER — Other Ambulatory Visit: Payer: Self-pay | Admitting: Family Medicine

## 2016-08-24 ENCOUNTER — Ambulatory Visit (INDEPENDENT_AMBULATORY_CARE_PROVIDER_SITE_OTHER): Payer: Medicare Other | Admitting: Family Medicine

## 2016-08-24 ENCOUNTER — Encounter: Payer: Self-pay | Admitting: Family Medicine

## 2016-08-24 VITALS — BP 148/69 | HR 69 | Temp 97.6°F | Ht 65.0 in | Wt 110.6 lb

## 2016-08-24 DIAGNOSIS — H00015 Hordeolum externum left lower eyelid: Secondary | ICD-10-CM | POA: Diagnosis not present

## 2016-08-24 MED ORDER — DOXYCYCLINE HYCLATE 100 MG PO TABS: 100.0000 mg | ORAL_TABLET | Freq: Two times a day (BID) | ORAL | 0 refills | Status: DC

## 2016-08-24 MED ORDER — TOBRAMYCIN-DEXAMETHASONE 0.3-0.1 % OP SUSP: OPHTHALMIC | 0 refills | Status: DC

## 2016-08-24 NOTE — Progress Notes (Signed)
Subjective:  Patient ID: Belinda Webb, female    DOB: 02/12/1925  Age: 82 y.o. MRN: [id number]  CC: Eye Problem (left eye redness, stye, with drainage started a week ago)   HPI Belinda Webb presents for sx as above. Denies change in sight. No blurred vision. There is some pain at the upper lid.   History Belinda Webb has a past medical history of Chronic cough; HLD (hyperlipidemia); HTN (hypertension); Lung tumor (1984); Stroke [facility]); and Vertigo.   She has a past surgical history that includes Back surgery; Total abdominal hysterectomy (1947); Lung surgery (1984); Appendectomy (1938); Breast biopsy; Tonsillectomy and adenoidectomy; and Cataract extraction, bilateral.   Her family history includes Colon polyps in her son; Diabetes in her father; Emphysema in her brother; Hypertension in her father; Lung cancer in her brother; Stroke in her mother; Tuberculosis in her sister.She reports that she quit smoking about 33 years ago. Her smoking use included Cigarettes. She has a 20.00 pack-year smoking history. She has never used smokeless tobacco. She reports that she does not drink alcohol or use drugs.    ROS Review of Systems Negative except as in history of present illness Objective:  BP (!) 148/69   Pulse 69   Temp 97.6 F (36.4 C) (Oral)   Ht '5\' 5"'$  (1.651 m)   Wt 110 lb 9.6 oz (50.2 kg)   BMI 18.40 kg/m   BP Readings from Last 3 Encounters:  08/24/16 (!) 148/69  07/26/16 140/75  01/14/16 (!) 145/76    Wt Readings from Last 3 Encounters:  08/24/16 110 lb 9.6 oz (50.2 kg)  07/26/16 110 lb (49.9 kg)  01/14/16 107 lb 9.6 oz (48.8 kg)     Physical Exam  Eyes: Pupils are equal, round, and reactive to light. Right eye exhibits no chemosis, no discharge, no exudate and no hordeolum. No foreign body present in the right eye. Left eye exhibits chemosis, exudate and hordeolum. Left eye exhibits no discharge. No foreign body present in the left eye.  Neck: Normal range of  motion.  Lymphadenopathy:    She has cervical adenopathy.     Lab Results  Component Value Date   WBC 7.0 07/26/2016   HGB 13.8 12/09/2013   HCT 40.4 07/26/2016   PLT 251 07/26/2016   GLUCOSE 84 07/26/2016   CHOL 236 (H) 05/06/2015   TRIG 117 05/06/2015   HDL 81 05/06/2015   LDLCALC 132 (H) 05/06/2015   ALT 12 05/06/2015   AST 21 05/06/2015   NA 141 07/26/2016   K 4.2 07/26/2016   CL 102 07/26/2016   CREATININE 0.92 07/26/2016   BUN 22 07/26/2016   CO2 25 07/26/2016   INR 1.1 07/10/2013   HGBA1C  12/24/2009    5.6 (NOTE) The ADA recommends the following therapeutic goal for glycemic control related to Hgb A1c measurement: Goal of therapy: <6.5 Hgb A1c  Reference: American Diabetes Association: Clinical Practice Recommendations 2010, Diabetes Care, 2010, 33: (Suppl  1).    Mm Digital Screening  Result Date: 04/23/2013 *RADIOLOGY REPORT* Clinical Data: Screening. DIGITAL SCREENING BILATERAL MAMMOGRAM WITH CAD Comparison:  Previous exam(s). FINDINGS: ACR Breast Density Category d:  The breasts are extremely dense, which lowers the sensitivity of mammography. There are no findings suspicious for malignancy. Images were processed with CAD. IMPRESSION: No mammographic evidence of malignancy. A result letter of this screening mammogram will be mailed directly to the patient. RECOMMENDATION: Screening mammogram in one year. (Code:SM-B-01Y) BI-RADS CATEGORY 2:  Benign finding(s). Original  Report Authenticated By: Abelardo Diesel, M.D.    Assessment & Plan:   Belinda Webb was seen today for eye problem.  Diagnoses and all orders for this visit:  Hordeolum externum of left lower eyelid  Other orders -     tobramycin-dexamethasone (TOBRADEX) ophthalmic solution; Apply 1 drop in affected eye(s) every 2 hours for two days. Then every 4 hours for 5 days. -     doxycycline (VIBRA-TABS) 100 MG tablet; Take 1 tablet (100 mg total) by mouth 2 (two) times daily.   I am having Belinda Webb start on  tobramycin-dexamethasone and doxycycline. I am also having her maintain her meclizine, loratadine, traMADol, and amLODipine.  Meds ordered this encounter  Medications  . tobramycin-dexamethasone (TOBRADEX) ophthalmic solution    Sig: Apply 1 drop in affected eye(s) every 2 hours for two days. Then every 4 hours for 5 days.    Dispense:  5 mL    Refill:  0  . doxycycline (VIBRA-TABS) 100 MG tablet    Sig: Take 1 tablet (100 mg total) by mouth 2 (two) times daily.    Dispense:  20 tablet    Refill:  0     Follow-up: Return if symptoms worsen or fail to improve.  Claretta Fraise, M.D.

## 2016-12-12 ENCOUNTER — Ambulatory Visit (INDEPENDENT_AMBULATORY_CARE_PROVIDER_SITE_OTHER): Payer: Medicare Other | Admitting: Family Medicine

## 2016-12-12 ENCOUNTER — Encounter: Payer: Self-pay | Admitting: Family Medicine

## 2016-12-12 VITALS — BP 136/77 | HR 68 | Temp 96.7°F | Ht 65.0 in | Wt 114.0 lb

## 2016-12-12 DIAGNOSIS — I10 Essential (primary) hypertension: Secondary | ICD-10-CM | POA: Diagnosis not present

## 2016-12-12 DIAGNOSIS — R600 Localized edema: Secondary | ICD-10-CM | POA: Diagnosis not present

## 2016-12-12 MED ORDER — TRIAMTERENE-HCTZ 37.5-25 MG PO TABS: 1.0000 | ORAL_TABLET | Freq: Every day | ORAL | 3 refills | Status: DC

## 2016-12-12 NOTE — Progress Notes (Signed)
Subjective:  Patient ID: Belinda Webb, female    DOB: 1925/01/18  Age: 83 y.o. MRN: [id number]  CC: Medication Problem (pt here today c/o swelling and she thinks it is because the Norvasc)   HPI Belinda Webb presents for Swelling in the feet and ankles for couple months. Her Norvasc changed colors at the pharmacy a couple months ago and that seems to be when it all started. She says it's causing her feet hurt. And she would like to have something done to help. She tried splitting the pill in half but it crumbled so she couldn't do that. Otherwise her blood pressure has been doing well at home. No elevated readings recently. However it was elevated here as noted below as well as at her previous visit.    History Belinda Webb has a past medical history of Chronic cough; HLD (hyperlipidemia); HTN (hypertension); Lung tumor (1984); Stroke [facility]); and Vertigo.   She has a past surgical history that includes Back surgery; Total abdominal hysterectomy (1947); Lung surgery (1984); Appendectomy (1938); Breast biopsy; Tonsillectomy and adenoidectomy; and Cataract extraction, bilateral.   Her family history includes Colon polyps in her son; Diabetes in her father; Emphysema in her brother; Hypertension in her father; Lung cancer in her brother; Stroke in her mother; Tuberculosis in her sister.She reports that she quit smoking about 34 years ago. Her smoking use included Cigarettes. She has a 20.00 pack-year smoking history. She has never used smokeless tobacco. She reports that she does not drink alcohol or use drugs.    ROS Review of Systems  Constitutional: Negative for activity change, appetite change and fever.  HENT: Negative for congestion, rhinorrhea and sore throat.   Eyes: Negative for visual disturbance.  Respiratory: Negative for cough and shortness of breath.   Cardiovascular: Negative for chest pain and palpitations.  Gastrointestinal: Negative for abdominal pain, diarrhea and nausea.    Genitourinary: Negative for dysuria.  Musculoskeletal: Negative for arthralgias and myalgias.    Objective:  BP (!) 148/69   Pulse 68   Temp (!) 96.7 F (35.9 C) (Oral)   Ht '5\' 5"'$  (1.651 m)   Wt 114 lb (51.7 kg)   BMI 18.97 kg/m   BP Readings from Last 3 Encounters:  12/12/16 (!) 148/69  08/24/16 (!) 148/69  07/26/16 140/75    Wt Readings from Last 3 Encounters:  12/12/16 114 lb (51.7 kg)  08/24/16 110 lb 9.6 oz (50.2 kg)  07/26/16 110 lb (49.9 kg)     Physical Exam  Constitutional: She is oriented to person, place, and time. She appears well-developed and well-nourished. No distress.  HENT:  Head: Normocephalic and atraumatic.  Eyes: Conjunctivae are normal. Pupils are equal, round, and reactive to light.  Neck: Normal range of motion. Neck supple. No thyromegaly present.  Cardiovascular: Normal rate, regular rhythm and normal heart sounds.   No murmur heard. Pulmonary/Chest: Effort normal and breath sounds normal. No respiratory distress. She has no wheezes. She has no rales.  Abdominal: Soft. Bowel sounds are normal. She exhibits no distension. There is no tenderness.  Musculoskeletal: Normal range of motion.  Lymphadenopathy:    She has no cervical adenopathy.  Neurological: She is alert and oriented to person, place, and time.  Skin: Skin is warm and dry.  Psychiatric: She has a normal mood and affect. Her behavior is normal. Judgment and thought content normal.    Mm Digital Screening  Result Date: 04/23/2013 *RADIOLOGY REPORT* Clinical Data: Screening. DIGITAL SCREENING BILATERAL MAMMOGRAM WITH  CAD Comparison:  Previous exam(s). FINDINGS: ACR Breast Density Category d:  The breasts are extremely dense, which lowers the sensitivity of mammography. There are no findings suspicious for malignancy. Images were processed with CAD. IMPRESSION: No mammographic evidence of malignancy. A result letter of this screening mammogram will be mailed directly to the patient.  RECOMMENDATION: Screening mammogram in one year. (Code:SM-B-01Y) BI-RADS CATEGORY 2:  Benign finding(s). Original Report Authenticated By: Abelardo Diesel, M.D.    Assessment & Plan:   Belinda Webb was seen today for medication problem.  Diagnoses and all orders for this visit:  Essential hypertension  Edema of both feet  Other orders -     triamterene-hydrochlorothiazide (MAXZIDE-25) 37.5-25 MG tablet; Take 1 tablet by mouth daily. For blood pressure and fluid      I have discontinued Ms. Drewry's tobramycin-dexamethasone and doxycycline. I am also having her start on triamterene-hydrochlorothiazide. Additionally, I am having her maintain her meclizine, loratadine, traMADol, and amLODipine.  Allergies as of 12/12/2016      Reactions   Lipitor [atorvastatin Calcium] Other (See Comments)   Reaction unknown   Nitrofurantoin Monohyd Macro Other (See Comments)   Reaction unknown   Penicillins Other (See Comments)   Reaction unknown   Plavix [clopidogrel Bisulfate] Other (See Comments)   Reaction unknown   Statins    REACTION: effects muscles   Sulfa Antibiotics Other (See Comments)   Reaction unknown      Medication List       Accurate as of 12/12/16  3:49 PM. Always use your most recent med list.          amLODipine 5 MG tablet Commonly known as:  NORVASC TAKE 1 TABLET (5 MG TOTAL) BY MOUTH DAILY.   loratadine 10 MG tablet Commonly known as:  CLARITIN Take 10 mg by mouth daily. Reported on 01/14/2016   meclizine 25 MG tablet Commonly known as:  ANTIVERT Take 1 tablet (25 mg total) by mouth 3 (three) times daily as needed.   traMADol 50 MG tablet Commonly known as:  ULTRAM Take by mouth 2 (two) times daily as needed.   triamterene-hydrochlorothiazide 37.5-25 MG tablet Commonly known as:  MAXZIDE-25 Take 1 tablet by mouth daily. For blood pressure and fluid        Follow-up: Return in about 3 months (around 03/11/2017).  Claretta Fraise, M.D.

## 2017-02-09 ENCOUNTER — Other Ambulatory Visit: Payer: Self-pay | Admitting: Family Medicine

## 2017-03-29 ENCOUNTER — Telehealth: Payer: Self-pay | Admitting: Nurse Practitioner

## 2017-03-29 MED ORDER — MECLIZINE HCL 25 MG PO TABS: 25.0000 mg | ORAL_TABLET | Freq: Three times a day (TID) | ORAL | 1 refills | Status: DC | PRN

## 2017-03-29 NOTE — Telephone Encounter (Signed)
Is the dizziness new? She needs to be seen if it is new, or worse or different than what it has been. Otherwise OK to send in meclizine 25mg  TID prn #60 tabs.

## 2017-03-29 NOTE — Telephone Encounter (Signed)
Son Aware Rx has been sent to pharmacy.   Dizziness is not new, no changes.

## 2017-03-30 ENCOUNTER — Encounter: Payer: Self-pay | Admitting: Physician Assistant

## 2017-03-30 ENCOUNTER — Ambulatory Visit (INDEPENDENT_AMBULATORY_CARE_PROVIDER_SITE_OTHER): Payer: Medicare Other | Admitting: Physician Assistant

## 2017-03-30 VITALS — BP 168/80 | HR 66 | Temp 96.5°F | Ht 65.0 in | Wt 111.0 lb

## 2017-03-30 DIAGNOSIS — Z87898 Personal history of other specified conditions: Secondary | ICD-10-CM | POA: Diagnosis not present

## 2017-03-30 DIAGNOSIS — S39012A Strain of muscle, fascia and tendon of lower back, initial encounter: Secondary | ICD-10-CM

## 2017-03-30 MED ORDER — PREDNISONE 10 MG (21) PO TBPK: ORAL_TABLET | ORAL | 0 refills | Status: DC

## 2017-03-30 NOTE — Patient Instructions (Signed)
In a few days you may receive a survey in the mail or online from [employer] regarding your visit with us today. Please take a moment to fill this out. Your feedback is very important to our whole office. It can help us better understand your needs as well as improve your experience and satisfaction. Thank you for taking your time to complete it. We care about you.  Ryley Teater, PA-C  

## 2017-04-03 NOTE — Progress Notes (Signed)
BP (!) 168/80   Pulse 66   Temp (!) 96.5 F (35.8 C) (Oral)   Ht 5\' 5"  (1.651 m)   Wt 111 lb (50.3 kg)   BMI 18.47 kg/m    Subjective:    Patient ID: Belinda Webb, female    DOB: 09-11-1925, 83 y.o.   MRN: [id number]  HPI: Belinda Webb is a 83 y.o. female presenting on 03/30/2017 for Difficulty Walking; feel and leg pain; and Dizziness  Had fall a couple of weeks ago and increasing tightness in low back. Denies deformity of crepitus. No significant bruising. Hard to get comfortable in the bed. She has known vertigo but denies it being the cause of the fall. Accompanied by her friend.  Relevant past medical, surgical, family and social history reviewed and updated as indicated. Allergies and medications reviewed and updated.  Past Medical History:  Diagnosis Date  . Chronic cough   . HLD (hyperlipidemia)   . HTN (hypertension)   . Lung tumor 1984   LUL  . Stroke ([place])   . Vertigo     Past Surgical History:  Procedure Laterality Date  . APPENDECTOMY  1938  . BACK SURGERY    . BREAST BIOPSY     left  . CATARACT EXTRACTION, BILATERAL    . LUNG SURGERY  1984   LUL   . TONSILLECTOMY AND ADENOIDECTOMY    . TOTAL ABDOMINAL HYSTERECTOMY  1947    Review of Systems  Constitutional: Negative.  Negative for activity change, fatigue and fever.  HENT: Negative.   Eyes: Negative.   Respiratory: Negative.  Negative for cough.   Cardiovascular: Negative.  Negative for chest pain.  Gastrointestinal: Negative.  Negative for abdominal pain.  Endocrine: Negative.   Genitourinary: Negative.  Negative for dysuria.  Musculoskeletal: Positive for arthralgias, back pain, gait problem and myalgias.  Skin: Negative.     Allergies as of 03/30/2017      Reactions   Lipitor [atorvastatin Calcium] Other (See Comments)   Reaction unknown   Nitrofurantoin Monohyd Macro Other (See Comments)   Reaction unknown   Penicillins Other (See Comments)   Reaction unknown   Plavix  [clopidogrel Bisulfate] Other (See Comments)   Reaction unknown   Statins    REACTION: effects muscles   Sulfa Antibiotics Other (See Comments)   Reaction unknown      Medication List       Accurate as of 03/30/17 11:59 PM. Always use your most recent med list.          amLODipine 5 MG tablet Commonly known as:  NORVASC TAKE 1 TABLET (5 MG TOTAL) BY MOUTH DAILY.   loratadine 10 MG tablet Commonly known as:  CLARITIN Take 10 mg by mouth daily. Reported on 01/14/2016   meclizine 25 MG tablet Commonly known as:  ANTIVERT Take 1 tablet (25 mg total) by mouth 3 (three) times daily as needed.   predniSONE 10 MG (21) Tbpk tablet Commonly known as:  STERAPRED UNI-PAK 21 TAB As directed x 6 days   traMADol 50 MG tablet Commonly known as:  ULTRAM Take by mouth 2 (two) times daily as needed.          Objective:    BP (!) 168/80   Pulse 66   Temp (!) 96.5 F (35.8 C) (Oral)   Ht 5\' 5"  (1.651 m)   Wt 111 lb (50.3 kg)   BMI 18.47 kg/m   Allergies  Allergen Reactions  . Lipitor [Atorvastatin Calcium]  Other (See Comments)    Reaction unknown  . Nitrofurantoin Monohyd Macro Other (See Comments)    Reaction unknown  . Penicillins Other (See Comments)    Reaction unknown  . Plavix [Clopidogrel Bisulfate] Other (See Comments)    Reaction unknown  . Statins     REACTION: effects muscles  . Sulfa Antibiotics Other (See Comments)    Reaction unknown    Physical Exam  Constitutional: She is oriented to person, place, and time. She appears well-developed and well-nourished.  HENT:  Head: Normocephalic and atraumatic.  Eyes: Conjunctivae and EOM are normal. Pupils are equal, round, and reactive to light.  Cardiovascular: Normal rate, regular rhythm, normal heart sounds and intact distal pulses.   Pulmonary/Chest: Effort normal and breath sounds normal.  Abdominal: Soft. Bowel sounds are normal.  Musculoskeletal:       Lumbar back: She exhibits tenderness, pain and  spasm. She exhibits no deformity and no laceration.  Neurological: She is alert and oriented to person, place, and time. She has normal reflexes.  Skin: Skin is warm and dry. No rash noted.  Psychiatric: She has a normal mood and affect. Her behavior is normal. Judgment and thought content normal.        Assessment & Plan:   1. Back strain, initial encounter - predniSONE (STERAPRED UNI-PAK 21 TAB) 10 MG (21) TBPK tablet; As directed x 6 days  Dispense: 21 tablet; Refill: 0  2. History of vertigo   Current Outpatient Prescriptions:  .  amLODipine (NORVASC) 5 MG tablet, TAKE 1 TABLET (5 MG TOTAL) BY MOUTH DAILY., Disp: 30 tablet, Rfl: 1 .  traMADol (ULTRAM) 50 MG tablet, Take by mouth 2 (two) times daily as needed., Disp: , Rfl:  .  loratadine (CLARITIN) 10 MG tablet, Take 10 mg by mouth daily. Reported on 01/14/2016, Disp: , Rfl:  .  meclizine (ANTIVERT) 25 MG tablet, Take 1 tablet (25 mg total) by mouth 3 (three) times daily as needed. (Patient not taking: Reported on 03/30/2017), Disp: 60 tablet, Rfl: 1 .  predniSONE (STERAPRED UNI-PAK 21 TAB) 10 MG (21) TBPK tablet, As directed x 6 days, Disp: 21 tablet, Rfl: 0  Continue all other maintenance medications as listed above.  Follow up plan: Return in about 7 days (around 04/06/2017) for recheck.  Educational handout given for lumbar pain  Terald Sleeper PA-C [facility] [address]  [place], [place] [address] [phone]   04/03/2017, 11:28 AM

## 2017-04-07 ENCOUNTER — Other Ambulatory Visit: Payer: Self-pay | Admitting: Nurse Practitioner

## 2017-04-10 ENCOUNTER — Other Ambulatory Visit: Payer: Self-pay | Admitting: Physician Assistant

## 2017-04-10 ENCOUNTER — Ambulatory Visit (INDEPENDENT_AMBULATORY_CARE_PROVIDER_SITE_OTHER): Payer: Medicare Other | Admitting: Physician Assistant

## 2017-04-10 ENCOUNTER — Encounter: Payer: Self-pay | Admitting: Physician Assistant

## 2017-04-10 ENCOUNTER — Ambulatory Visit (INDEPENDENT_AMBULATORY_CARE_PROVIDER_SITE_OTHER): Payer: Medicare Other

## 2017-04-10 VITALS — BP 112/66 | HR 74 | Temp 97.4°F | Ht 65.0 in | Wt 109.8 lb

## 2017-04-10 DIAGNOSIS — M79604 Pain in right leg: Secondary | ICD-10-CM

## 2017-04-10 DIAGNOSIS — Z9181 History of falling: Secondary | ICD-10-CM | POA: Diagnosis not present

## 2017-04-10 DIAGNOSIS — M545 Low back pain, unspecified: Secondary | ICD-10-CM

## 2017-04-10 DIAGNOSIS — R29898 Other symptoms and signs involving the musculoskeletal system: Secondary | ICD-10-CM

## 2017-04-10 NOTE — Patient Instructions (Signed)
In a few days you may receive a survey in the mail or online from [employer] regarding your visit with us today. Please take a moment to fill this out. Your feedback is very important to our whole office. It can help us better understand your needs as well as improve your experience and satisfaction. Thank you for taking your time to complete it. We care about you.  Bedford Winsor, PA-C  

## 2017-04-11 DIAGNOSIS — Z9181 History of falling: Secondary | ICD-10-CM | POA: Insufficient documentation

## 2017-04-11 DIAGNOSIS — R29898 Other symptoms and signs involving the musculoskeletal system: Secondary | ICD-10-CM | POA: Insufficient documentation

## 2017-04-11 DIAGNOSIS — M79604 Pain in right leg: Secondary | ICD-10-CM | POA: Insufficient documentation

## 2017-04-11 DIAGNOSIS — M545 Low back pain: Secondary | ICD-10-CM | POA: Insufficient documentation

## 2017-04-11 NOTE — Progress Notes (Signed)
BP 112/66   Pulse 74   Temp 97.4 F (36.3 C) (Oral)   Ht _0  (1.651 m)   Wt 109 lb 12.8 oz (49.8 kg)   BMI 18.27 kg/m    Subjective:    Patient ID: Belinda Webb, female    DOB: 1924-12-03, 83 y.o.   MRN: [id number]  HPI: Belinda Webb is a 83 y.o. female presenting on 04/10/2017 for Follow-up (7 day recheck ); Dizziness; and Leg Pain (bilateral )  Fell last week and bumped arm but also twisted back. Pain and weakness in both lower legs are quite bad. Cannot hold herself up for very long. Has been using her walker and not using bathtub.  She had injury in 2011 here L2 had repair with kyphoplasty by Dr Nelva Bush.   Pain is not improving, need MRI and referral to [facility].  Patient accompanied by sons ans ready for Home health referral. Will be placed with [facility].  Relevant past medical, surgical, family and social history reviewed and updated as indicated. Allergies and medications reviewed and updated.  Past Medical History:  Diagnosis Date  . Chronic cough   . HLD (hyperlipidemia)   . HTN (hypertension)   . Lung tumor 1984   LUL  . Stroke ([place])   . Vertigo     Past Surgical History:  Procedure Laterality Date  . APPENDECTOMY  1938  . BACK SURGERY    . BREAST BIOPSY     left  . CATARACT EXTRACTION, BILATERAL    . LUNG SURGERY  1984   LUL   . TONSILLECTOMY AND ADENOIDECTOMY    . TOTAL ABDOMINAL HYSTERECTOMY  1947    Review of Systems  Constitutional: Negative for activity change, fatigue and fever.  HENT: Negative.   Eyes: Negative.   Respiratory: Negative.  Negative for cough.   Cardiovascular: Negative.  Negative for chest pain.  Gastrointestinal: Negative.  Negative for abdominal pain.  Endocrine: Negative.   Genitourinary: Negative.  Negative for dysuria.  Musculoskeletal: Positive for arthralgias and back pain.  Skin: Negative.   Neurological: Positive for syncope and weakness.    Allergies as of 04/10/2017      Reactions   Lipitor [atorvastatin  Calcium] Other (See Comments)   Reaction unknown   Nitrofurantoin Monohyd Macro Other (See Comments)   Reaction unknown   Penicillins Other (See Comments)   Reaction unknown   Plavix [clopidogrel Bisulfate] Other (See Comments)   Reaction unknown   Statins    REACTION: effects muscles   Sulfa Antibiotics Other (See Comments)   Reaction unknown      Medication List       Accurate as of 04/10/17 11:59 PM. Always use your most recent med list.          amLODipine 5 MG tablet Commonly known as:  NORVASC TAKE 1 TABLET (5 MG TOTAL) BY MOUTH DAILY.   loratadine 10 MG tablet Commonly known as:  CLARITIN Take 10 mg by mouth daily. Reported on 01/14/2016   meclizine 25 MG tablet Commonly known as:  ANTIVERT Take 1 tablet (25 mg total) by mouth 3 (three) times daily as needed.   traMADol 50 MG tablet Commonly known as:  ULTRAM Take by mouth 2 (two) times daily as needed.          Objective:    BP 112/66   Pulse 74   Temp 97.4 F (36.3 C) (Oral)   Ht _1  (1.651 m)   Wt 109 lb 12.8  oz (49.8 kg)   BMI 18.27 kg/m   Allergies  Allergen Reactions  . Lipitor [Atorvastatin Calcium] Other (See Comments)    Reaction unknown  . Nitrofurantoin Monohyd Macro Other (See Comments)    Reaction unknown  . Penicillins Other (See Comments)    Reaction unknown  . Plavix [Clopidogrel Bisulfate] Other (See Comments)    Reaction unknown  . Statins     REACTION: effects muscles  . Sulfa Antibiotics Other (See Comments)    Reaction unknown    Physical Exam  Constitutional: She is oriented to person, place, and time. She appears well-developed and well-nourished.  HENT:  Head: Normocephalic and atraumatic.  Right Ear: Tympanic membrane, external ear and ear canal normal.  Left Ear: Tympanic membrane, external ear and ear canal normal.  Nose: Nose normal. No rhinorrhea.  Mouth/Throat: Oropharynx is clear and moist and mucous membranes are normal. No oropharyngeal exudate or  posterior oropharyngeal erythema.  Eyes: Conjunctivae and EOM are normal. Pupils are equal, round, and reactive to light.  Neck: Normal range of motion. Neck supple.  Cardiovascular: Normal rate, regular rhythm, normal heart sounds and intact distal pulses.   Pulmonary/Chest: Effort normal and breath sounds normal.  Abdominal: Soft. Bowel sounds are normal.  Musculoskeletal: She exhibits tenderness and deformity.  Neurological: She is alert and oriented to person, place, and time. She has normal reflexes.  Skin: Skin is warm and dry. No rash noted.  Psychiatric: She has a normal mood and affect. Her behavior is normal. Judgment and thought content normal.    Results for orders placed or performed in visit on 07/26/16  CBC with Differential/Platelet  Result Value Ref Range   WBC 7.0 3.4 - 10.8 x10E3/uL   RBC 4.19 3.77 - 5.28 x10E6/uL   Hemoglobin 13.5 11.1 - 15.9 g/dL   Hematocrit 40.4 34.0 - 46.6 %   MCV 96 79 - 97 fL   MCH 32.2 26.6 - 33.0 pg   MCHC 33.4 31.5 - 35.7 g/dL   RDW 13.1 12.3 - 15.4 %   Platelets 251 150 - 379 x10E3/uL   Neutrophils 43 Not Estab. %   Lymphs 43 Not Estab. %   Monocytes 12 Not Estab. %   Eos 1 Not Estab. %   Basos 1 Not Estab. %   Neutrophils Absolute 3.1 1.4 - 7.0 x10E3/uL   Lymphocytes Absolute 3.0 0.7 - 3.1 x10E3/uL   Monocytes Absolute 0.8 0.1 - 0.9 x10E3/uL   EOS (ABSOLUTE) 0.1 0.0 - 0.4 x10E3/uL   Basophils Absolute 0.0 0.0 - 0.2 x10E3/uL   Immature Granulocytes 0 Not Estab. %   Immature Grans (Abs) 0.0 0.0 - 0.1 x10E3/uL  BMP8+EGFR  Result Value Ref Range   Glucose 84 65 - 99 mg/dL   BUN 22 8 - 27 mg/dL   Creatinine, Ser 0.92 0.57 - 1.00 mg/dL   GFR calc non Af Amer 55 (L) >59 mL/min/1.73   GFR calc Af Amer 64 >59 mL/min/1.73   BUN/Creatinine Ratio 24 12 - 28   Sodium 141 134 - 144 mmol/L   Potassium 4.2 3.5 - 5.2 mmol/L   Chloride 102 96 - 106 mmol/L   CO2 25 18 - 29 mmol/L   Calcium 9.1 8.7 - 10.3 mg/dL      Assessment & Plan:    1. Lumbar pain with radiation down both legs - MR Lumbar Spine Wo Contrast; Future - Ambulatory referral to Orthopedic Surgery  2. Weakness of both lower extremities - MR Lumbar Spine Wo  Contrast; Future - Ambulatory referral to Orthopedic Surgery  3. History of fall - MR Lumbar Spine Wo Contrast; Future - Ambulatory referral to Orthopedic Surgery  4. Risk for falls Sons are present Handrails in bathroom, shower seat, raised toilet seat  Current Outpatient Prescriptions:  .  amLODipine (NORVASC) 5 MG tablet, TAKE 1 TABLET (5 MG TOTAL) BY MOUTH DAILY., Disp: 30 tablet, Rfl: 1 .  loratadine (CLARITIN) 10 MG tablet, Take 10 mg by mouth daily. Reported on 01/14/2016, Disp: , Rfl:  .  meclizine (ANTIVERT) 25 MG tablet, Take 1 tablet (25 mg total) by mouth 3 (three) times daily as needed., Disp: 60 tablet, Rfl: 1 .  traMADol (ULTRAM) 50 MG tablet, Take by mouth 2 (two) times daily as needed., Disp: , Rfl:   Continue all other maintenance medications as listed above.  Follow up plan: Return in about 4 weeks (around 05/08/2017) for recheck.  Educational handout given for [place] PA-C [facility] [address]  [place], [place] [address] [phone]   04/11/2017, 8:24 PM

## 2017-04-12 ENCOUNTER — Other Ambulatory Visit: Payer: Self-pay

## 2017-04-12 DIAGNOSIS — R42 Dizziness and giddiness: Secondary | ICD-10-CM | POA: Diagnosis not present

## 2017-04-12 DIAGNOSIS — M545 Low back pain: Secondary | ICD-10-CM

## 2017-04-12 DIAGNOSIS — E785 Hyperlipidemia, unspecified: Secondary | ICD-10-CM | POA: Diagnosis not present

## 2017-04-12 DIAGNOSIS — R262 Difficulty in walking, not elsewhere classified: Secondary | ICD-10-CM | POA: Diagnosis not present

## 2017-04-12 DIAGNOSIS — M5489 Other dorsalgia: Secondary | ICD-10-CM | POA: Diagnosis not present

## 2017-04-12 DIAGNOSIS — I10 Essential (primary) hypertension: Secondary | ICD-10-CM | POA: Diagnosis not present

## 2017-04-18 ENCOUNTER — Ambulatory Visit (HOSPITAL_COMMUNITY)
Admission: RE | Admit: 2017-04-18 | Discharge: 2017-04-18 | Disposition: A | Discharge status: Home / Self Care | Payer: Medicare Other | Source: Ambulatory Visit | Attending: Physician Assistant | Admitting: Physician Assistant

## 2017-04-18 DIAGNOSIS — M48061 Spinal stenosis, lumbar region without neurogenic claudication: Secondary | ICD-10-CM | POA: Diagnosis not present

## 2017-04-18 DIAGNOSIS — M4856XA Collapsed vertebra, not elsewhere classified, lumbar region, initial encounter for fracture: Secondary | ICD-10-CM | POA: Insufficient documentation

## 2017-04-18 DIAGNOSIS — M5126 Other intervertebral disc displacement, lumbar region: Secondary | ICD-10-CM | POA: Insufficient documentation

## 2017-04-18 DIAGNOSIS — M545 Low back pain: Secondary | ICD-10-CM

## 2017-04-20 DIAGNOSIS — G894 Chronic pain syndrome: Secondary | ICD-10-CM | POA: Diagnosis not present

## 2017-04-20 DIAGNOSIS — M5136 Other intervertebral disc degeneration, lumbar region: Secondary | ICD-10-CM | POA: Diagnosis not present

## 2017-04-20 DIAGNOSIS — M47816 Spondylosis without myelopathy or radiculopathy, lumbar region: Secondary | ICD-10-CM | POA: Diagnosis not present

## 2017-05-08 ENCOUNTER — Ambulatory Visit: Payer: Medicare Other | Admitting: Nurse Practitioner

## 2017-05-15 ENCOUNTER — Ambulatory Visit (INDEPENDENT_AMBULATORY_CARE_PROVIDER_SITE_OTHER): Payer: Medicare Other | Admitting: Physician Assistant

## 2017-05-15 ENCOUNTER — Encounter: Payer: Self-pay | Admitting: Physician Assistant

## 2017-05-15 VITALS — BP 134/68 | HR 66 | Ht 65.0 in | Wt 107.4 lb

## 2017-05-15 DIAGNOSIS — M5136 Other intervertebral disc degeneration, lumbar region: Secondary | ICD-10-CM | POA: Diagnosis not present

## 2017-05-15 DIAGNOSIS — M545 Low back pain, unspecified: Secondary | ICD-10-CM

## 2017-05-15 DIAGNOSIS — M79604 Pain in right leg: Secondary | ICD-10-CM

## 2017-05-15 NOTE — Patient Instructions (Signed)
In a few days you may receive a survey in the mail or online from [employer] regarding your visit with us today. Please take a moment to fill this out. Your feedback is very important to our whole office. It can help us better understand your needs as well as improve your experience and satisfaction. Thank you for taking your time to complete it. We care about you.  Vista Sawatzky, PA-C  

## 2017-05-15 NOTE — Progress Notes (Signed)
BP 134/68   Pulse 66   Ht 5\' 5"  (1.651 m)   Wt 107 lb 6.4 oz (48.7 kg)   BMI 17.87 kg/m    Subjective:    Patient ID: Belinda Webb, female    DOB: [birth year], 83 y.o.   MRN: [id number]  HPI: Belinda Webb is a 83 y.o. female presenting on 05/15/2017 for Follow-up (4 week rck on legs and back. Patient states that things are not improving. )  This patient comes in for periodic recheck on medications and conditions including DDD, stenosis of spine, radiculopathy of legs, resolved vertigo. She reports that she has not had any more vertigo since she was seen here a month or 2 ago. She does not want to have any help in her home. The home health group [facility] came in for evaluation and occupational therapy and physical therapy. She did not want them to come anymore. She got some information and states that she is still doing the exercises regularly. She has put safety measures in her bathroom with handlebars etc. She does drive herself locally.   All medications are reviewed today. There are no reports of any problems with the medications. All of the medical conditions are reviewed and updated.  Lab work is reviewed and will be ordered as medically necessary. There are no new problems reported with today's visit.   Relevant past medical, surgical, family and social history reviewed and updated as indicated. Allergies and medications reviewed and updated.  Past Medical History:  Diagnosis Date  . Chronic cough   . HLD (hyperlipidemia)   . HTN (hypertension)   . Lung tumor 1984   LUL  . Stroke ([place])   . Vertigo     Past Surgical History:  Procedure Laterality Date  . APPENDECTOMY  1938  . BACK SURGERY    . BREAST BIOPSY     left  . CATARACT EXTRACTION, BILATERAL    . LUNG SURGERY  1984   LUL   . TONSILLECTOMY AND ADENOIDECTOMY    . TOTAL ABDOMINAL HYSTERECTOMY  1947    Review of Systems  Constitutional: Negative for activity change, fatigue and fever.  HENT: Negative.     Eyes: Negative.   Respiratory: Negative.  Negative for cough.   Cardiovascular: Negative.  Negative for chest pain.  Gastrointestinal: Negative.  Negative for abdominal pain.  Endocrine: Negative.   Genitourinary: Negative.  Negative for dysuria.  Musculoskeletal: Positive for arthralgias, back pain, gait problem and myalgias.  Skin: Negative.   Neurological: Positive for weakness. Negative for dizziness and light-headedness.    Allergies as of 05/15/2017      Reactions   Lipitor [atorvastatin Calcium] Other (See Comments)   Reaction unknown   Nitrofurantoin Monohyd Macro Other (See Comments)   Reaction unknown   Penicillins Other (See Comments)   Reaction unknown   Plavix [clopidogrel Bisulfate] Other (See Comments)   Reaction unknown   Statins    REACTION: effects muscles   Sulfa Antibiotics Other (See Comments)   Reaction unknown      Medication List       Accurate as of 05/15/17  1:50 PM. Always use your most recent med list.          amLODipine 5 MG tablet Commonly known as:  NORVASC TAKE 1 TABLET (5 MG TOTAL) BY MOUTH DAILY.   loratadine 10 MG tablet Commonly known as:  CLARITIN Take 10 mg by mouth daily. Reported on 01/14/2016   meclizine 25 MG tablet  Commonly known as:  ANTIVERT Take 1 tablet (25 mg total) by mouth 3 (three) times daily as needed.   traMADol 50 MG tablet Commonly known as:  ULTRAM Take by mouth 2 (two) times daily as needed.          Objective:    BP 134/68   Pulse 66   Ht 5\' 5"  (1.651 m)   Wt 107 lb 6.4 oz (48.7 kg)   BMI 17.87 kg/m   Allergies  Allergen Reactions  . Lipitor [Atorvastatin Calcium] Other (See Comments)    Reaction unknown  . Nitrofurantoin Monohyd Macro Other (See Comments)    Reaction unknown  . Penicillins Other (See Comments)    Reaction unknown  . Plavix [Clopidogrel Bisulfate] Other (See Comments)    Reaction unknown  . Statins     REACTION: effects muscles  . Sulfa Antibiotics Other (See  Comments)    Reaction unknown    Physical Exam  Constitutional: She is oriented to person, place, and time. She appears well-developed and well-nourished.  HENT:  Head: Normocephalic and atraumatic.  Eyes: Pupils are equal, round, and reactive to light. Conjunctivae and EOM are normal.  Cardiovascular: Normal rate, regular rhythm, normal heart sounds and intact distal pulses.   Pulmonary/Chest: Effort normal and breath sounds normal.  Abdominal: Soft. Bowel sounds are normal.  Neurological: She is alert and oriented to person, place, and time. She has normal reflexes. She exhibits normal muscle tone.  Skin: Skin is warm and dry. No rash noted.  Psychiatric: She has a normal mood and affect. Her behavior is normal. Judgment and thought content normal.        Assessment & Plan:   1. Lumbar pain with radiation down both legs  2. DDD (degenerative disc disease), lumbar   Current Outpatient Prescriptions:  .  amLODipine (NORVASC) 5 MG tablet, TAKE 1 TABLET (5 MG TOTAL) BY MOUTH DAILY., Disp: 30 tablet, Rfl: 1 .  loratadine (CLARITIN) 10 MG tablet, Take 10 mg by mouth daily. Reported on 01/14/2016, Disp: , Rfl:  .  meclizine (ANTIVERT) 25 MG tablet, Take 1 tablet (25 mg total) by mouth 3 (three) times daily as needed., Disp: 60 tablet, Rfl: 1 .  traMADol (ULTRAM) 50 MG tablet, Take by mouth 2 (two) times daily as needed., Disp: , Rfl:   Continue all other maintenance medications as listed above.  Follow up plan: Return in about 6 months (around 11/15/2017) for recheck.  Educational handout given for [place] PA-C [facility] [address]  [place], [place] [address] [phone]   05/15/2017, 1:50 PM

## 2017-05-16 ENCOUNTER — Ambulatory Visit (INDEPENDENT_AMBULATORY_CARE_PROVIDER_SITE_OTHER): Payer: Medicare Other | Admitting: Family Medicine

## 2017-05-16 DIAGNOSIS — M5489 Other dorsalgia: Secondary | ICD-10-CM

## 2017-05-16 DIAGNOSIS — R42 Dizziness and giddiness: Secondary | ICD-10-CM

## 2017-05-16 DIAGNOSIS — E785 Hyperlipidemia, unspecified: Secondary | ICD-10-CM | POA: Diagnosis not present

## 2017-05-16 DIAGNOSIS — I10 Essential (primary) hypertension: Secondary | ICD-10-CM | POA: Diagnosis not present

## 2017-05-16 DIAGNOSIS — Z9181 History of falling: Secondary | ICD-10-CM

## 2017-05-16 DIAGNOSIS — R262 Difficulty in walking, not elsewhere classified: Secondary | ICD-10-CM | POA: Diagnosis not present

## 2017-06-03 ENCOUNTER — Other Ambulatory Visit: Payer: Self-pay | Admitting: Family Medicine

## 2017-06-22 DIAGNOSIS — M47816 Spondylosis without myelopathy or radiculopathy, lumbar region: Secondary | ICD-10-CM | POA: Diagnosis not present

## 2017-06-22 DIAGNOSIS — M5441 Lumbago with sciatica, right side: Secondary | ICD-10-CM | POA: Diagnosis not present

## 2017-06-22 DIAGNOSIS — G894 Chronic pain syndrome: Secondary | ICD-10-CM | POA: Diagnosis not present

## 2017-06-22 DIAGNOSIS — G8929 Other chronic pain: Secondary | ICD-10-CM | POA: Diagnosis not present

## 2017-08-01 DIAGNOSIS — Z85828 Personal history of other malignant neoplasm of skin: Secondary | ICD-10-CM | POA: Diagnosis not present

## 2017-08-01 DIAGNOSIS — L821 Other seborrheic keratosis: Secondary | ICD-10-CM | POA: Diagnosis not present

## 2017-08-01 DIAGNOSIS — L570 Actinic keratosis: Secondary | ICD-10-CM | POA: Diagnosis not present

## 2017-08-03 DIAGNOSIS — G894 Chronic pain syndrome: Secondary | ICD-10-CM | POA: Diagnosis not present

## 2017-08-03 DIAGNOSIS — M47816 Spondylosis without myelopathy or radiculopathy, lumbar region: Secondary | ICD-10-CM | POA: Diagnosis not present

## 2017-08-05 ENCOUNTER — Other Ambulatory Visit: Payer: Self-pay | Admitting: Family Medicine

## 2017-09-06 ENCOUNTER — Ambulatory Visit (INDEPENDENT_AMBULATORY_CARE_PROVIDER_SITE_OTHER): Payer: Medicare Other | Admitting: Family Medicine

## 2017-09-06 ENCOUNTER — Encounter: Payer: Self-pay | Admitting: Family Medicine

## 2017-09-06 VITALS — BP 141/72 | HR 65 | Temp 96.8°F

## 2017-09-06 DIAGNOSIS — S81812A Laceration without foreign body, left lower leg, initial encounter: Secondary | ICD-10-CM

## 2017-09-06 DIAGNOSIS — Z23 Encounter for immunization: Secondary | ICD-10-CM | POA: Diagnosis not present

## 2017-09-06 NOTE — Progress Notes (Signed)
BP (!) 141/72 (BP Location: Left Arm, Patient Position: Sitting, Cuff Size: Normal)   Pulse 65   Temp (!) 96.8 F (36 C) (Oral)    Subjective:    Patient ID: Belinda Webb, female    DOB: November 30, 1924, 83 y.o.   MRN: [id number]  HPI: Belinda Webb is a 83 y.o. female presenting on 09/06/2017 for leg wound (L lower leg)   HPI Wound on leg Patient comes in today because she sustained a wound earlier today on her left lower leg on her shin.  She was walking and lost her balance and scraped her leg on a small wooden thing.  She denies falling and hitting anywhere else and denies any pain any where else.  She says it is painful and was bleeding a lot and they could not stop the bleeding so that is why she came in today.  She denies any fevers or chills or purulence.  Relevant past medical, surgical, family and social history reviewed and updated as indicated. Interim medical history since our last visit reviewed. Allergies and medications reviewed and updated.  Review of Systems  Constitutional: Negative for chills and fever.  Eyes: Negative for visual disturbance.  Respiratory: Negative for chest tightness and shortness of breath.   Cardiovascular: Negative for chest pain and leg swelling.  Musculoskeletal: Negative for back pain and gait problem.  Skin: Positive for wound. Negative for rash.  Neurological: Negative for light-headedness and headaches.  Psychiatric/Behavioral: Negative for agitation and behavioral problems.  All other systems reviewed and are negative.   Per HPI unless specifically indicated above        Objective:    BP (!) 141/72 (BP Location: Left Arm, Patient Position: Sitting, Cuff Size: Normal)   Pulse 65   Temp (!) 96.8 F (36 C) (Oral)   Wt Readings from Last 3 Encounters:  05/15/17 107 lb 6.4 oz (48.7 kg)  04/10/17 109 lb 12.8 oz (49.8 kg)  03/30/17 111 lb (50.3 kg)    Physical Exam  Constitutional: She is oriented to person, place, and  time. She appears well-developed and well-nourished. No distress.  Eyes: Conjunctivae are normal.  Musculoskeletal: Normal range of motion. She exhibits edema (Trace edema).  Neurological: She is alert and oriented to person, place, and time. Coordination normal.  Skin: Skin is warm and dry. Laceration noted. She is not diaphoretic.     Psychiatric: She has a normal mood and affect. Her behavior is normal.  Nursing note and vitals reviewed.   Laceration repair: Wound was irrigated with normal saline at pressure. 2% lidocaine with epinephrine was used for local anesthesia, 60mL. 3-0 Monocryl was used to repair the wound but because of patient's thin skin and it tore right through we had to remove suture and placed Steri-Strips instead.  Wound was approximated well and topical antibiotic was used and then it was covered by pressure dressing. Procedure was tolerated well   Remove pressure dressing after 24 hours and keep dry for a week, let Steri-Strips fall off on their own.  Return for any signs of infection.    Assessment & Plan:   Problem List Items Addressed This Visit    None    Visit Diagnoses    Skin tear of left lower leg without complication, initial encounter    -  Primary       Follow up plan: Return if symptoms worsen or fail to improve.  Counseling provided for all of the vaccine components No orders  of the defined types were placed in this encounter.   Caryl Pina, MD [facility] 09/06/2017, 1:05 PM

## 2017-09-06 NOTE — Patient Instructions (Signed)
Remove pressure dressing after 24 hours and keep dry for a week, let Steri-Strips fall off on their own.  Return for any signs of infection.

## 2017-09-08 ENCOUNTER — Ambulatory Visit (INDEPENDENT_AMBULATORY_CARE_PROVIDER_SITE_OTHER): Payer: Medicare Other | Admitting: Pediatrics

## 2017-09-08 ENCOUNTER — Encounter: Payer: Self-pay | Admitting: Pediatrics

## 2017-09-08 VITALS — BP 138/77 | HR 76 | Temp 97.0°F | Ht 65.0 in | Wt 110.6 lb

## 2017-09-08 DIAGNOSIS — S81812A Laceration without foreign body, left lower leg, initial encounter: Secondary | ICD-10-CM

## 2017-09-08 NOTE — Progress Notes (Signed)
  Subjective:   Patient ID: Belinda Webb, female    DOB: Aug 23, 1925, 83 y.o.   MRN: [id number] CC: Follow-up (fall, left leg)  HPI: Belinda Webb is a 83 y.o. female presenting for Follow-up (fall, left leg)  Fell two days ago  Seen in clinic, steristrips and pressure dressing applied to wound Pt hasnt yet removed pressure dressing  Here today for evaluation of injury  Says leg is less sore, able to walk on it No discharge/bleeding around injury  No fevers, appetite is fine  Relevant past medical, surgical, family and social history reviewed. Allergies and medications reviewed and updated. Social History   Tobacco Use  Smoking Status Former Smoker  . Packs/day: 0.50  . Years: 40.00  . Pack years: 20.00  . Types: Cigarettes  . Last attempt to quit: 10/17/1982  . Years since quitting: 34.9  Smokeless Tobacco Never Used   ROS: Per HPI   Objective:    BP 138/77   Pulse 76   Temp (!) 97 F (36.1 C) (Oral)   Ht 5\' 5"  (1.651 m)   Wt 110 lb 9.6 oz (50.2 kg)   BMI 18.40 kg/m   Wt Readings from Last 3 Encounters:  09/08/17 110 lb 9.6 oz (50.2 kg)  05/15/17 107 lb 6.4 oz (48.7 kg)  04/10/17 109 lb 12.8 oz (49.8 kg)    Gen: NAD, alert, cooperative with exam, NCAT EYES: EOMI, no conjunctival injection, or no icterus CV: NRRR, normal S1/S2 Resp: CTABL, no wheezes, normal WOB Ext: No edema, warm Neuro: Alert and oriented, strength equal b/l UE and LE, coordination grossly normal Skin: L lower leg with curved skin tear, skin re-approximated with steristrips Medial edge with small amount of bleeding when dressing removed Surrounded by slight bruising No induration, no discharge from wound  Assessment & Plan:  Belinda Webb was seen today for follow-up.  Diagnoses and all orders for this visit:  Skin tear of left lower leg without complication, initial encounter Wound check today, healing, keep dry, keep steristrips in place Return precautions discussed  Follow up  plan: Return in about 1 week (around 09/15/2017). [name], MD [place]

## 2017-09-08 NOTE — Patient Instructions (Signed)
Keep wound dry for the next week, don't get wet in bath/shower Leave steristrips alone, OK if they fall off on their own  Wrap leg loosely with gauze as needed to keep blood off of clothes Any discharge let me know

## 2017-09-15 ENCOUNTER — Encounter: Payer: Self-pay | Admitting: Pediatrics

## 2017-09-15 ENCOUNTER — Ambulatory Visit (INDEPENDENT_AMBULATORY_CARE_PROVIDER_SITE_OTHER): Payer: Medicare Other | Admitting: Pediatrics

## 2017-09-15 VITALS — BP 133/71 | HR 56 | Temp 96.9°F | Ht 65.0 in | Wt 110.0 lb

## 2017-09-15 DIAGNOSIS — S81812D Laceration without foreign body, left lower leg, subsequent encounter: Secondary | ICD-10-CM | POA: Diagnosis not present

## 2017-09-15 DIAGNOSIS — S81812A Laceration without foreign body, left lower leg, initial encounter: Secondary | ICD-10-CM

## 2017-09-15 NOTE — Progress Notes (Signed)
  Subjective:   Patient ID: Belinda Webb, female    DOB: Oct 01, 1925, 83 y.o.   MRN: [id number] CC: Follow-up (Leg wound)  HPI: Belinda Webb is a 83 y.o. female presenting for Follow-up (Leg wound) Here today with her son from [place] Leg wound 2 weeks ago, closed with Steri-Strips Here for recheck Pain continues to improve, bruising resolved Uses cane at baseline Minimal discharge from wound Has been keeping it dry  Relevant past medical, surgical, family and social history reviewed. Allergies and medications reviewed and updated. Social History   Tobacco Use  Smoking Status Former Smoker  . Packs/day: 0.50  . Years: 40.00  . Pack years: 20.00  . Types: Cigarettes  . Last attempt to quit: 10/17/1982  . Years since quitting: 34.9  Smokeless Tobacco Never Used   ROS: Per HPI   Objective:    BP 133/71   Pulse (!) 56   Temp (!) 96.9 F (36.1 C) (Oral)   Ht 5\' 5"  (1.651 m)   Wt 110 lb (49.9 kg)   BMI 18.30 kg/m   Wt Readings from Last 3 Encounters:  09/15/17 110 lb (49.9 kg)  09/08/17 110 lb 9.6 oz (50.2 kg)  05/15/17 107 lb 6.4 oz (48.7 kg)    Gen: NAD, alert, cooperative with exam, NCAT EYES: EOMI, no conjunctival injection, or no icterus CV: NRRR, normal S1/S2 Resp: CTABL, no wheezes, normal WOB Ext: no pitting edema, warm Neuro: Alert and oriented MSK:  Skin: Left anterior shin with healing approximately 10 cm curved laceration covered with Steri-Strips.  Minimal brown discharge present on bandage No surrounding bruising Minimal induration present for proximal 1 cm without erythema below skin tear  Assessment & Plan:  [name] was seen today for follow-up skin tear  Diagnoses and all orders for this visit:  Skin tear of left lower leg without complication Improving, continue routine care Okay to use triple antibiotic ointment with any discharge Any worsening in symptoms, discharge, pain needs to be seen  Follow up plan: 2 weeks [name],  MD [place]

## 2017-09-29 ENCOUNTER — Ambulatory Visit (INDEPENDENT_AMBULATORY_CARE_PROVIDER_SITE_OTHER): Payer: Medicare Other

## 2017-09-29 ENCOUNTER — Ambulatory Visit (INDEPENDENT_AMBULATORY_CARE_PROVIDER_SITE_OTHER): Payer: Medicare Other | Admitting: Pediatrics

## 2017-09-29 ENCOUNTER — Encounter: Payer: Self-pay | Admitting: Pediatrics

## 2017-09-29 VITALS — BP 127/73 | HR 60 | Temp 97.1°F | Ht 65.0 in | Wt 112.4 lb

## 2017-09-29 DIAGNOSIS — L03116 Cellulitis of left lower limb: Secondary | ICD-10-CM | POA: Diagnosis not present

## 2017-09-29 DIAGNOSIS — S81812S Laceration without foreign body, left lower leg, sequela: Secondary | ICD-10-CM

## 2017-09-29 DIAGNOSIS — M7989 Other specified soft tissue disorders: Secondary | ICD-10-CM | POA: Diagnosis not present

## 2017-09-29 DIAGNOSIS — M25572 Pain in left ankle and joints of left foot: Secondary | ICD-10-CM | POA: Diagnosis not present

## 2017-09-29 MED ORDER — CEPHALEXIN 500 MG PO CAPS: 500.0000 mg | ORAL_CAPSULE | Freq: Four times a day (QID) | ORAL | 0 refills | Status: DC

## 2017-09-29 NOTE — Progress Notes (Signed)
  Subjective:   Patient ID: BRITINEY BLAHNIK, female    DOB: Oct 25, 1924, 83 y.o.   MRN: [id number] CC: Follow-up (2 week, skin tear)  HPI: KAYLAH CHIASSON is a 83 y.o. female presenting for Follow-up (2 week, skin tear)  Here today with her son for follow-up of a large skin tear Steri-Strips still in place Having more pain with weightbearing on the left leg Needing cane at time to help with mobility Patient wants to know if it is possible that she broke it  no discharge from the wound  No fevers, normal appetite  Relevant past medical, surgical, family and social history reviewed. Allergies and medications reviewed and updated. Social History   Tobacco Use  Smoking Status Former Smoker  . Packs/day: 0.50  . Years: 40.00  . Pack years: 20.00  . Types: Cigarettes  . Last attempt to quit: 10/17/1982  . Years since quitting: 34.9  Smokeless Tobacco Never Used   ROS: Per HPI   Objective:    BP 127/73   Pulse 60   Temp (!) 97.1 F (36.2 C) (Oral)   Ht 5\' 5"  (1.651 m)   Wt 112 lb 6.4 oz (51 kg)   BMI 18.70 kg/m   Wt Readings from Last 3 Encounters:  09/29/17 112 lb 6.4 oz (51 kg)  09/15/17 110 lb (49.9 kg)  09/08/17 110 lb 9.6 oz (50.2 kg)    Gen: NAD, alert, cooperative with exam, NCAT EYES: EOMI, no conjunctival injection, or no icterus CV: NRRR, normal S1/S2 Resp: CTABL, no wheezes, normal WOB Ext: Trace edema bilateral lower legs, warm Neuro: Alert and oriented Skin: Left distal shin with approximately 10 cm curved laceration, intact, no discharge 5-10 mm along distal edge of wound dusky gray, healthy tissue along the proximal border Tenderness to palpation along medial side of lower leg, slightly pink  Assessment & Plan:  Maebelle was seen today for follow-up laceration and tenderness  Diagnoses and all orders for this visit:  Cellulitis of left lower extremity Given new tenderness, slight erythema, will treat for cellulitis Discussed with son and husband likely  to lose some of the skin along the bottom portion of the laceration due to poor blood supply after the shearing skin tear -     cephALEXin (KEFLEX) 500 MG capsule; Take 1 capsule (500 mg total) by mouth 4 (four) times daily.  Acute left ankle pain Patient very worried about it being a fracture despite reassurances, x-ray without acute fracture -     DG Ankle Complete Left; Future  Laceration Steri-Strips removed were able, very fragile skin below laceration Wound care discussed, soap and water antibiotic ointment as needed  Follow up plan: 1 week [name], MD [place]

## 2017-10-01 ENCOUNTER — Encounter: Payer: Self-pay | Admitting: Pediatrics

## 2017-10-05 ENCOUNTER — Ambulatory Visit (INDEPENDENT_AMBULATORY_CARE_PROVIDER_SITE_OTHER): Payer: Medicare Other | Admitting: Pediatrics

## 2017-10-05 ENCOUNTER — Encounter: Payer: Self-pay | Admitting: Pediatrics

## 2017-10-05 VITALS — BP 135/76 | HR 60 | Temp 97.0°F | Ht 65.0 in | Wt 112.6 lb

## 2017-10-05 DIAGNOSIS — L03116 Cellulitis of left lower limb: Secondary | ICD-10-CM | POA: Diagnosis not present

## 2017-10-05 DIAGNOSIS — S81812D Laceration without foreign body, left lower leg, subsequent encounter: Secondary | ICD-10-CM

## 2017-10-05 MED ORDER — CEPHALEXIN 500 MG PO CAPS: 500.0000 mg | ORAL_CAPSULE | Freq: Three times a day (TID) | ORAL | 0 refills | Status: DC

## 2017-10-05 NOTE — Progress Notes (Signed)
  Subjective:   Patient ID: Belinda Webb, female    DOB: 08/16/1925, 83 y.o.   MRN: [id number] CC: Follow-up (Wound check)  HPI: Belinda Webb is a 83 y.o. female presenting for Follow-up (Wound check)  Here today for follow-up of left shin wound/skin tear that occurred about a month ago Was treated for cellulitis last week given increasing tenderness, redness around wound At that time the skin flap had a dusky color which was new  Patient says the pain is much improved She is able to go about her normal activities, she is self-conscious about the ligament itself it has scabbed over and has a dark color to it Minimal tenderness, minimal swelling in her lower legs She pulled it a small skin tear on her left lower leg to the side of the wound thinking it was paper but it turned out to be skin she has a small superficial wound there now this occurred this morning She is kept a Band-Aid on it  Denies fevers, has had a normal appetite Here today with her son  no other new complaints   Relevant past medical, surgical, family and social history reviewed. Allergies and medications reviewed and updated. Social History   Tobacco Use  Smoking Status Former Smoker  . Packs/day: 0.50  . Years: 40.00  . Pack years: 20.00  . Types: Cigarettes  . Last attempt to quit: 10/17/1982  . Years since quitting: 34.9  Smokeless Tobacco Never Used   ROS: Per HPI   Objective:    BP 135/76   Pulse 60   Temp (!) 97 F (36.1 C) (Oral)   Ht 5\' 5"  (1.651 m)   Wt 112 lb 9.6 oz (51.1 kg)   BMI 18.74 kg/m   Wt Readings from Last 3 Encounters:  10/05/17 112 lb 9.6 oz (51.1 kg)  09/29/17 112 lb 6.4 oz (51 kg)  09/15/17 110 lb (49.9 kg)    Gen: NAD, alert, cooperative with exam, NCAT EYES: EOMI, no conjunctival injection, or no icterus CV: NRRR, normal S1/S2 Resp: CTABL, no wheezes, normal WOB Abd: +BS, soft, NTND.  Ext: Trace pitting edema bilateral lower legs edema, warm Neuro: Alert and  oriented, strength equal b/l UE and LE, coordination grossly normal Skin: Left shin with approximately 7 cm x 2 cm dark scab 3-9mm redness around the scab area, minimal tenderness around the area Several millimeter superficial tear with minimal clear drainage from it medial left lower leg  Assessment & Plan:  Sharni was seen today for follow-up skin lesion  Diagnoses and all orders for this visit:  Skin tear of left lower leg without complication Improving, has lost skin flap Expect will continue to heal at this point, return precautions discussed  Cellulitis of left lower extremity Improving, continue for additional 5 days antibiotics given ongoing redness -     cephALEXin (KEFLEX) 500 MG capsule; Take 1 capsule (500 mg total) by mouth 3 (three) times daily.   Follow up plan: Return in about 3 months (around 01/03/2018). [name], MD [place]

## 2017-10-23 DIAGNOSIS — M545 Low back pain: Secondary | ICD-10-CM | POA: Diagnosis not present

## 2017-10-23 DIAGNOSIS — G894 Chronic pain syndrome: Secondary | ICD-10-CM | POA: Diagnosis not present

## 2017-10-23 DIAGNOSIS — Z79899 Other long term (current) drug therapy: Secondary | ICD-10-CM | POA: Diagnosis not present

## 2017-10-31 ENCOUNTER — Other Ambulatory Visit: Payer: Self-pay | Admitting: Physician Assistant

## 2017-11-08 ENCOUNTER — Other Ambulatory Visit: Payer: Self-pay | Admitting: Family Medicine

## 2017-11-09 ENCOUNTER — Telehealth: Payer: Self-pay | Admitting: Physician Assistant

## 2017-11-09 MED ORDER — TRIAMTERENE-HCTZ 37.5-25 MG PO TABS: 1.0000 | ORAL_TABLET | Freq: Every day | ORAL | 3 refills | Status: DC

## 2017-11-09 NOTE — Telephone Encounter (Signed)
Pt has not been taking the Triamterene-HCTZ as per 03/30/17 OV  Needs RF sent to [place] have been swelling, pt took 1/2 tab and this has helped

## 2017-11-09 NOTE — Telephone Encounter (Signed)
What is the name of the medication? Triamterene HCTZ Patient has appt 1-30 with Glenard Haring. It's not on her med list but her feet is swelling.  Have you contacted your pharmacy to request a refill? NO  Which pharmacy would you like this sent to? [facility] [place]   Patient notified that their request is being sent to the clinical staff for review and that they should receive a call once it is complete. If they do not receive a call within 24 hours they can check with their pharmacy or our office.

## 2017-11-10 ENCOUNTER — Telehealth: Payer: Self-pay | Admitting: Physician Assistant

## 2017-11-10 NOTE — Telephone Encounter (Signed)
Son aware mom has refills of her [facility] hctz at [facility].

## 2017-11-10 NOTE — Telephone Encounter (Signed)
Patient aware.

## 2017-11-15 ENCOUNTER — Encounter: Payer: Self-pay | Admitting: Physician Assistant

## 2017-11-15 ENCOUNTER — Ambulatory Visit (INDEPENDENT_AMBULATORY_CARE_PROVIDER_SITE_OTHER): Payer: Medicare Other | Admitting: Physician Assistant

## 2017-11-15 VITALS — BP 128/69 | HR 77 | Temp 96.7°F | Ht 65.0 in | Wt 111.8 lb

## 2017-11-15 DIAGNOSIS — M545 Low back pain: Secondary | ICD-10-CM

## 2017-11-15 DIAGNOSIS — M79604 Pain in right leg: Secondary | ICD-10-CM

## 2017-11-15 DIAGNOSIS — I10 Essential (primary) hypertension: Secondary | ICD-10-CM

## 2017-11-15 DIAGNOSIS — M5136 Other intervertebral disc degeneration, lumbar region: Secondary | ICD-10-CM

## 2017-11-15 DIAGNOSIS — R29898 Other symptoms and signs involving the musculoskeletal system: Secondary | ICD-10-CM | POA: Diagnosis not present

## 2017-11-15 DIAGNOSIS — M79605 Pain in left leg: Secondary | ICD-10-CM

## 2017-11-15 NOTE — Progress Notes (Signed)
BP 128/69   Pulse 77   Temp (!) 96.7 F (35.9 C) (Oral)   Ht 5\' 5"  (1.651 m)   Wt 111 lb 12.8 oz (50.7 kg)   BMI 18.60 kg/m    Subjective:    Patient ID: Belinda Webb, female    DOB: 1924-12-25, 84 y.o.   MRN: [id number]  HPI: Belinda Webb is a 84 y.o. female presenting on 11/15/2017 for Follow-up (6 month ) and Check skin tear (left leg )  Patient comes in for 66-month recheck on her conditions and medication.  She does have hypertension, degenerative disc disease, weakness in her lower legs and radiation down her legs.  She does go and see Dr. Dossie Der quarterly still.  They just try to give her comfort measures.  She does not qualify for surgery.  She sees her oncologist works here for her chest follow-up.  She did have a skin tear couple months ago has had a long time healing but is doing much better.  Relevant past medical, surgical, family and social history reviewed and updated as indicated. Allergies and medications reviewed and updated.  Past Medical History:  Diagnosis Date  . Chronic cough   . HLD (hyperlipidemia)   . HTN (hypertension)   . Lung tumor 1984   LUL  . Stroke ([place])   . Vertigo     Past Surgical History:  Procedure Laterality Date  . APPENDECTOMY  1938  . BACK SURGERY    . BREAST BIOPSY     left  . CATARACT EXTRACTION, BILATERAL    . LUNG SURGERY  1984   LUL   . TONSILLECTOMY AND ADENOIDECTOMY    . TOTAL ABDOMINAL HYSTERECTOMY  1947    Review of Systems  Constitutional: Negative.  Negative for activity change, fatigue and fever.  HENT: Negative.   Eyes: Negative.   Respiratory: Negative.  Negative for cough.   Cardiovascular: Negative.  Negative for chest pain.  Gastrointestinal: Negative.  Negative for abdominal pain.  Endocrine: Negative.   Genitourinary: Negative.  Negative for dysuria.  Musculoskeletal: Positive for arthralgias, back pain and gait problem.  Skin: Negative.     Allergies as of 11/15/2017      Reactions   Lipitor  [atorvastatin Calcium] Other (See Comments)   Reaction unknown   Nitrofurantoin Monohyd Macro Other (See Comments)   Reaction unknown   Penicillins Other (See Comments)   Reaction unknown   Plavix [clopidogrel Bisulfate] Other (See Comments)   Reaction unknown   Statins    REACTION: effects muscles   Sulfa Antibiotics Other (See Comments)   Reaction unknown      Medication List        Accurate as of 11/15/17  2:34 PM. Always use your most recent med list.          amLODipine 5 MG tablet Commonly known as:  NORVASC TAKE 1 TABLET BY MOUTH EVERY DAY   loratadine 10 MG tablet Commonly known as:  CLARITIN Take 10 mg by mouth daily. Reported on 01/14/2016   meclizine 25 MG tablet Commonly known as:  ANTIVERT Take 1 tablet (25 mg total) by mouth 3 (three) times daily as needed.   traMADol 50 MG tablet Commonly known as:  ULTRAM Take by mouth 2 (two) times daily as needed.   triamterene-hydrochlorothiazide 37.5-25 MG tablet Commonly known as:  MAXZIDE-25 Take 1 tablet by mouth daily.          Objective:    BP 128/69  Pulse 77   Temp (!) 96.7 F (35.9 C) (Oral)   Ht 5\' 5"  (1.651 m)   Wt 111 lb 12.8 oz (50.7 kg)   BMI 18.60 kg/m   Allergies  Allergen Reactions  . Lipitor [Atorvastatin Calcium] Other (See Comments)    Reaction unknown  . Nitrofurantoin Monohyd Macro Other (See Comments)    Reaction unknown  . Penicillins Other (See Comments)    Reaction unknown  . Plavix [Clopidogrel Bisulfate] Other (See Comments)    Reaction unknown  . Statins     REACTION: effects muscles  . Sulfa Antibiotics Other (See Comments)    Reaction unknown    Physical Exam  Constitutional: She is oriented to person, place, and time. She appears well-developed and well-nourished.  HENT:  Head: Normocephalic and atraumatic.  Eyes: Conjunctivae and EOM are normal. Pupils are equal, round, and reactive to light.  Cardiovascular: Normal rate, regular rhythm, normal heart sounds  and intact distal pulses.  Pulmonary/Chest: Effort normal and breath sounds normal.  Abdominal: Soft. Bowel sounds are normal.  Neurological: She is alert and oriented to person, place, and time. She has normal reflexes.  Skin: Skin is warm and dry. No rash noted.  Psychiatric: She has a normal mood and affect. Her behavior is normal. Judgment and thought content normal.        Assessment & Plan:   1. Essential hypertension  2. Weakness of both lower extremities  3. DDD (degenerative disc disease), lumbar  4. Lumbar pain with radiation down both legs    Current Outpatient Medications:  .  amLODipine (NORVASC) 5 MG tablet, TAKE 1 TABLET BY MOUTH EVERY DAY, Disp: 90 tablet, Rfl: 1 .  loratadine (CLARITIN) 10 MG tablet, Take 10 mg by mouth daily. Reported on 01/14/2016, Disp: , Rfl:  .  meclizine (ANTIVERT) 25 MG tablet, Take 1 tablet (25 mg total) by mouth 3 (three) times daily as needed., Disp: 60 tablet, Rfl: 1 .  traMADol (ULTRAM) 50 MG tablet, Take by mouth 2 (two) times daily as needed., Disp: , Rfl:  .  triamterene-hydrochlorothiazide (MAXZIDE-25) 37.5-25 MG tablet, Take 1 tablet by mouth daily., Disp: 90 tablet, Rfl: 3 Continue all other maintenance medications as listed above.  Follow up plan: Return in about 6 months (around 05/15/2018) for recheck.  Educational handout given for [place] PA-C [place] [address]  [place], [place] [address] [phone]   11/15/2017, 2:34 PM

## 2017-11-15 NOTE — Patient Instructions (Signed)
In a few days you may receive a survey in the mail or online from [employer] regarding your visit with us today. Please take a moment to fill this out. Your feedback is very important to our whole office. It can help us better understand your needs as well as improve your experience and satisfaction. Thank you for taking your time to complete it. We care about you.  Abram Sax, PA-C  

## 2018-01-03 DIAGNOSIS — H524 Presbyopia: Secondary | ICD-10-CM | POA: Diagnosis not present

## 2018-01-30 ENCOUNTER — Emergency Department (HOSPITAL_COMMUNITY): Payer: Medicare Other

## 2018-01-30 ENCOUNTER — Encounter (HOSPITAL_COMMUNITY): Payer: Self-pay

## 2018-01-30 ENCOUNTER — Other Ambulatory Visit: Payer: Self-pay

## 2018-01-30 ENCOUNTER — Emergency Department (HOSPITAL_COMMUNITY)
Admission: EM | Admit: 2018-01-30 | Discharge: 2018-01-30 | Disposition: A | Discharge status: Home / Self Care | Payer: Medicare Other | Attending: Emergency Medicine | Admitting: Emergency Medicine

## 2018-01-30 DIAGNOSIS — R0789 Other chest pain: Secondary | ICD-10-CM | POA: Insufficient documentation

## 2018-01-30 DIAGNOSIS — S3992XA Unspecified injury of lower back, initial encounter: Secondary | ICD-10-CM | POA: Diagnosis not present

## 2018-01-30 DIAGNOSIS — Y999 Unspecified external cause status: Secondary | ICD-10-CM | POA: Diagnosis not present

## 2018-01-30 DIAGNOSIS — M546 Pain in thoracic spine: Secondary | ICD-10-CM

## 2018-01-30 DIAGNOSIS — M545 Low back pain: Secondary | ICD-10-CM | POA: Diagnosis not present

## 2018-01-30 DIAGNOSIS — I10 Essential (primary) hypertension: Secondary | ICD-10-CM | POA: Insufficient documentation

## 2018-01-30 DIAGNOSIS — Z79899 Other long term (current) drug therapy: Secondary | ICD-10-CM | POA: Insufficient documentation

## 2018-01-30 DIAGNOSIS — S299XXA Unspecified injury of thorax, initial encounter: Secondary | ICD-10-CM | POA: Diagnosis not present

## 2018-01-30 DIAGNOSIS — Y9301 Activity, walking, marching and hiking: Secondary | ICD-10-CM | POA: Insufficient documentation

## 2018-01-30 DIAGNOSIS — Z043 Encounter for examination and observation following other accident: Secondary | ICD-10-CM | POA: Diagnosis present

## 2018-01-30 DIAGNOSIS — W19XXXA Unspecified fall, initial encounter: Secondary | ICD-10-CM

## 2018-01-30 DIAGNOSIS — T148XXA Other injury of unspecified body region, initial encounter: Secondary | ICD-10-CM | POA: Diagnosis not present

## 2018-01-30 DIAGNOSIS — W1830XA Fall on same level, unspecified, initial encounter: Secondary | ICD-10-CM | POA: Insufficient documentation

## 2018-01-30 DIAGNOSIS — Y92018 Other place in single-family (private) house as the place of occurrence of the external cause: Secondary | ICD-10-CM | POA: Insufficient documentation

## 2018-01-30 DIAGNOSIS — Z87891 Personal history of nicotine dependence: Secondary | ICD-10-CM | POA: Insufficient documentation

## 2018-01-30 DIAGNOSIS — M5136 Other intervertebral disc degeneration, lumbar region: Secondary | ICD-10-CM | POA: Diagnosis not present

## 2018-01-30 DIAGNOSIS — R100 Acute abdomen: Secondary | ICD-10-CM | POA: Diagnosis not present

## 2018-01-30 LAB — BASIC METABOLIC PANEL
Anion gap: 13 (ref 5–15)
BUN: 21 mg/dL — AB (ref 6–20)
CALCIUM: 9.1 mg/dL (ref 8.9–10.3)
CHLORIDE: 106 mmol/L (ref 101–111)
CO2: 21 mmol/L — AB (ref 22–32)
CREATININE: 1.04 mg/dL — AB (ref 0.44–1.00)
GFR calc Af Amer: 53 mL/min — ABNORMAL LOW (ref >60)
GFR calc non Af Amer: 46 mL/min — ABNORMAL LOW (ref >60)
GLUCOSE: 106 mg/dL — AB (ref 65–99)
Potassium: 4.2 mmol/L (ref 3.5–5.1)
Sodium: 140 mmol/L (ref 135–145)

## 2018-01-30 LAB — CBC
HEMATOCRIT: 44.1 % (ref 36.0–46.0)
HEMOGLOBIN: 14.6 g/dL (ref 12.0–15.0)
MCH: 33 pg (ref 26.0–34.0)
MCHC: 33.1 g/dL (ref 30.0–36.0)
MCV: 99.5 fL (ref 78.0–100.0)
Platelets: 218 10*3/uL (ref 150–400)
RBC: 4.43 MIL/uL (ref 3.87–5.11)
RDW: 13 % (ref 11.5–15.5)
WBC: 11.8 10*3/uL — ABNORMAL HIGH (ref 4.0–10.5)

## 2018-01-30 MED ORDER — CYCLOBENZAPRINE HCL 10 MG PO TABS
5.0000 mg | ORAL_TABLET | Freq: Once | ORAL | Status: AC
  Administered 2018-01-30: 5 mg via ORAL
  Filled 2018-01-30: qty 1

## 2018-01-30 MED ORDER — MORPHINE SULFATE (PF) 4 MG/ML IV SOLN
4.0000 mg | Freq: Once | INTRAVENOUS | Status: AC
  Administered 2018-01-30: 4 mg via INTRAVENOUS
  Filled 2018-01-30: qty 1

## 2018-01-30 MED ORDER — TRAMADOL HCL 50 MG PO TABS
50.0000 mg | ORAL_TABLET | Freq: Once | ORAL | Status: AC
Start: 2018-01-30 — End: 2018-01-30
  Administered 2018-01-30: 50 mg via ORAL
  Filled 2018-01-30: qty 1

## 2018-01-30 MED ORDER — MORPHINE SULFATE (PF) 4 MG/ML IV SOLN: 4.0000 mg | Freq: Once | INTRAVENOUS | Status: DC

## 2018-01-30 NOTE — ED Notes (Signed)
Home health will be bringing wheelchair down, per Levada Dy case Freight forwarder

## 2018-01-30 NOTE — ED Notes (Signed)
Bed: WA02 Expected date:  Expected time:  Means of arrival:  Comments: EMS-fall 

## 2018-01-30 NOTE — ED Triage Notes (Signed)
Patient presented to ed with c/o fall at home. Pt slipped and  fell outside at 5:30 am this morning. Pt state she crawled back in house. No LOC, pt c/o lower back pain and LLQ pain. Hx of back surgery 65yrs ago,and vertigo.

## 2018-01-30 NOTE — ED Notes (Signed)
Pt placed in blue scrub bottoms and socks for transport home as her home items were wet.

## 2018-01-30 NOTE — ED Notes (Signed)
Pt ambulated with walker independently outside of room.

## 2018-01-30 NOTE — ED Provider Notes (Signed)
    Durable Medical Equipment  (From admission, onward)        Start     Ordered   01/30/18 1507  For home use only DME standard manual wheelchair with seat cushion  Once    Comments:  Patient suffers from pain and instability which impairs their ability to perform daily activities like bathing, dressing, feeding and toileting in the home.  A walker will not resolve  issue with performing activities of daily living. A wheelchair will allow patient to safely perform daily activities. Patient can safely propel the wheelchair in the home or has a caregiver who can provide assistance.  Accessories: elevating leg rests (ELRs), wheel locks, extensions and anti-tippers.   01/30/18 Helmetta Carnetta Losada, MD 01/30/18 [phone]

## 2018-01-30 NOTE — ED Notes (Signed)
Attempted to ambulate patient with assistance from the family, lifted patient's head and she yelped out in pain and stated she wanted to stop

## 2018-01-30 NOTE — Care Management Note (Signed)
Case Management Note  Patient Details  Name: Belinda Webb MRN: [id number] Date of Birth: 08/25/1925  CM consulted for [facility].  Spoke with pt and sons at bedside.  They report pt lives at home alone and has used [facility] in the past.  They have most DME but do not have a wheelchair.  CM and sons discussed PCS and instructed them on how to find agencies.  Updated Dr. Venora Maples.  Contacted Santiago Glad with [facility] who accepted pt for services and will bring a wheelchair down to pt before she is D/C.  Updated primary RN.  No further CM needs noted at this time.  Expected Discharge Date:   01/31/2108               Expected Discharge Plan:  [place]  Discharge planning Services  CM Consult  Post Acute Care Choice:  Home Health, Durable Medical Equipment Choice offered to:  Patient, Adult Children  DME Arranged:  Wheelchair manual DME Agency:  [place]:  RN, PT, OT, Nurse's Aide, Social Work [employer] Agency:  [employer]  Status of Service:  Completed, signed off  Nichalos Brenton, Benjaman Lobe, RN 01/30/2018, 3:10 PM

## 2018-01-30 NOTE — ED Provider Notes (Signed)
[facility] DEPT Provider Note   CSN: [id number] Arrival date & time: 01/30/18  9983     History   Chief Complaint No chief complaint on file.   HPI Belinda Webb is a 84 y.o. female.  HPI Patient is a 83 year old female who ambulates with a cane and a walker primarily in the house who went out on the front porch today to pick up the paper and fell onto her right side.  She presents with right upper back and thoracic rib pain as well as low back pain on the right side.  She denies pain in her shoulders, elbows, wrist bilaterally.  She denies pain in her hips, knees, ankles.  No head injury.  No loss consciousness.  No neck pain or headache at this time.  Patient has been in her normal state of health otherwise.  She was able to crawl back into the house and contact family members.  She lives independent.  She is here with her son.  Pain is mild to moderate in severity and worse with palpation of her right ribs and right thoracic and lumbar spine.   Past Medical History:  Diagnosis Date  . Chronic cough   . HLD (hyperlipidemia)   . HTN (hypertension)   . Lung tumor 1984   LUL  . Stroke ([place])   . Vertigo     Patient Active Problem List   Diagnosis Date Noted  . DDD (degenerative disc disease), lumbar 05/15/2017  . Lumbar pain with radiation down both legs 04/11/2017  . Weakness of both lower extremities 04/11/2017  . History of fall 04/11/2017  . Risk for falls 04/11/2017  . BMI less than 19,adult 05/06/2015  . Cancer of chest (wall) ([place]) 01/21/2015  . Hypertension 04/16/2012  . Fall 04/15/2012  . Hoarseness 11/22/2011  . Esophageal dysmotility 11/22/2011  . Chronic cough   . Stroke [facility])     Past Surgical History:  Procedure Laterality Date  . APPENDECTOMY  1938  . BACK SURGERY    . BREAST BIOPSY     left  . CATARACT EXTRACTION, BILATERAL    . LUNG SURGERY  1984   LUL   . TONSILLECTOMY AND ADENOIDECTOMY    . TOTAL ABDOMINAL  HYSTERECTOMY  1947     OB History   None      Home Medications    Prior to Admission medications   Medication Sig Start Date End Date Taking? Authorizing Provider  amLODipine (NORVASC) 5 MG tablet TAKE 1 TABLET BY MOUTH EVERY DAY Patient taking differently: TAKE 1 TABLET (5 mg) BY MOUTH EVERY DAY 10/31/17  Yes Terald Sleeper, PA-C  meclizine (ANTIVERT) 25 MG tablet Take 1 tablet (25 mg total) by mouth 3 (three) times daily as needed. 03/29/17  Yes Eustaquio Maize, MD  traMADol (ULTRAM) 50 MG tablet Take by mouth 2 (two) times daily as needed.   Yes [provider]  triamterene-hydrochlorothiazide (MAXZIDE-25) 37.5-25 MG tablet Take 1 tablet by mouth daily. 11/09/17  Yes Terald Sleeper, PA-C    Family History Family History  Problem Relation Age of Onset  . Diabetes Father   . Hypertension Father   . Hearing loss Unknown   . Stroke Mother   . Tuberculosis Sister   . Emphysema Brother   . Lung cancer Brother   . Colon polyps Son   . Colon cancer Neg Hx     Social History Social History   Tobacco Use  . Smoking  status: Former Smoker    Packs/day: 0.50    Years: 40.00    Pack years: 20.00    Types: Cigarettes    Last attempt to quit: 10/17/1982    Years since quitting: 35.3  . Smokeless tobacco: Never Used  Substance Use Topics  . Alcohol use: No  . Drug use: No     Allergies   Lipitor [atorvastatin calcium]; Nitrofurantoin monohyd macro; Penicillins; Plavix [clopidogrel bisulfate]; Statins; and Sulfa antibiotics   Review of Systems Review of Systems  All other systems reviewed and are negative.    Physical Exam Updated Vital Signs BP 133/81   Pulse 88   Temp 98.1 F (36.7 C) (Oral)   Resp (!) 23   SpO2 94%   Physical Exam  Constitutional: She is oriented to person, place, and time. She appears well-developed and well-nourished.  HENT:  Head: Normocephalic.  Eyes: EOM are normal.  Neck: Normal range of motion.  Cardiovascular: Normal rate  and regular rhythm.  Pulmonary/Chest: Effort normal.  Abdominal: She exhibits no distension.  Musculoskeletal: Normal range of motion.  Full range of motion of bilateral hips, knees, ankles.  Full range of motion bilateral shoulders, elbows, wrists.  Mild thoracic and parathoracic tenderness as well as lumbar and paralumbar tenderness without obvious step-off.  Mild tenderness of the right lateral chest wall without crepitus or bruising.  Neurological: She is alert and oriented to person, place, and time.  Psychiatric: She has a normal mood and affect.  Nursing note and vitals reviewed.    ED Treatments / Results  Labs (all labs ordered are listed, but only abnormal results are displayed) Labs Reviewed  CBC - Abnormal; Notable for the following components:      Result Value   WBC 11.8 (*)    All other components within normal limits  BASIC METABOLIC PANEL - Abnormal; Notable for the following components:   CO2 21 (*)    Glucose, Bld 106 (*)    BUN 21 (*)    Creatinine, Ser 1.04 (*)    GFR calc non Af Amer 46 (*)    GFR calc Af Amer 53 (*)    All other components within normal limits    EKG None  Radiology Ct Chest Wo Contrast  Result Date: 01/30/2018 CLINICAL DATA:  Fall.  Low back pain. EXAM: CT CHEST WITHOUT CONTRAST TECHNIQUE: Multidetector CT imaging of the chest was performed following the standard protocol without IV contrast. COMPARISON:  04/15/2012 FINDINGS: Cardiovascular: Saccular aneurysm again noted arising from the inferior aortic arch measuring approximately 17 mm, grossly unchanged. Diffuse aortic atherosclerosis. Moderate to advanced coronary artery disease diffusely. Mild cardiomegaly. Mediastinum/Nodes: No mediastinal, hilar, or axillary adenopathy. No evidence of mediastinal hematoma. Lungs/Pleura: Scarring in the upper lobes bilaterally as well as lung bases. No confluent airspace opacities, effusions or pneumothorax. Upper Abdomen: Imaging into the upper  abdomen shows no acute findings. Musculoskeletal: No acute bony abnormality. Prior L2 vertebroplasty changes noted. IMPRESSION: Stable scarring in the apices and lower lobes. Diffuse coronary artery disease, aortic atherosclerosis. Stable saccular aneurysm off the inferior aspect of the aortic arch. No acute findings. Electronically Signed   By: Rolm Baptise M.D.   On: 01/30/2018 10:54   Ct Lumbar Spine Wo Contrast  Result Date: 01/30/2018 CLINICAL DATA:  Low back pain and left lower quadrant pain since a fall outside at home this morning. Previous vertebroplasty at L2. EXAM: CT LUMBAR SPINE WITHOUT CONTRAST TECHNIQUE: Multidetector CT imaging of the lumbar spine was performed  without intravenous contrast administration. Multiplanar CT image reconstructions were also generated. COMPARISON:  MRI dated 04/18/2017 and radiographs dated 04/10/2017 FINDINGS: Segmentation: 5 lumbar type vertebrae. Alignment: Normal. Vertebrae: Old healed compression fracture of L2 treated with vertebroplasty. No acute fractures or bone destruction. Paraspinal and other soft tissues: Aortic atherosclerosis. Disc levels: T12-L1: Normal. L1-2: No disc bulging or protrusion. Chronic slight protrusion of the posterosuperior aspect of the L2 vertebral body into the spinal canal without neural impingement. Minimal degenerative changes of the facet joints. L2-3: Minimal broad-based disc bulge with no neural impingement. Minimal degenerative changes of the facet joints. L3-4: Small broad-based disc bulge with slight hypertrophy of the facet joints and ligamentum flavum creating mild narrowing of the spinal canal. L4-5: Small broad-based soft disc protrusion with hypertrophy of the ligamentum flavum creating moderate spinal stenosis. L5-S1: Calcification in the disc. No significant disc bulging or disc protrusion. Minimal degenerative changes of the facet joints. IMPRESSION: 1. No acute abnormality of the lumbar spine or upper sacrum. 2. Old  healed compression fracture of L2. 3. Moderate spinal stenosis at L4-5 due to a small broad-based disc protrusion and hypertrophy of the ligamentum flavum. 4.  Aortic Atherosclerosis (ICD10-I70.0). Electronically Signed   By: Lorriane Shire M.D.   On: 01/30/2018 10:59    Procedures Procedures (including critical care time)  Medications Ordered in ED Medications  morphine 4 MG/ML injection 4 mg (4 mg Intravenous Given 01/30/18 1023)  traMADol (ULTRAM) tablet 50 mg (50 mg Oral Given 01/30/18 1310)  cyclobenzaprine (FLEXERIL) tablet 5 mg (5 mg Oral Given 01/30/18 1311)     Initial Impression / Assessment and Plan / ED Course  I have reviewed the triage vital signs and the nursing notes.  Pertinent labs & imaging results that were available during my care of the patient were reviewed by me and considered in my medical decision making (see chart for details).     Patient feels much better at this time.  Her CT imaging of her chest as well as her lumbar spine and thoracic spine demonstrates no acute osseous abnormalities.  No pneumothorax or evidence of pulmonary contusion.  Pain improved here in the emergency department.  She was able to ambulate with a walker in the hallway.  Patient be safely discharged home at this time with continued care by her family members.  Her family members are requesting some additional assistance regarding home health and thus a home health face-to-face has been performed.  Case management will be involved.  Close primary care follow-up.  Patient and family understand to return to the ER for new or worsening symptoms  Final Clinical Impressions(s) / ED Diagnoses   Final diagnoses:  Accident due to mechanical fall without injury, initial encounter  Acute right-sided thoracic back pain    ED Discharge Orders        [place]     01/30/18 1437    Face-to-face encounter (required for Medicare/Medicaid patients)    Comments:  I Jola Schmidt certify  that this patient is under my care and that I, or a nurse practitioner or physician's assistant working with me, had a face-to-face encounter that meets the physician face-to-face encounter requirements with this patient on 01/30/2018. The encounter with the patient was in whole, or in part for the following medical condition(s) which is the primary reason for home health care (List medical condition): fall, back and side pain   01/30/18 [place],  MD 01/30/18 1444

## 2018-01-31 DIAGNOSIS — M5136 Other intervertebral disc degeneration, lumbar region: Secondary | ICD-10-CM | POA: Diagnosis not present

## 2018-01-31 DIAGNOSIS — I10 Essential (primary) hypertension: Secondary | ICD-10-CM | POA: Diagnosis not present

## 2018-01-31 DIAGNOSIS — Z8673 Personal history of transient ischemic attack (TIA), and cerebral infarction without residual deficits: Secondary | ICD-10-CM | POA: Diagnosis not present

## 2018-01-31 DIAGNOSIS — Z87891 Personal history of nicotine dependence: Secondary | ICD-10-CM | POA: Diagnosis not present

## 2018-02-05 ENCOUNTER — Ambulatory Visit (INDEPENDENT_AMBULATORY_CARE_PROVIDER_SITE_OTHER): Payer: Medicare Other | Admitting: Family

## 2018-02-05 ENCOUNTER — Encounter: Payer: Self-pay | Admitting: Family

## 2018-02-05 VITALS — BP 148/70 | HR 64 | Ht 65.0 in | Wt 110.0 lb

## 2018-02-05 DIAGNOSIS — Z9181 History of falling: Secondary | ICD-10-CM | POA: Diagnosis not present

## 2018-02-05 DIAGNOSIS — R29898 Other symptoms and signs involving the musculoskeletal system: Secondary | ICD-10-CM

## 2018-02-05 DIAGNOSIS — I10 Essential (primary) hypertension: Secondary | ICD-10-CM | POA: Diagnosis not present

## 2018-02-05 DIAGNOSIS — K5900 Constipation, unspecified: Secondary | ICD-10-CM | POA: Diagnosis not present

## 2018-02-05 DIAGNOSIS — G8929 Other chronic pain: Secondary | ICD-10-CM | POA: Diagnosis not present

## 2018-02-05 DIAGNOSIS — M5136 Other intervertebral disc degeneration, lumbar region: Secondary | ICD-10-CM | POA: Diagnosis not present

## 2018-02-05 DIAGNOSIS — M545 Low back pain: Secondary | ICD-10-CM | POA: Diagnosis not present

## 2018-02-05 DIAGNOSIS — Z681 Body mass index (BMI) 19 or less, adult: Secondary | ICD-10-CM | POA: Diagnosis not present

## 2018-02-05 DIAGNOSIS — W19XXXS Unspecified fall, sequela: Secondary | ICD-10-CM

## 2018-02-05 DIAGNOSIS — M5442 Lumbago with sciatica, left side: Secondary | ICD-10-CM

## 2018-02-05 DIAGNOSIS — M79604 Pain in right leg: Secondary | ICD-10-CM

## 2018-02-05 DIAGNOSIS — M5441 Lumbago with sciatica, right side: Secondary | ICD-10-CM | POA: Diagnosis not present

## 2018-02-05 NOTE — Progress Notes (Signed)
   Subjective:    Patient ID: Belinda Webb, female    DOB: 09-Jun-1925, 84 y.o.   MRN: [id number]  HPI Pt presents to the office today for Face to face for placement for SNF. Pt is brought in by both sons today stating she can live alone any longer because of safety concerns. States the patient fell on 01/30/18 trying to get her newspaper. Pt "crawled by inside and pressed her life alert". She was transported to [facility] and had negative x-ray and ct scan.   PT is currently being followed by Bartolo Darter, PA for has Chronic cough; Stroke [facility]); Hoarseness; Esophageal dysmotility; Fall; Hypertension; Cancer of chest (wall) ([place]); BMI less than 19,adult; Lumbar pain with radiation down both legs; Weakness of both lower extremities; History of fall; Risk for falls; and DDD (degenerative disc disease), lumbar on their problem list.   The sons report since Tuesday they have rotated with someone staying with the patient at all times.   She reports she has constant lower and upper back pain of 10 out 10. She does not like to take "medication", but has taken tylenol with mild relief. She is also complaining of constipation and has tried "otc pills" with mild relief.     Review of Systems  Musculoskeletal: Positive for back pain.  All other systems reviewed and are negative.      Objective:   Physical Exam  Constitutional: She is oriented to person, place, and time. She appears well-developed and well-nourished. No distress.  HENT:  Head: Normocephalic.  Mouth/Throat: Oropharynx is clear and moist.  HOH   Eyes: Pupils are equal, round, and reactive to light.  Neck: Normal range of motion. Neck supple. No thyromegaly present.  Cardiovascular: Normal rate, regular rhythm, normal heart sounds and intact distal pulses.  No murmur heard. Pulmonary/Chest: Effort normal and breath sounds normal. No respiratory distress. She has no wheezes.  Abdominal: Soft. Bowel sounds are normal. She exhibits  distension. There is tenderness (mild generalized).  Musculoskeletal: She exhibits edema (trace BLE). She exhibits no tenderness.  Pain in lumbar and thoracic area with standing or moving.   Neurological: She is alert and oriented to person, place, and time.  Skin: Skin is warm and dry.  Psychiatric: She has a normal mood and affect. Her behavior is normal. Judgment and thought content normal.  Vitals reviewed.     BP (!) 148/70   Pulse 64   Ht 5\' 5"  (1.651 m)   Wt 110 lb (49.9 kg)   BMI 18.30 kg/m      Assessment & Plan:  1. Essential hypertension  2. Fall, sequela  3. Risk for falls  4. Lumbar pain with radiation down both legs  5. BMI less than 19,adult  6. DDD (degenerative disc disease), lumbar  7. Weakness of both lower extremities  8. Chronic bilateral low back pain with bilateral sciatica  9. Constipation, unspecified constipation type    Continue all meds Discussed in length fall preventions, remove all rugs, beside commode, proper lighting Start Miralax daily for constipation Force fluids Tried to discuss pain management that she should be taking Ultram TID and not waiting until pain is a 10 out 10, but pt states she does not want to "drugged or drunk".  Keep follow up with PCP as needed  Evelina Dun, FNP

## 2018-02-05 NOTE — Patient Instructions (Signed)
Fall Prevention in the Home Falls can cause injuries. They can happen to people of all ages. There are many things you can do to make your home safe and to help prevent falls. What can I do on the outside of my home?  Regularly fix the edges of walkways and driveways and fix any cracks.  Remove anything that might make you trip as you walk through a door, such as a raised step or threshold.  Trim any bushes or trees on the path to your home.  Use bright outdoor lighting.  Clear any walking paths of anything that might make someone trip, such as rocks or tools.  Regularly check to see if handrails are loose or broken. Make sure that both sides of any steps have handrails.  Any raised decks and porches should have guardrails on the edges.  Have any leaves, snow, or ice cleared regularly.  Use sand or salt on walking paths during winter.  Clean up any spills in your garage right away. This includes oil or grease spills. What can I do in the bathroom?  Use night lights.  Install grab bars by the toilet and in the tub and shower. Do not use towel bars as grab bars.  Use non-skid mats or decals in the tub or shower.  If you need to sit down in the shower, use a plastic, non-slip stool.  Keep the floor dry. Clean up any water that spills on the floor as soon as it happens.  Remove soap buildup in the tub or shower regularly.  Attach bath mats securely with double-sided non-slip rug tape.  Do not have throw rugs and other things on the floor that can make you trip. What can I do in the bedroom?  Use night lights.  Make sure that you have a light by your bed that is easy to reach.  Do not use any sheets or blankets that are too big for your bed. They should not hang down onto the floor.  Have a firm chair that has side arms. You can use this for support while you get dressed.  Do not have throw rugs and other things on the floor that can make you trip. What can I do in the  kitchen?  Clean up any spills right away.  Avoid walking on wet floors.  Keep items that you use a lot in easy-to-reach places.  If you need to reach something above you, use a strong step stool that has a grab bar.  Keep electrical cords out of the way.  Do not use floor polish or wax that makes floors slippery. If you must use wax, use non-skid floor wax.  Do not have throw rugs and other things on the floor that can make you trip. What can I do with my stairs?  Do not leave any items on the stairs.  Make sure that there are handrails on both sides of the stairs and use them. Fix handrails that are broken or loose. Make sure that handrails are as long as the stairways.  Check any carpeting to make sure that it is firmly attached to the stairs. Fix any carpet that is loose or worn.  Avoid having throw rugs at the top or bottom of the stairs. If you do have throw rugs, attach them to the floor with carpet tape.  Make sure that you have a light switch at the top of the stairs and the bottom of the stairs. If you do   not have them, ask someone to add them for you. What else can I do to help prevent falls?  Wear shoes that: ? Do not have high heels. ? Have rubber bottoms. ? Are comfortable and fit you well. ? Are closed at the toe. Do not wear sandals.  If you use a stepladder: ? Make sure that it is fully opened. Do not climb a closed stepladder. ? Make sure that both sides of the stepladder are locked into place. ? Ask someone to hold it for you, if possible.  Clearly mark and make sure that you can see: ? Any grab bars or handrails. ? First and last steps. ? Where the edge of each step is.  Use tools that help you move around (mobility aids) if they are needed. These include: ? Canes. ? Walkers. ? Scooters. ? Crutches.  Turn on the lights when you go into a dark area. Replace any light bulbs as soon as they burn out.  Set up your furniture so you have a clear path.  Avoid moving your furniture around.  If any of your floors are uneven, fix them.  If there are any pets around you, be aware of where they are.  Review your medicines with your doctor. Some medicines can make you feel dizzy. This can increase your chance of falling. Ask your doctor what other things that you can do to help prevent falls. This information is not intended to replace advice given to you by your health care provider. Make sure you discuss any questions you have with your health care provider. Document Released: 07/30/2009 Document Revised: 03/10/2016 Document Reviewed: 11/07/2014 Elsevier Interactive Patient Education  2018 [employer]. Constipation, Adult Constipation is when a person has fewer bowel movements in a week than normal, has difficulty having a bowel movement, or has stools that are dry, hard, or larger than normal. Constipation may be caused by an underlying condition. It may become worse with age if a person takes certain medicines and does not take in enough fluids. Follow these instructions at home: Eating and drinking   Eat foods that have a lot of fiber, such as fresh fruits and vegetables, whole grains, and beans.  Limit foods that are high in fat, low in fiber, or overly processed, such as french fries, hamburgers, cookies, candies, and soda.  Drink enough fluid to keep your urine clear or pale yellow. General instructions  Exercise regularly or as told by your health care provider.  Go to the restroom when you have the urge to go. Do not hold it in.  Take over-the-counter and prescription medicines only as told by your health care provider. These include any fiber supplements.  Practice pelvic floor retraining exercises, such as deep breathing while relaxing the lower abdomen and pelvic floor relaxation during bowel movements.  Watch your condition for any changes.  Keep all follow-up visits as told by your health care provider. This is  important. Contact a health care provider if:  You have pain that gets worse.  You have a fever.  You do not have a bowel movement after 4 days.  You vomit.  You are not hungry.  You lose weight.  You are bleeding from the anus.  You have thin, pencil-like stools. Get help right away if:  You have a fever and your symptoms suddenly get worse.  You leak stool or have blood in your stool.  Your abdomen is bloated.  You have severe pain in your abdomen.  You feel dizzy or you faint. This information is not intended to replace advice given to you by your health care provider. Make sure you discuss any questions you have with your health care provider. Document Released: 07/01/2004 Document Revised: 04/22/2016 Document Reviewed: 03/23/2016 Elsevier Interactive Patient Education  2018 [employer].

## 2018-02-08 ENCOUNTER — Telehealth: Payer: Self-pay | Admitting: *Deleted

## 2018-02-08 NOTE — Telephone Encounter (Signed)
May try miralax over the counter, 1/2-1 capful in juice each day. It is a laxative that pulls water back into the colon.

## 2018-02-08 NOTE — Telephone Encounter (Signed)
[facility] nurse saw pt today Has not had bowel movement in more than 7 days, she is a little distended in the abdomen, does have active bowel sounds. Instructed son to get prune juice. Please advise

## 2018-02-08 NOTE — Telephone Encounter (Signed)
[facility] aware.

## 2018-02-13 ENCOUNTER — Ambulatory Visit (INDEPENDENT_AMBULATORY_CARE_PROVIDER_SITE_OTHER): Payer: Medicare Other

## 2018-02-13 DIAGNOSIS — Z8673 Personal history of transient ischemic attack (TIA), and cerebral infarction without residual deficits: Secondary | ICD-10-CM | POA: Diagnosis not present

## 2018-02-13 DIAGNOSIS — Z87891 Personal history of nicotine dependence: Secondary | ICD-10-CM | POA: Diagnosis not present

## 2018-02-13 DIAGNOSIS — I10 Essential (primary) hypertension: Secondary | ICD-10-CM

## 2018-02-13 DIAGNOSIS — Z9181 History of falling: Secondary | ICD-10-CM | POA: Diagnosis not present

## 2018-02-13 DIAGNOSIS — M5136 Other intervertebral disc degeneration, lumbar region: Secondary | ICD-10-CM

## 2018-03-01 DIAGNOSIS — M5136 Other intervertebral disc degeneration, lumbar region: Secondary | ICD-10-CM | POA: Diagnosis not present

## 2018-04-01 DIAGNOSIS — M5136 Other intervertebral disc degeneration, lumbar region: Secondary | ICD-10-CM | POA: Diagnosis not present

## 2018-04-04 DIAGNOSIS — Z79891 Long term (current) use of opiate analgesic: Secondary | ICD-10-CM | POA: Diagnosis not present

## 2018-04-28 ENCOUNTER — Other Ambulatory Visit: Payer: Self-pay | Admitting: Physician Assistant

## 2018-05-01 DIAGNOSIS — M5136 Other intervertebral disc degeneration, lumbar region: Secondary | ICD-10-CM | POA: Diagnosis not present

## 2018-06-01 DIAGNOSIS — M5136 Other intervertebral disc degeneration, lumbar region: Secondary | ICD-10-CM | POA: Diagnosis not present

## 2018-07-02 DIAGNOSIS — M5136 Other intervertebral disc degeneration, lumbar region: Secondary | ICD-10-CM | POA: Diagnosis not present

## 2018-07-28 ENCOUNTER — Other Ambulatory Visit: Payer: Self-pay | Admitting: Physician Assistant

## 2018-08-01 DIAGNOSIS — M5136 Other intervertebral disc degeneration, lumbar region: Secondary | ICD-10-CM | POA: Diagnosis not present

## 2018-08-03 DIAGNOSIS — G894 Chronic pain syndrome: Secondary | ICD-10-CM | POA: Diagnosis not present

## 2018-08-03 DIAGNOSIS — M545 Low back pain: Secondary | ICD-10-CM | POA: Diagnosis not present

## 2018-08-06 DIAGNOSIS — L814 Other melanin hyperpigmentation: Secondary | ICD-10-CM | POA: Diagnosis not present

## 2018-08-06 DIAGNOSIS — D229 Melanocytic nevi, unspecified: Secondary | ICD-10-CM | POA: Diagnosis not present

## 2018-08-06 DIAGNOSIS — L570 Actinic keratosis: Secondary | ICD-10-CM | POA: Diagnosis not present

## 2018-08-06 DIAGNOSIS — D1801 Hemangioma of skin and subcutaneous tissue: Secondary | ICD-10-CM | POA: Diagnosis not present

## 2018-09-01 DIAGNOSIS — M5136 Other intervertebral disc degeneration, lumbar region: Secondary | ICD-10-CM | POA: Diagnosis not present

## 2018-09-05 ENCOUNTER — Other Ambulatory Visit: Payer: Self-pay | Admitting: Family

## 2018-09-05 NOTE — Telephone Encounter (Signed)
Last seen 02/05/18

## 2018-10-01 DIAGNOSIS — G894 Chronic pain syndrome: Secondary | ICD-10-CM | POA: Diagnosis not present

## 2018-10-01 DIAGNOSIS — M5136 Other intervertebral disc degeneration, lumbar region: Secondary | ICD-10-CM | POA: Diagnosis not present

## 2018-10-01 DIAGNOSIS — Z79891 Long term (current) use of opiate analgesic: Secondary | ICD-10-CM | POA: Diagnosis not present

## 2018-10-01 DIAGNOSIS — M25551 Pain in right hip: Secondary | ICD-10-CM | POA: Diagnosis not present

## 2018-10-03 DIAGNOSIS — M25551 Pain in right hip: Secondary | ICD-10-CM | POA: Diagnosis not present

## 2018-10-24 DIAGNOSIS — G894 Chronic pain syndrome: Secondary | ICD-10-CM | POA: Diagnosis not present

## 2018-10-24 DIAGNOSIS — M1611 Unilateral primary osteoarthritis, right hip: Secondary | ICD-10-CM | POA: Diagnosis not present

## 2018-10-29 ENCOUNTER — Emergency Department (HOSPITAL_COMMUNITY): Payer: Medicare Other

## 2018-10-29 ENCOUNTER — Other Ambulatory Visit: Payer: Self-pay

## 2018-10-29 ENCOUNTER — Inpatient Hospital Stay (HOSPITAL_COMMUNITY)
Admission: EM | Admit: 2018-10-29 | Discharge: 2018-11-01 | DRG: 552 | Disposition: A | Discharge status: Skilled Nursing Facility | Payer: Medicare Other | Attending: Internal Medicine | Admitting: Internal Medicine

## 2018-10-29 ENCOUNTER — Encounter (HOSPITAL_COMMUNITY): Payer: Self-pay

## 2018-10-29 DIAGNOSIS — M1611 Unilateral primary osteoarthritis, right hip: Secondary | ICD-10-CM | POA: Diagnosis not present

## 2018-10-29 DIAGNOSIS — N183 Chronic kidney disease, stage 3 unspecified: Secondary | ICD-10-CM

## 2018-10-29 DIAGNOSIS — E785 Hyperlipidemia, unspecified: Secondary | ICD-10-CM | POA: Diagnosis present

## 2018-10-29 DIAGNOSIS — E872 Acidosis, unspecified: Secondary | ICD-10-CM

## 2018-10-29 DIAGNOSIS — I639 Cerebral infarction, unspecified: Secondary | ICD-10-CM | POA: Diagnosis not present

## 2018-10-29 DIAGNOSIS — Z8673 Personal history of transient ischemic attack (TIA), and cerebral infarction without residual deficits: Secondary | ICD-10-CM

## 2018-10-29 DIAGNOSIS — I10 Essential (primary) hypertension: Secondary | ICD-10-CM

## 2018-10-29 DIAGNOSIS — Z9181 History of falling: Secondary | ICD-10-CM

## 2018-10-29 DIAGNOSIS — S79921A Unspecified injury of right thigh, initial encounter: Secondary | ICD-10-CM | POA: Diagnosis not present

## 2018-10-29 DIAGNOSIS — Z882 Allergy status to sulfonamides status: Secondary | ICD-10-CM | POA: Diagnosis not present

## 2018-10-29 DIAGNOSIS — W19XXXA Unspecified fall, initial encounter: Secondary | ICD-10-CM | POA: Diagnosis not present

## 2018-10-29 DIAGNOSIS — M549 Dorsalgia, unspecified: Secondary | ICD-10-CM | POA: Diagnosis present

## 2018-10-29 DIAGNOSIS — S32059A Unspecified fracture of fifth lumbar vertebra, initial encounter for closed fracture: Secondary | ICD-10-CM | POA: Diagnosis not present

## 2018-10-29 DIAGNOSIS — S32059D Unspecified fracture of fifth lumbar vertebra, subsequent encounter for fracture with routine healing: Secondary | ICD-10-CM | POA: Diagnosis not present

## 2018-10-29 DIAGNOSIS — S3210XA Unspecified fracture of sacrum, initial encounter for closed fracture: Secondary | ICD-10-CM | POA: Diagnosis not present

## 2018-10-29 DIAGNOSIS — R339 Retention of urine, unspecified: Secondary | ICD-10-CM | POA: Diagnosis present

## 2018-10-29 DIAGNOSIS — M2550 Pain in unspecified joint: Secondary | ICD-10-CM | POA: Diagnosis not present

## 2018-10-29 DIAGNOSIS — Z87891 Personal history of nicotine dependence: Secondary | ICD-10-CM

## 2018-10-29 DIAGNOSIS — M79651 Pain in right thigh: Secondary | ICD-10-CM | POA: Diagnosis not present

## 2018-10-29 DIAGNOSIS — Z79899 Other long term (current) drug therapy: Secondary | ICD-10-CM | POA: Diagnosis not present

## 2018-10-29 DIAGNOSIS — R748 Abnormal levels of other serum enzymes: Secondary | ICD-10-CM | POA: Diagnosis present

## 2018-10-29 DIAGNOSIS — H9190 Unspecified hearing loss, unspecified ear: Secondary | ICD-10-CM | POA: Diagnosis not present

## 2018-10-29 DIAGNOSIS — Y92009 Unspecified place in unspecified non-institutional (private) residence as the place of occurrence of the external cause: Secondary | ICD-10-CM | POA: Diagnosis not present

## 2018-10-29 DIAGNOSIS — W1830XA Fall on same level, unspecified, initial encounter: Secondary | ICD-10-CM | POA: Diagnosis present

## 2018-10-29 DIAGNOSIS — M25552 Pain in left hip: Secondary | ICD-10-CM

## 2018-10-29 DIAGNOSIS — M79652 Pain in left thigh: Secondary | ICD-10-CM | POA: Diagnosis not present

## 2018-10-29 DIAGNOSIS — S3991XA Unspecified injury of abdomen, initial encounter: Secondary | ICD-10-CM | POA: Diagnosis not present

## 2018-10-29 DIAGNOSIS — M25551 Pain in right hip: Secondary | ICD-10-CM | POA: Diagnosis not present

## 2018-10-29 DIAGNOSIS — I129 Hypertensive chronic kidney disease with stage 1 through stage 4 chronic kidney disease, or unspecified chronic kidney disease: Secondary | ICD-10-CM | POA: Diagnosis not present

## 2018-10-29 DIAGNOSIS — Z888 Allergy status to other drugs, medicaments and biological substances status: Secondary | ICD-10-CM | POA: Diagnosis not present

## 2018-10-29 DIAGNOSIS — M5136 Other intervertebral disc degeneration, lumbar region: Secondary | ICD-10-CM | POA: Diagnosis not present

## 2018-10-29 DIAGNOSIS — Z7401 Bed confinement status: Secondary | ICD-10-CM | POA: Diagnosis not present

## 2018-10-29 DIAGNOSIS — M545 Low back pain, unspecified: Secondary | ICD-10-CM

## 2018-10-29 DIAGNOSIS — Z66 Do not resuscitate: Secondary | ICD-10-CM | POA: Diagnosis not present

## 2018-10-29 DIAGNOSIS — E876 Hypokalemia: Secondary | ICD-10-CM | POA: Diagnosis not present

## 2018-10-29 DIAGNOSIS — G459 Transient cerebral ischemic attack, unspecified: Secondary | ICD-10-CM | POA: Diagnosis not present

## 2018-10-29 DIAGNOSIS — M5416 Radiculopathy, lumbar region: Secondary | ICD-10-CM | POA: Diagnosis not present

## 2018-10-29 DIAGNOSIS — I69321 Dysphasia following cerebral infarction: Secondary | ICD-10-CM | POA: Diagnosis not present

## 2018-10-29 DIAGNOSIS — S32029S Unspecified fracture of second lumbar vertebra, sequela: Secondary | ICD-10-CM | POA: Diagnosis not present

## 2018-10-29 DIAGNOSIS — S32051A Stable burst fracture of fifth lumbar vertebra, initial encounter for closed fracture: Secondary | ICD-10-CM | POA: Diagnosis not present

## 2018-10-29 DIAGNOSIS — S79922A Unspecified injury of left thigh, initial encounter: Secondary | ICD-10-CM | POA: Diagnosis not present

## 2018-10-29 DIAGNOSIS — S32009A Unspecified fracture of unspecified lumbar vertebra, initial encounter for closed fracture: Secondary | ICD-10-CM

## 2018-10-29 DIAGNOSIS — I2510 Atherosclerotic heart disease of native coronary artery without angina pectoris: Secondary | ICD-10-CM | POA: Diagnosis not present

## 2018-10-29 DIAGNOSIS — S3210XD Unspecified fracture of sacrum, subsequent encounter for fracture with routine healing: Secondary | ICD-10-CM | POA: Diagnosis not present

## 2018-10-29 DIAGNOSIS — Z880 Allergy status to penicillin: Secondary | ICD-10-CM

## 2018-10-29 DIAGNOSIS — M25559 Pain in unspecified hip: Secondary | ICD-10-CM | POA: Diagnosis present

## 2018-10-29 LAB — COMPREHENSIVE METABOLIC PANEL
ALK PHOS: 237 U/L — AB (ref 38–126)
ALT: 10 U/L (ref 0–44)
ANION GAP: 12 (ref 5–15)
AST: 20 U/L (ref 15–41)
Albumin: 3.4 g/dL — ABNORMAL LOW (ref 3.5–5.0)
BILIRUBIN TOTAL: 1.2 mg/dL (ref 0.3–1.2)
BUN: 20 mg/dL (ref 8–23)
CALCIUM: 8.6 mg/dL — AB (ref 8.9–10.3)
CO2: 19 mmol/L — ABNORMAL LOW (ref 22–32)
CREATININE: 1.09 mg/dL — AB (ref 0.44–1.00)
Chloride: 106 mmol/L (ref 98–111)
GFR, EST AFRICAN AMERICAN: 51 mL/min — AB (ref >60)
GFR, EST NON AFRICAN AMERICAN: 44 mL/min — AB (ref >60)
Glucose, Bld: 98 mg/dL (ref 70–99)
Potassium: 3.4 mmol/L — ABNORMAL LOW (ref 3.5–5.1)
SODIUM: 137 mmol/L (ref 135–145)
TOTAL PROTEIN: 6.6 g/dL (ref 6.5–8.1)

## 2018-10-29 LAB — CBC WITH DIFFERENTIAL/PLATELET
Abs Immature Granulocytes: 0.05 10*3/uL (ref 0.00–0.07)
Basophils Absolute: 0 10*3/uL (ref 0.0–0.1)
Basophils Relative: 0 %
Eosinophils Absolute: 0.1 10*3/uL (ref 0.0–0.5)
Eosinophils Relative: 1 %
HEMATOCRIT: 42.7 % (ref 36.0–46.0)
HEMOGLOBIN: 13.6 g/dL (ref 12.0–15.0)
Immature Granulocytes: 1 %
LYMPHS PCT: 32 %
Lymphs Abs: 3 10*3/uL (ref 0.7–4.0)
MCH: 31.3 pg (ref 26.0–34.0)
MCHC: 31.9 g/dL (ref 30.0–36.0)
MCV: 98.4 fL (ref 80.0–100.0)
MONO ABS: 1 10*3/uL (ref 0.1–1.0)
Monocytes Relative: 11 %
Neutro Abs: 5.3 10*3/uL (ref 1.7–7.7)
Neutrophils Relative %: 55 %
Platelets: 270 10*3/uL (ref 150–400)
RBC: 4.34 MIL/uL (ref 3.87–5.11)
RDW: 12 % (ref 11.5–15.5)
WBC: 9.4 10*3/uL (ref 4.0–10.5)
nRBC: 0 % (ref 0.0–0.2)

## 2018-10-29 LAB — URINALYSIS, ROUTINE W REFLEX MICROSCOPIC
Bilirubin Urine: NEGATIVE
Glucose, UA: NEGATIVE mg/dL
Hgb urine dipstick: NEGATIVE
Ketones, ur: 20 mg/dL — AB
Leukocytes, UA: NEGATIVE
Nitrite: NEGATIVE
Protein, ur: NEGATIVE mg/dL
SPECIFIC GRAVITY, URINE: 1.011 (ref 1.005–1.030)
pH: 8 (ref 5.0–8.0)

## 2018-10-29 LAB — LIPASE, BLOOD: Lipase: 33 U/L (ref 11–51)

## 2018-10-29 MED ORDER — ACETAMINOPHEN 325 MG PO TABS
650.0000 mg | ORAL_TABLET | Freq: Four times a day (QID) | ORAL | Status: DC | PRN
  Administered 2018-10-31 – 2018-11-01 (×3): 650 mg via ORAL
  Filled 2018-10-29 (×3): qty 2

## 2018-10-29 MED ORDER — MECLIZINE HCL 25 MG PO TABS
25.0000 mg | ORAL_TABLET | Freq: Every day | ORAL | Status: DC | PRN
  Filled 2018-10-29: qty 1

## 2018-10-29 MED ORDER — POTASSIUM CHLORIDE CRYS ER 20 MEQ PO TBCR
40.0000 meq | EXTENDED_RELEASE_TABLET | Freq: Once | ORAL | Status: AC
  Administered 2018-10-29: 40 meq via ORAL
  Filled 2018-10-29: qty 2

## 2018-10-29 MED ORDER — MORPHINE SULFATE (PF) 2 MG/ML IV SOLN
1.0000 mg | INTRAVENOUS | Status: DC | PRN
  Administered 2018-10-30 (×4): 1 mg via INTRAVENOUS
  Filled 2018-10-29 (×4): qty 1

## 2018-10-29 MED ORDER — AMLODIPINE BESYLATE 5 MG PO TABS
5.0000 mg | ORAL_TABLET | Freq: Every day | ORAL | Status: DC
  Administered 2018-10-30 – 2018-11-01 (×3): 5 mg via ORAL
  Filled 2018-10-29 (×3): qty 1

## 2018-10-29 MED ORDER — METHOCARBAMOL 500 MG PO TABS: 500.0000 mg | ORAL_TABLET | Freq: Once | ORAL | Status: DC

## 2018-10-29 MED ORDER — ACETAMINOPHEN 500 MG PO TABS
1000.0000 mg | ORAL_TABLET | Freq: Once | ORAL | Status: AC
  Administered 2018-10-29: 1000 mg via ORAL
  Filled 2018-10-29: qty 2

## 2018-10-29 MED ORDER — ENOXAPARIN SODIUM 30 MG/0.3ML ~~LOC~~ SOLN
30.0000 mg | SUBCUTANEOUS | Status: DC
  Administered 2018-10-30 – 2018-11-01 (×3): 30 mg via SUBCUTANEOUS
  Filled 2018-10-29 (×3): qty 0.3

## 2018-10-29 MED ORDER — OXYCODONE HCL 5 MG PO TABS
2.5000 mg | ORAL_TABLET | Freq: Once | ORAL | Status: AC
  Administered 2018-10-29: 2.5 mg via ORAL
  Filled 2018-10-29: qty 1

## 2018-10-29 NOTE — ED Triage Notes (Signed)
Pt from home w/ a c/o left sided hip pain and left lower back pain that she sustained from a fall that occurred some time this past week. No LOC. Pt reported that her legs gave out on her. There is a "knot" located left of the pt's lumbar spine that is tender to palpation.

## 2018-10-29 NOTE — ED Provider Notes (Signed)
[facility] EMERGENCY DEPARTMENT Provider Note   CSN: [id number] Arrival date & time: 10/29/18  1513     History   Chief Complaint Chief Complaint  Patient presents with  . Hip Pain    HPI Belinda Webb is a 85 y.o. female.  Belinda Webb is a 85 y.o. female with PMH of HLD, HTN, left upper lung lung tumor, stroke, vertigo who presents with her sons at the bedside with worsening bilateral hip pain, left and right, and low back pain.  The patient has a history of chronic right and left hip pain as well as back pain.  States she saw orthopedic surgery about 2 weeks ago and had MRI of her right hip in town, Dr. Nelva Bush, who found no acute pathology other than "inflammation" and performed a steroid injection.  She lives at home alone and has home health coming about 3 times per day.  On Friday, they reportedly found her on the ground at home.  She states that shortly before home health arrived she had no chest pain or palpitations and does not think that she lost consciousness.  Did not hit her head and had no neck pain.  Did have worsening low back pain and left hip pain at that time.  She ambulates with a walker at baseline and has required significant help walking with her walker, more than normal, for the last several days.  Her family is concerned that she is unable to stay at home at this time and is pursuing possible nursing facility placement.  Not able to see the PCP until January 28.  Reports that her pain in her left low back and left hip have been significantly worsening despite taking 15 mg of OxyContin daily for the past 2 to 3 days.  She has had subjective difficulty emptying her bladder and pees about 8-9 times per night.  Has chronic constipation with no change in her bowel movement frequency.  No dysuria or hematuria.  States her legs intermittently will give out but at other times she is able to stand and ambulate with her walker with assistance.  Past  Medical History:  Diagnosis Date  . Chronic cough   . HLD (hyperlipidemia)   . HTN (hypertension)   . Lung tumor 1984   LUL  . Stroke ([place])   . Vertigo     Patient Active Problem List   Diagnosis Date Noted  . Hip pain 10/29/2018  . Sacral fracture ([place]) 10/29/2018  . Lumbar vertebral fracture ([place]) 10/29/2018  . Lower back pain 10/29/2018  . Hypokalemia 10/29/2018  . Normal anion gap metabolic acidosis 79/39/0300  . Elevated alkaline phosphatase level 10/29/2018  . CKD (chronic kidney disease) stage 3, GFR 30-59 ml/min ([facility]) 10/29/2018  . DDD (degenerative disc disease), lumbar 05/15/2017  . Lumbar pain with radiation down both legs 04/11/2017  . Weakness of both lower extremities 04/11/2017  . History of fall 04/11/2017  . Risk for falls 04/11/2017  . BMI less than 19,adult 05/06/2015  . Cancer of chest (wall) ([place]) 01/21/2015  . HTN (hypertension) 04/16/2012  . Fall 04/15/2012  . Hoarseness 11/22/2011  . Esophageal dysmotility 11/22/2011  . Chronic cough   . Stroke [facility])     Past Surgical History:  Procedure Laterality Date  . APPENDECTOMY  1938  . BACK SURGERY    . BREAST BIOPSY     left  . CATARACT EXTRACTION, BILATERAL    . [place]  LUL   . TONSILLECTOMY AND ADENOIDECTOMY    . TOTAL ABDOMINAL HYSTERECTOMY  1947     OB History   No obstetric history on file.      Home Medications    Prior to Admission medications   Medication Sig Start Date End Date Taking? Authorizing Provider  amLODipine (NORVASC) 5 MG tablet TAKE 1 TABLET BY MOUTH EVERY DAY Patient taking differently: Take 5 mg by mouth daily.  09/05/18  Yes Terald Sleeper, PA-C  meclizine (ANTIVERT) 25 MG tablet Take 25 mg by mouth daily as needed for dizziness (vertigo).   Yes [provider]  oxyCODONE (OXY IR/ROXICODONE) 5 MG immediate release tablet Take 5 mg by mouth 3 (three) times daily.   Yes [provider]  triamterene-hydrochlorothiazide (MAXZIDE-25)  37.5-25 MG tablet Take 1 tablet by mouth daily. Patient not taking: Reported on 10/29/2018 11/09/17   Terald Sleeper, PA-C    Family History Family History  Problem Relation Age of Onset  . Diabetes Father   . Hypertension Father   . Hearing loss Other   . Stroke Mother   . Tuberculosis Sister   . Emphysema Brother   . Lung cancer Brother   . Colon polyps Son   . Colon cancer Neg Hx     Social History Social History   Tobacco Use  . Smoking status: Former Smoker    Packs/day: 0.50    Years: 40.00    Pack years: 20.00    Types: Cigarettes    Last attempt to quit: 10/17/1982    Years since quitting: 36.0  . Smokeless tobacco: Never Used  Substance Use Topics  . Alcohol use: No  . Drug use: No     Allergies   Lipitor [atorvastatin calcium]; Nitrofurantoin monohyd macro; Penicillins; Plavix [clopidogrel bisulfate]; Statins; and Sulfa antibiotics   Review of Systems Review of Systems  Constitutional: Negative for chills and fever.  HENT: Negative for ear pain and sore throat.   Eyes: Negative for pain and visual disturbance.  Respiratory: Negative for cough and shortness of breath.   Cardiovascular: Negative for chest pain and palpitations.  Gastrointestinal: Negative for abdominal pain and vomiting.  Genitourinary: Negative for dysuria and hematuria.  Musculoskeletal: Positive for arthralgias, back pain and myalgias.  Skin: Negative for color change and rash.  Neurological: Positive for weakness. Negative for seizures and syncope.  All other systems reviewed and are negative.    Physical Exam Updated Vital Signs BP (!) 153/76 (BP Location: Right Arm)   Pulse (!) 57   Temp 98.8 F (37.1 C) (Oral)   Resp 16   Ht 5' (1.524 m)   Wt 44.7 kg   SpO2 100%   BMI 19.25 kg/m   Physical Exam Vitals signs and nursing note reviewed.  Constitutional:      General: She is not in acute distress.    Appearance: She is well-developed and underweight. She is  ill-appearing.  HENT:     Head: Normocephalic and atraumatic.  Eyes:     Conjunctiva/sclera: Conjunctivae normal.  Neck:     Musculoskeletal: Neck supple.  Cardiovascular:     Rate and Rhythm: Normal rate and regular rhythm.     Pulses:          Dorsalis pedis pulses are 2+ on the right side and 2+ on the left side.     Heart sounds: No murmur.  Pulmonary:     Effort: Pulmonary effort is normal. No respiratory distress.  Breath sounds: Normal breath sounds.  Abdominal:     Palpations: Abdomen is soft.     Tenderness: There is no abdominal tenderness.  Musculoskeletal:     Right hip: She exhibits tenderness and bony tenderness.     Left hip: She exhibits tenderness and bony tenderness. She exhibits normal range of motion.     Cervical back: Normal.     Thoracic back: Normal.     Lumbar back: She exhibits tenderness, bony tenderness and spasm. She exhibits no deformity.  Skin:    General: Skin is warm and dry.  Neurological:     General: No focal deficit present.     Mental Status: She is alert and oriented to person, place, and time.     GCS: GCS eye subscore is 4. GCS verbal subscore is 5. GCS motor subscore is 6.     Cranial Nerves: Cranial nerves are intact.     Sensory: Sensation is intact.     Motor: Motor function is intact.     Coordination: Coordination is intact.  Psychiatric:        Behavior: Behavior is cooperative.      ED Treatments / Results  Labs (all labs ordered are listed, but only abnormal results are displayed) Labs Reviewed  URINALYSIS, ROUTINE W REFLEX MICROSCOPIC - Abnormal; Notable for the following components:      Result Value   Ketones, ur 20 (*)    All other components within normal limits  COMPREHENSIVE METABOLIC PANEL - Abnormal; Notable for the following components:   Potassium 3.4 (*)    CO2 19 (*)    Creatinine, Ser 1.09 (*)    Calcium 8.6 (*)    Albumin 3.4 (*)    Alkaline Phosphatase 237 (*)    GFR calc non Af Amer 44 (*)     GFR calc Af Amer 51 (*)    All other components within normal limits  CBC WITH DIFFERENTIAL/PLATELET  LIPASE, BLOOD  BASIC METABOLIC PANEL  GAMMA GT  MAGNESIUM    EKG None  Radiology Ct Abdomen Pelvis Wo Contrast  Result Date: 10/29/2018 CLINICAL DATA:  85 year old female with history of left-sided lower back pain. Recent history of fall onto the right side. EXAM: CT ABDOMEN AND PELVIS WITHOUT CONTRAST TECHNIQUE: Multidetector CT imaging of the abdomen and pelvis was performed following the standard protocol without IV contrast. COMPARISON:  No priors. FINDINGS: Lower chest: Mild scarring in the lung bases. Atherosclerotic calcifications in the descending thoracic aorta and the right coronary artery. Hepatobiliary: No definite suspicious cystic or solid hepatic lesions are confidently identified on today's noncontrast CT examination. Unenhanced appearance of the gallbladder is normal. Pancreas: No definite pancreatic mass or peripancreatic fluid or inflammatory changes are noted on today's noncontrast CT examination. Spleen: Unremarkable. Adrenals/Urinary Tract: Unenhanced appearance of the kidneys and bilateral adrenal glands is normal. No hydroureteronephrosis. Urinary bladder is normal in appearance. Stomach/Bowel: Unenhanced appearance of the stomach is normal. No pathologic dilatation of small bowel or colon. Numerous colonic diverticulae are noted, without surrounding inflammatory changes to suggest an acute diverticulitis at this time. The appendix is not confidently identified and may be surgically absent. Regardless, there are no inflammatory changes noted adjacent to the cecum to suggest the presence of an acute appendicitis at this time. Vascular/Lymphatic: Extensive atherosclerotic calcifications throughout the abdominal aorta and pelvic vasculature. No lymphadenopathy noted in the abdomen or pelvis. Reproductive: Uterus and ovaries are atrophic. Other: No significant volume of ascites.   No pneumoperitoneum. Musculoskeletal: Acute  nondisplaced fracture of the left sacral ala. Chronic compression fracture of L2 vertebral body with 60% loss of central vertebral body height and post vertebroplasty changes. Nondisplaced fractures and ill-defined areas of sclerosis in the parasymphyseal region of the right pubic bone and in the right sacral ala. There are no aggressive appearing lytic or blastic lesions noted in the visualized portions of the skeleton. IMPRESSION: 1. Acute nondisplaced fracture of the left sacral ala. Healing nondisplaced fractures of the parasymphyseal region of the right pubic bone and right sacral ala. 2. No other acute findings are noted in the abdomen or pelvis to account for the patient's symptoms. 3. Chronic compression fracture of L2 vertebral body with post vertebroplasty changes, similar to the prior study, as above. 4. Colonic diverticulosis without evidence of acute diverticulitis at this time. 5. Aortic atherosclerosis, in addition to at least right coronary artery disease. 6. Additional incidental findings, as above. Electronically Signed   By: Vinnie Langton M.D.   On: 10/29/2018 20:05   Ct L-spine No Charge  Result Date: 10/29/2018 CLINICAL DATA:  85 year old female status post fall at home with low back and left side pain. EXAM: CT LUMBAR SPINE WITHOUT CONTRAST TECHNIQUE: Technique: Multiplanar CT images of the lumbar spine were reconstructed from contemporary CT of the Abdomen and Pelvis. CONTRAST:  None. COMPARISON:  CT Abdomen and Pelvis today reported separately. Lumbar spine CT 01/30/2018. FINDINGS: Segmentation: Normal, the same numbering system on the 2019 CT. Alignment: Stable lumbar and lower thoracic alignment since 2019. New ventral impaction and anterior angulation of the central sacrum at S1-S2 (sagittal image 47). See additional details below. Vertebrae: Acute nondisplaced left sacral ala fracture best demonstrated on coronal image 34. Acute on  chronic right sacral ala fracture (same image) is also nondisplaced. Mild impaction of the ventral S1-S2 cortex. The entire sacrum is not included on these images. Chronic and previously augmented L2 compression fracture is stable since 2019. No acute osseous abnormality identified in the lower thoracic spine. Likewise, the L1 through L4 levels appears stable and intact. There is a nondisplaced left L5 transverse process fracture which appears acute and is best seen on coronal image 38. The L5 vertebra otherwise appears intact. Paraspinal and other soft tissues: Reported with the CT Abdomen and Pelvis today separately. Disc levels: Stable since 2019, mild for age lumbar spine degeneration. IMPRESSION: 1. Acute nondisplaced sacral fractures affecting the bilateral ala (right side is acute on chronic) and the central S1-S2 bodies ventrally. 2. Acute nondisplaced left L5 transverse process fracture (stable injury). 3. No other acute osseous abnormality in the lumbar spine. Chronic and previously augmented L2 compression fracture. 4.  CT Abdomen and Pelvis today are reported separately. Electronically Signed   By: Genevie Ann M.D.   On: 10/29/2018 20:06   Dg Femur Min 2 Views Left  Result Date: 10/29/2018 CLINICAL DATA:  Bilateral femoral pain after a fall today. Legs gave out. EXAM: LEFT FEMUR 2 VIEWS COMPARISON:  None. FINDINGS: Mild degenerative changes in the hip and knee. No evidence of acute fracture or dislocation of the left femur. No focal bone lesion or bone destruction. Bone cortex appears intact. Vascular calcifications. IMPRESSION: No acute bony abnormalities. Electronically Signed   By: Lucienne Capers M.D.   On: 10/29/2018 20:16   Dg Femur Min 2 Views Right  Result Date: 10/29/2018 CLINICAL DATA:  Bilateral femoral pain after a fall today. Legs gave out. EXAM: RIGHT FEMUR 2 VIEWS COMPARISON:  None. FINDINGS: Moderate degenerative changes demonstrated in the  right hip with joint space narrowing and  osteophyte formation on both sides of the joint. No evidence of acute fracture or dislocation. No focal bone lesion or bone destruction. Bone cortex appears intact. Vascular calcifications. IMPRESSION: Degenerative changes in the right hip. No acute bony abnormalities. Electronically Signed   By: Lucienne Capers M.D.   On: 10/29/2018 20:17    Procedures Procedures (including critical care time)  Medications Ordered in ED Medications  morphine 2 MG/ML injection 1 mg (has no administration in time range)  acetaminophen (TYLENOL) tablet 650 mg (has no administration in time range)  amLODipine (NORVASC) tablet 5 mg (has no administration in time range)  meclizine (ANTIVERT) tablet 25 mg (has no administration in time range)  enoxaparin (LOVENOX) injection 30 mg (has no administration in time range)  acetaminophen (TYLENOL) tablet 1,000 mg (1,000 mg Oral Given 10/29/18 2105)  oxyCODONE (Oxy IR/ROXICODONE) immediate release tablet 2.5 mg (2.5 mg Oral Given 10/29/18 2105)  potassium chloride SA (K-DUR,KLOR-CON) CR tablet 40 mEq (40 mEq Oral Given 10/29/18 2356)     Initial Impression / Assessment and Plan / ED Course  I have reviewed the triage vital signs and the nursing notes.  Pertinent labs & imaging results that were available during my care of the patient were reviewed by me and considered in my medical decision making (see chart for details).      MDM:  Imaging: CT abdomen pelvis and lumbar spine show acute nondisplaced sacral fractures of the bilateral sacral alla and central S1-S2 bodies.  Nondisplaced left L5 transverse process fracture.  Compression fracture at L2 similar to prior studies.  No other acute findings.  X-ray of the left and right femur showed degenerative changes without acute pathology.  ED Provider Interpretation of EKG: None indicated at this time.   Labs: CMP with CO2 19 otherwise at baseline, CBC unremarkable, UA largely unremarkable  On arrival, patient appears  well ill. Afebrile and hemodynamically stable. Alert and oriented x4, pleasant, and cooperative.  He presents with back pain and hip pain as detailed above.  On exam, patient has no focal neurologic deficits.  Does appear uncomfortable.  Given p.o. oxycodone and acetaminophen for pain as well as p.o. Robaxin for spasm.  Some improvement thereafter.  CT abdomen pelvis with lumbar spine without contrast performed showing sacral fractures.  Otherwise the patient is neurovascularly intact and x-ray of the femurs bilaterally is without acute pathology.  She has no headache, upper back pain, or neck pain.  No neurologic deficits.  No indication at this time for CT head or C-spine.  Remainder of her evaluation is largely unremarkable.  The patient endorses questionable urinary retention.  Bladder scan pending.  However, CT abdomen pelvis with lumbar spine does not show any acute significant pathology in the spine that raises suspicion for cauda equina syndrome.  Additionally, patient does not endorse changes in her stool patterns or perianal or saddle anesthesias.  No radicular type pain.  Overall suspect pain is secondary to sacral fractures.  Additionally, patient is having persistent functional decline at home and is not able to tolerate her pain with limited ability to ambulate at home.  Feel that she is most appropriate for admission.  Spoke to orthopedic surgery on call who recommended weightbearing as tolerated and will see the patient tomorrow for sacral fractures.  She was admitted to the hospitalist service for further management.  The plan for this patient was discussed with Dr. Tyrone Nine who voiced agreement and who oversaw evaluation  and treatment of this patient.   The patient was fully informed and involved with the history taking, evaluation, workup including labs/images, and plan. The patient's concerns and questions were addressed to the patient's satisfaction and she expressed agreement with the  plan to admit.    Final Clinical Impressions(s) / ED Diagnoses   Final diagnoses:  Blunt abdominal trauma    ED Discharge Orders    None       Lizandro Spellman, Rodena Goldmann, MD 10/30/18 0011    Deno Etienne, DO 10/30/18 1524

## 2018-10-29 NOTE — ED Triage Notes (Signed)
Pt here after having MRI done last week for left hip pain. Bilateral hip pain when standing and left hip pain all the time.

## 2018-10-29 NOTE — ED Notes (Signed)
Attempted report x1. 

## 2018-10-29 NOTE — H&P (Addendum)
History and Physical    Belinda Webb UEA:[id number] DOB: 02/07/1925 DOA: 10/29/2018  PCP: Terald Sleeper, PA-C Patient coming from: Home  Chief Complaint: Fall, lower back pain, left hip pain  HPI: Belinda Webb is a 85 y.o. female with medical history significant of hypertension, hyperlipidemia, CVA presenting to the hospital for evaluation of lower back pain and left hip pain after a recent fall.  Patient has chronic difficulty hearing and it was difficult to obtain a thorough history from her.  Reporting falling a few days ago and since then has been having excruciating left-sided lower back pain and left hip pain.  States she fell because she was walking and her legs gave out.  Does not report any head injury from the fall.  Denies having any chest pain, dizziness, or shortness of breath prior to the fall.  States she was able to crawl to another bedroom as her legs were very weak and she could not get up.  Per sons at bedside, patient fell 3 days ago.  She lives independently and a home health nurse checks on her daily.  Family is currently in the process of getting the patient to a full-time nursing facility.  Sons mention that the patient fell in December 2019 as well and had injured her right hip at that time.  States she went to Dr. Nelva Bush from Ortho who did an MRI and they were told that she did not have a hip fracture.  Sons state that patient's p.o. intake is good.  Review of Systems: As per HPI otherwise 10 point review of systems negative.  Past Medical History:  Diagnosis Date  . Chronic cough   . HLD (hyperlipidemia)   . HTN (hypertension)   . Lung tumor 1984   [facility]  . Stroke ([place])   . Vertigo     Past Surgical History:  Procedure Laterality Date  . APPENDECTOMY  1938  . BACK SURGERY    . BREAST BIOPSY     left  . CATARACT EXTRACTION, BILATERAL    . LUNG SURGERY  1984   [facility]   . TONSILLECTOMY AND ADENOIDECTOMY    . TOTAL ABDOMINAL HYSTERECTOMY  1947     reports  that she quit smoking about 36 years ago. Her smoking use included cigarettes. She has a 20.00 pack-year smoking history. She has never used smokeless tobacco. She reports that she does not drink alcohol or use drugs.  Allergies  Allergen Reactions  . Lipitor [Atorvastatin Calcium] Other (See Comments)    Reaction unknown  . Nitrofurantoin Monohyd Macro Other (See Comments)    Reaction unknown  . Penicillins Other (See Comments)    Has patient had a PCN reaction causing immediate rash, facial/tongue/throat swelling, SOB or lightheadedness with hypotension yes Has patient had a PCN reaction causing severe rash involving mucus membranes or skin necrosis: no Has patient had a PCN reaction that required hospitalization: No Has patient had a PCN reaction occurring within the last 10 years: No If all of the above answers are "NO", then may proceed with Cephalosporin use.   Marland Kitchen Plavix [Clopidogrel Bisulfate] Other (See Comments)    Reaction unknown  . Statins Other (See Comments)    REACTION: effects muscles  . Sulfa Antibiotics Other (See Comments)    Reaction unknown    Family History  Problem Relation Age of Onset  . Diabetes Father   . Hypertension Father   . Hearing loss Other   . Stroke Mother   .  Tuberculosis Sister   . Emphysema Brother   . Lung cancer Brother   . Colon polyps Son   . Colon cancer Neg Hx     Prior to Admission medications   Medication Sig Start Date End Date Taking? Authorizing Provider  amLODipine (NORVASC) 5 MG tablet TAKE 1 TABLET BY MOUTH EVERY DAY Patient taking differently: Take 5 mg by mouth daily.  09/05/18  Yes Terald Sleeper, PA-C  meclizine (ANTIVERT) 25 MG tablet Take 25 mg by mouth daily as needed for dizziness (vertigo).   Yes [provider]  oxyCODONE (OXY IR/ROXICODONE) 5 MG immediate release tablet Take 5 mg by mouth 3 (three) times daily.   Yes [provider]  triamterene-hydrochlorothiazide (MAXZIDE-25) 37.5-25 MG tablet  Take 1 tablet by mouth daily. Patient not taking: Reported on 10/29/2018 11/09/17   Terald Sleeper, PA-C    Physical Exam: Vitals:   10/29/18 1845 10/29/18 2000 10/29/18 2045 10/29/18 2200  BP: (!) 175/75 (!) 155/89 (!) 158/82 (!) 154/74  Pulse: (!) 58 69 (!) 58 68  Resp:   20 18  Temp:      TempSrc:      SpO2: 100% 100% 99% 98%  Weight:      Height:        Physical Exam  Constitutional: She is oriented to person, place, and time. She appears distressed.  Frail elderly female  Distressed secondary to left lower back/left hip pain  HENT:  Head: Normocephalic.  Mouth/Throat: Oropharynx is clear and moist.  Eyes: Right eye exhibits no discharge. Left eye exhibits no discharge.  Neck: Neck supple.  Cardiovascular: Normal rate, regular rhythm and intact distal pulses.  Pulmonary/Chest: Effort normal and breath sounds normal. No respiratory distress. She has no wheezes. She has no rales.  Abdominal: Soft. Bowel sounds are normal. She exhibits no distension. There is no abdominal tenderness.  Musculoskeletal:        General: No edema.  Neurological: She is alert and oriented to person, place, and time.  Strength 5 out of 5 in bilateral upper and lower extremities.  Sensation to light touch intact throughout.  Speech fluent.  Tongue midline.  No facial droop.  Skin: Skin is warm and dry. She is not diaphoretic.     Labs on Admission: I have personally reviewed following labs and imaging studies  CBC: Recent Labs  Lab 10/29/18 2029  WBC 9.4  NEUTROABS 5.3  HGB 13.6  HCT 42.7  MCV 98.4  PLT 076   Basic Metabolic Panel: Recent Labs  Lab 10/29/18 2029  NA 137  K 3.4*  CL 106  CO2 19*  GLUCOSE 98  BUN 20  CREATININE 1.09*  CALCIUM 8.6*   GFR: Estimated Creatinine Clearance: 24.1 mL/min (A) (by C-G formula based on SCr of 1.09 mg/dL (H)). Liver Function Tests: Recent Labs  Lab 10/29/18 2029  AST 20  ALT 10  ALKPHOS 237*  BILITOT 1.2  PROT 6.6  ALBUMIN 3.4*    Recent Labs  Lab 10/29/18 2029  LIPASE 33   No results for input(s): AMMONIA in the last 168 hours. Coagulation Profile: No results for input(s): INR, PROTIME in the last 168 hours. Cardiac Enzymes: No results for input(s): CKTOTAL, CKMB, CKMBINDEX, TROPONINI in the last 168 hours. BNP (last 3 results) No results for input(s): PROBNP in the last 8760 hours. HbA1C: No results for input(s): HGBA1C in the last 72 hours. CBG: No results for input(s): GLUCAP in the last 168 hours. Lipid Profile: No  results for input(s): CHOL, HDL, LDLCALC, TRIG, CHOLHDL, LDLDIRECT in the last 72 hours. Thyroid Function Tests: No results for input(s): TSH, T4TOTAL, FREET4, T3FREE, THYROIDAB in the last 72 hours. Anemia Panel: No results for input(s): VITAMINB12, FOLATE, FERRITIN, TIBC, IRON, RETICCTPCT in the last 72 hours. Urine analysis:    Component Value Date/Time   COLORURINE YELLOW 10/29/2018 2109   APPEARANCEUR CLEAR 10/29/2018 2109   LABSPEC 1.011 10/29/2018 2109   PHURINE 8.0 10/29/2018 2109   GLUCOSEU NEGATIVE 10/29/2018 2109   HGBUR NEGATIVE 10/29/2018 2109   BILIRUBINUR NEGATIVE 10/29/2018 2109   KETONESUR 20 (A) 10/29/2018 2109   PROTEINUR NEGATIVE 10/29/2018 2109   UROBILINOGEN 0.2 04/15/2012 1757   NITRITE NEGATIVE 10/29/2018 2109   LEUKOCYTESUR NEGATIVE 10/29/2018 2109    Radiological Exams on Admission: Ct Abdomen Pelvis Wo Contrast  Result Date: 10/29/2018 CLINICAL DATA:  85 year old female with history of left-sided lower back pain. Recent history of fall onto the right side. EXAM: CT ABDOMEN AND PELVIS WITHOUT CONTRAST TECHNIQUE: Multidetector CT imaging of the abdomen and pelvis was performed following the standard protocol without IV contrast. COMPARISON:  No priors. FINDINGS: Lower chest: Mild scarring in the lung bases. Atherosclerotic calcifications in the descending thoracic aorta and the right coronary artery. Hepatobiliary: No definite suspicious cystic or solid  hepatic lesions are confidently identified on today's noncontrast CT examination. Unenhanced appearance of the gallbladder is normal. Pancreas: No definite pancreatic mass or peripancreatic fluid or inflammatory changes are noted on today's noncontrast CT examination. Spleen: Unremarkable. Adrenals/Urinary Tract: Unenhanced appearance of the kidneys and bilateral adrenal glands is normal. No hydroureteronephrosis. Urinary bladder is normal in appearance. Stomach/Bowel: Unenhanced appearance of the stomach is normal. No pathologic dilatation of small bowel or colon. Numerous colonic diverticulae are noted, without surrounding inflammatory changes to suggest an acute diverticulitis at this time. The appendix is not confidently identified and may be surgically absent. Regardless, there are no inflammatory changes noted adjacent to the cecum to suggest the presence of an acute appendicitis at this time. Vascular/Lymphatic: Extensive atherosclerotic calcifications throughout the abdominal aorta and pelvic vasculature. No lymphadenopathy noted in the abdomen or pelvis. Reproductive: Uterus and ovaries are atrophic. Other: No significant volume of ascites.  No pneumoperitoneum. Musculoskeletal: Acute nondisplaced fracture of the left sacral ala. Chronic compression fracture of L2 vertebral body with 60% loss of central vertebral body height and post vertebroplasty changes. Nondisplaced fractures and ill-defined areas of sclerosis in the parasymphyseal region of the right pubic bone and in the right sacral ala. There are no aggressive appearing lytic or blastic lesions noted in the visualized portions of the skeleton. IMPRESSION: 1. Acute nondisplaced fracture of the left sacral ala. Healing nondisplaced fractures of the parasymphyseal region of the right pubic bone and right sacral ala. 2. No other acute findings are noted in the abdomen or pelvis to account for the patient's symptoms. 3. Chronic compression fracture of L2  vertebral body with post vertebroplasty changes, similar to the prior study, as above. 4. Colonic diverticulosis without evidence of acute diverticulitis at this time. 5. Aortic atherosclerosis, in addition to at least right coronary artery disease. 6. Additional incidental findings, as above. Electronically Signed   By: Vinnie Langton M.D.   On: 10/29/2018 20:05   Ct L-spine No Charge  Result Date: 10/29/2018 CLINICAL DATA:  85 year old female status post fall at home with low back and left side pain. EXAM: CT LUMBAR SPINE WITHOUT CONTRAST TECHNIQUE: Technique: Multiplanar CT images of the lumbar spine were reconstructed from  contemporary CT of the Abdomen and Pelvis. CONTRAST:  None. COMPARISON:  CT Abdomen and Pelvis today reported separately. Lumbar spine CT 01/30/2018. FINDINGS: Segmentation: Normal, the same numbering system on the 2019 CT. Alignment: Stable lumbar and lower thoracic alignment since 2019. New ventral impaction and anterior angulation of the central sacrum at S1-S2 (sagittal image 47). See additional details below. Vertebrae: Acute nondisplaced left sacral ala fracture best demonstrated on coronal image 34. Acute on chronic right sacral ala fracture (same image) is also nondisplaced. Mild impaction of the ventral S1-S2 cortex. The entire sacrum is not included on these images. Chronic and previously augmented L2 compression fracture is stable since 2019. No acute osseous abnormality identified in the lower thoracic spine. Likewise, the L1 through L4 levels appears stable and intact. There is a nondisplaced left L5 transverse process fracture which appears acute and is best seen on coronal image 38. The L5 vertebra otherwise appears intact. Paraspinal and other soft tissues: Reported with the CT Abdomen and Pelvis today separately. Disc levels: Stable since 2019, mild for age lumbar spine degeneration. IMPRESSION: 1. Acute nondisplaced sacral fractures affecting the bilateral ala (right  side is acute on chronic) and the central S1-S2 bodies ventrally. 2. Acute nondisplaced left L5 transverse process fracture (stable injury). 3. No other acute osseous abnormality in the lumbar spine. Chronic and previously augmented L2 compression fracture. 4.  CT Abdomen and Pelvis today are reported separately. Electronically Signed   By: Genevie Ann M.D.   On: 10/29/2018 20:06   Dg Femur Min 2 Views Left  Result Date: 10/29/2018 CLINICAL DATA:  Bilateral femoral pain after a fall today. Legs gave out. EXAM: LEFT FEMUR 2 VIEWS COMPARISON:  None. FINDINGS: Mild degenerative changes in the hip and knee. No evidence of acute fracture or dislocation of the left femur. No focal bone lesion or bone destruction. Bone cortex appears intact. Vascular calcifications. IMPRESSION: No acute bony abnormalities. Electronically Signed   By: Lucienne Capers M.D.   On: 10/29/2018 20:16   Dg Femur Min 2 Views Right  Result Date: 10/29/2018 CLINICAL DATA:  Bilateral femoral pain after a fall today. Legs gave out. EXAM: RIGHT FEMUR 2 VIEWS COMPARISON:  None. FINDINGS: Moderate degenerative changes demonstrated in the right hip with joint space narrowing and osteophyte formation on both sides of the joint. No evidence of acute fracture or dislocation. No focal bone lesion or bone destruction. Bone cortex appears intact. Vascular calcifications. IMPRESSION: Degenerative changes in the right hip. No acute bony abnormalities. Electronically Signed   By: Lucienne Capers M.D.   On: 10/29/2018 20:17    Assessment/Plan Principal Problem:   Hip pain Active Problems:   Fall   HTN (hypertension)   Sacral fracture (HCC)   Lumbar vertebral fracture (HCC)   Lower back pain   Hypokalemia   Normal anion gap metabolic acidosis   Elevated alkaline phosphatase level   CKD (chronic kidney disease) stage 3, GFR 30-59 ml/min (HCC)   Left-sided lower back pain and left hip pain secondary to sacral and lumbar vertebral fractures from  recent mechanical fall CT lumbar spine showing acute nondisplaced sacral fractures affecting the bilateral ala (right side is acute on chronic) in the central S1-S2 bodies ventrally.  Acute nondisplaced left L5 transverse process fracture (stable injury). CT abdomen pelvis showing acute nondisplaced fracture of the left sacral ala.  Also showing healing nondisplaced fractures of the parasymphyseal region of the right pubic bone and right sacral ala.  There is a chronic compression  fracture of the L2 vertebral body with post or vertebroplasty changes.  X-ray of right femur showing degenerative changes in the right hip; no acute abnormalities.  CT of left femur showing no acute bony abnormalities. -ED provider informed me that they spoke to Dr. Marlou Sa from orthopedics, patient will be seen in the morning. -Pain management: Morphine 1 mg every 4 hours as needed for severe pain, Tylenol as needed -PT/OT evaluation  Recent fall Seems to be mechanical based on history provided by the patient.  Denies having any chest pain, dizziness, or shortness of breath prior to the fall.  She was able to crawl to another bedroom after falling.  Patient is afebrile and does not have leukocytosis.  UA not suggestive of infection. -PT/OT evaluation  Acute urinary retention She has not voided since she has been in the hospital. 435 ml noted on bladder scan.  I was informed by nursing staff that patient wanted to void on her own.  Later informed that she was unable to do so and instructions were given to do in and out cath.  Nursing staff has informed me that they have tried doing in and out cath but have had several unsuccessful attempts.  CT abdomen pelvis showing no evidence of hydroureteronephrosis and normal urinary bladder. -Coud catheter -Monitor urine output  Mild hypokalemia Potassium 3.4. -Replete potassium.  Check magnesium level.  Repeat BMP in a.m.  Normal anion gap metabolic acidosis Bicarb 19, anion gap  12.  Patient is not complaining of diarrhea. -Repeat BMP in a.m.  Elevated alkaline phosphatase Alk phos 237, remainder of LFTs normal.  No hepatobiliary abnormality reported on CT abdomen pelvis. -Check GGT level  CKD 3 -Stable.  Creatinine 1.0.  Hypertension Systolic currently in 320Q. -Continue home amlodipine  DVT prophylaxis: Lovenox (renally dosed) Code Status: DNR.  Discussed with patient and sons at bedside. Family Communication: Sons at bedside. Disposition Plan: Anticipate discharge in 1 to 2 days. Consults called: Orthopedics Admission status: Observation   Shela Leff MD [facility] Pager [phone]  If 7PM-7AM, please contact night-coverage [email or web address] Password [email or web address]  10/29/2018, 11:18 PM

## 2018-10-29 NOTE — ED Notes (Signed)
Pt in CT.

## 2018-10-29 NOTE — Progress Notes (Addendum)
Patient arrived to unit via stretcher. Patient oriented to alert and oriented x3.  Bladder scan done. 435 ml noted. Dr Marlowe Sax made aware. Patient encouraged to void on her own. MD orders reviewed with patient's sons Octavia Bruckner and Ron. Admission assessment done with patient sons as patient is hard of hearing. Call bell within reach, bed alarm activated. Will close monitor patient.

## 2018-10-29 NOTE — ED Notes (Signed)
Admitting at bedside 

## 2018-10-30 DIAGNOSIS — M25551 Pain in right hip: Secondary | ICD-10-CM | POA: Diagnosis not present

## 2018-10-30 DIAGNOSIS — N183 Chronic kidney disease, stage 3 (moderate): Secondary | ICD-10-CM

## 2018-10-30 DIAGNOSIS — M25552 Pain in left hip: Secondary | ICD-10-CM | POA: Diagnosis not present

## 2018-10-30 LAB — BASIC METABOLIC PANEL
Anion gap: 8 (ref 5–15)
BUN: 17 mg/dL (ref 8–23)
CHLORIDE: 106 mmol/L (ref 98–111)
CO2: 20 mmol/L — ABNORMAL LOW (ref 22–32)
Calcium: 7.8 mg/dL — ABNORMAL LOW (ref 8.9–10.3)
Creatinine, Ser: 0.95 mg/dL (ref 0.44–1.00)
GFR calc Af Amer: >60 mL/min (ref >60)
GFR calc non Af Amer: 52 mL/min — ABNORMAL LOW (ref >60)
Glucose, Bld: 100 mg/dL — ABNORMAL HIGH (ref 70–99)
Potassium: 3.7 mmol/L (ref 3.5–5.1)
Sodium: 134 mmol/L — ABNORMAL LOW (ref 135–145)

## 2018-10-30 LAB — GAMMA GT: GGT: 14 U/L (ref 7–50)

## 2018-10-30 LAB — MAGNESIUM: Magnesium: 2 mg/dL (ref 1.7–2.4)

## 2018-10-30 MED ORDER — LIDOCAINE HCL URETHRAL/MUCOSAL 2 % EX GEL
1.0000 "application " | CUTANEOUS | Status: AC
  Filled 2018-10-30: qty 5
  Filled 2018-10-30: qty 20

## 2018-10-30 NOTE — Progress Notes (Signed)
PROGRESS NOTE    Belinda Webb  ZOX:[id number] DOB: Dec 13, 1924 DOA: 10/29/2018 PCP: Theodoro Clock   Brief Narrative: 85 year old female with history of hypertension, hyperlipidemia, CVA admitted from home with fall 3 days PTA admission.  She has been having recent falls, back in December had injured her right hip, was seen by her orthopedic and had unremarkable MRI reportedly.  In the ER CT L SPINE showed sacral and lumbar vertebral fractures, also had urine retention.  Patient was admitted for pain management PT OT evaluation.  Orthopedic Dr. Marlou Sa was consulted in the ER.  CT L SPINE reported as "Acute nondisplaced sacral fractures affecting the bilateral ala (right side is acute on chronic) and the central S1-S2 bodies ventrally. 2. Acute nondisplaced left L5 transverse process fracture (stable injury). 3. No other acute osseous abnormality in the lumbar spine. Chronic and previously augmented L2 compression fracture"  Assessment & Plan:  Low back pain/hip pain secondary to sacral and lumbar vertebral fractures from a recent mechanical fall. Ct lumbar Spine with acute nondisplaced sacral fractures of bilateral ala right-sided acute on chronic, central S1-S2 bodies  ventrally, acute nondisplaced left L5 transverse process fracture(stable injury), chronic and previously augmented L2 compression fracture.  Continue on oral and IV opiates.she is on iv  Morphine for pain control.  PT OT consult appreciated, will need skilled nursing facility placement.  Orthopedic has been consulted and awaiting inputs.  FallS recent at home, continue supportive care, PT OT.  Will need placement.  Acute urinary retention, patient has external catheter in place.  CT abdomen pelvis showed no evidence of hydroureteronephrosis  HTN : Fairly stable, intermittently systolic bp high likely from pain continue amlodipine. Optimize pain meds.  Hypokalemia resolved.  Normal anion gap metabolic acidosis:  Creatinine stable bicarb improving.  Continue oral hydration.  Elevated alkaline phosphatase, otherwise normal LFTs.  No hepatobiliary abnormality in the CT scan. GGT nl. Repeat lfts in am.  CKD stage 3: Creatinine stable.  Monitor.   DVT prophylaxis: Lovenox Code Status: DNR Family Communication: No family at bedside. Disposition Plan: Skilled nursing facility in 1 to 2 days, pending orthopedic eval.   Consultants: Orthopedic  Procedures: None  Antimicrobials: None  Subjective: Patient seen this morning.  She had physical therapy eval this morning and currently resting in the bedside chair.  She reports she has ongoing pain worse with activity.  She denies any nausea/vomiting/chest, fever or chills.  Objective: Vitals:   10/29/18 2200 10/29/18 2325 10/30/18 0000 10/30/18 0405  BP: (!) 154/74 (!) 153/76  (!) 158/72  Pulse: 68 (!) 57  (!) 57  Resp: 18 16  14   Temp:   (!) 97.5 F (36.4 C) (!) 97.4 F (36.3 C)  TempSrc:   Oral Oral  SpO2: 98% 100%  97%  Weight:  44.7 kg    Height:        Intake/Output Summary (Last 24 hours) at 10/30/2018 1123 Last data filed at 10/30/2018 0900 Gross per 24 hour  Intake 120 ml  Output -  Net 120 ml   Filed Weights   10/29/18 1844 10/29/18 2325  Weight: 49.9 kg 44.7 kg   Weight change:   Body mass index is 19.25 kg/m.  Examination:  General exam: Appears calm and comfortable, elderly, frail,Not in distress,average built HEENT:PERRL,Oral mucosa moist, Ear/Nose normal on gross exam Respiratory system: Bilateral equal air entry, normal vesicular breath sounds, no wheezes or crackles  Cardiovascular system: S1 & S2 heard,No JVD, murmurs.No pedal edema.  Gastrointestinal system: Abdomen is  soft, non tender, non distended, BS +  Nervous System:Alert and oriented. No focal neurological deficits/moving extremities upper and lower well. Extremities: No edema, no clubbing,distal peripheral pulses palpable.  Tenderness in her bilateral back  hip area Skin: No rashes, lesions,no icterus MSK: Normal muscle bulk,tone ,power  Medications:  Scheduled Meds: . amLODipine  5 mg Oral Daily  . enoxaparin (LOVENOX) injection  30 mg Subcutaneous Q24H  . lidocaine  1 application Urethral STAT   Continuous Infusions:  Data Reviewed: I have personally reviewed following labs and imaging studies  CBC: Recent Labs  Lab 10/29/18 2029  WBC 9.4  NEUTROABS 5.3  HGB 13.6  HCT 42.7  MCV 98.4  PLT 277   Basic Metabolic Panel: Recent Labs  Lab 10/29/18 2029 10/30/18 0049  NA 137 134*  K 3.4* 3.7  CL 106 106  CO2 19* 20*  GLUCOSE 98 100*  BUN 20 17  CREATININE 1.09* 0.95  CALCIUM 8.6* 7.8*  MG  --  2.0   GFR: Estimated Creatinine Clearance: 27.2 mL/min (by C-G formula based on SCr of 0.95 mg/dL). Liver Function Tests: Recent Labs  Lab 10/29/18 2029  AST 20  ALT 10  ALKPHOS 237*  BILITOT 1.2  PROT 6.6  ALBUMIN 3.4*   Recent Labs  Lab 10/29/18 2029  LIPASE 33   No results for input(s): AMMONIA in the last 168 hours. Coagulation Profile: No results for input(s): INR, PROTIME in the last 168 hours. Cardiac Enzymes: No results for input(s): CKTOTAL, CKMB, CKMBINDEX, TROPONINI in the last 168 hours. BNP (last 3 results) No results for input(s): PROBNP in the last 8760 hours. HbA1C: No results for input(s): HGBA1C in the last 72 hours. CBG: No results for input(s): GLUCAP in the last 168 hours. Lipid Profile: No results for input(s): CHOL, HDL, LDLCALC, TRIG, CHOLHDL, LDLDIRECT in the last 72 hours. Thyroid Function Tests: No results for input(s): TSH, T4TOTAL, FREET4, T3FREE, THYROIDAB in the last 72 hours. Anemia Panel: No results for input(s): VITAMINB12, FOLATE, FERRITIN, TIBC, IRON, RETICCTPCT in the last 72 hours. Sepsis Labs: No results for input(s): PROCALCITON, LATICACIDVEN in the last 168 hours.  No results found for this or any previous visit (from the past 240 hour(s)).    Radiology  Studies: Ct Abdomen Pelvis Wo Contrast  Result Date: 10/29/2018 CLINICAL DATA:  85 year old female with history of left-sided lower back pain. Recent history of fall onto the right side. EXAM: CT ABDOMEN AND PELVIS WITHOUT CONTRAST TECHNIQUE: Multidetector CT imaging of the abdomen and pelvis was performed following the standard protocol without IV contrast. COMPARISON:  No priors. FINDINGS: Lower chest: Mild scarring in the lung bases. Atherosclerotic calcifications in the descending thoracic aorta and the right coronary artery. Hepatobiliary: No definite suspicious cystic or solid hepatic lesions are confidently identified on today's noncontrast CT examination. Unenhanced appearance of the gallbladder is normal. Pancreas: No definite pancreatic mass or peripancreatic fluid or inflammatory changes are noted on today's noncontrast CT examination. Spleen: Unremarkable. Adrenals/Urinary Tract: Unenhanced appearance of the kidneys and bilateral adrenal glands is normal. No hydroureteronephrosis. Urinary bladder is normal in appearance. Stomach/Bowel: Unenhanced appearance of the stomach is normal. No pathologic dilatation of small bowel or colon. Numerous colonic diverticulae are noted, without surrounding inflammatory changes to suggest an acute diverticulitis at this time. The appendix is not confidently identified and may be surgically absent. Regardless, there are no inflammatory changes noted adjacent to the cecum to suggest the presence of an acute appendicitis at  this time. Vascular/Lymphatic: Extensive atherosclerotic calcifications throughout the abdominal aorta and pelvic vasculature. No lymphadenopathy noted in the abdomen or pelvis. Reproductive: Uterus and ovaries are atrophic. Other: No significant volume of ascites.  No pneumoperitoneum. Musculoskeletal: Acute nondisplaced fracture of the left sacral ala. Chronic compression fracture of L2 vertebral body with 60% loss of central vertebral body height  and post vertebroplasty changes. Nondisplaced fractures and ill-defined areas of sclerosis in the parasymphyseal region of the right pubic bone and in the right sacral ala. There are no aggressive appearing lytic or blastic lesions noted in the visualized portions of the skeleton. IMPRESSION: 1. Acute nondisplaced fracture of the left sacral ala. Healing nondisplaced fractures of the parasymphyseal region of the right pubic bone and right sacral ala. 2. No other acute findings are noted in the abdomen or pelvis to account for the patient's symptoms. 3. Chronic compression fracture of L2 vertebral body with post vertebroplasty changes, similar to the prior study, as above. 4. Colonic diverticulosis without evidence of acute diverticulitis at this time. 5. Aortic atherosclerosis, in addition to at least right coronary artery disease. 6. Additional incidental findings, as above. Electronically Signed   By: Vinnie Langton M.D.   On: 10/29/2018 20:05   Ct L-spine No Charge  Result Date: 10/29/2018 CLINICAL DATA:  85 year old female status post fall at home with low back and left side pain. EXAM: CT LUMBAR SPINE WITHOUT CONTRAST TECHNIQUE: Technique: Multiplanar CT images of the lumbar spine were reconstructed from contemporary CT of the Abdomen and Pelvis. CONTRAST:  None. COMPARISON:  CT Abdomen and Pelvis today reported separately. Lumbar spine CT 01/30/2018. FINDINGS: Segmentation: Normal, the same numbering system on the 2019 CT. Alignment: Stable lumbar and lower thoracic alignment since 2019. New ventral impaction and anterior angulation of the central sacrum at S1-S2 (sagittal image 47). See additional details below. Vertebrae: Acute nondisplaced left sacral ala fracture best demonstrated on coronal image 34. Acute on chronic right sacral ala fracture (same image) is also nondisplaced. Mild impaction of the ventral S1-S2 cortex. The entire sacrum is not included on these images. Chronic and previously  augmented L2 compression fracture is stable since 2019. No acute osseous abnormality identified in the lower thoracic spine. Likewise, the L1 through L4 levels appears stable and intact. There is a nondisplaced left L5 transverse process fracture which appears acute and is best seen on coronal image 38. The L5 vertebra otherwise appears intact. Paraspinal and other soft tissues: Reported with the CT Abdomen and Pelvis today separately. Disc levels: Stable since 2019, mild for age lumbar spine degeneration. IMPRESSION: 1. Acute nondisplaced sacral fractures affecting the bilateral ala (right side is acute on chronic) and the central S1-S2 bodies ventrally. 2. Acute nondisplaced left L5 transverse process fracture (stable injury). 3. No other acute osseous abnormality in the lumbar spine. Chronic and previously augmented L2 compression fracture. 4.  CT Abdomen and Pelvis today are reported separately. Electronically Signed   By: Genevie Ann M.D.   On: 10/29/2018 20:06   Dg Femur Min 2 Views Left  Result Date: 10/29/2018 CLINICAL DATA:  Bilateral femoral pain after a fall today. Legs gave out. EXAM: LEFT FEMUR 2 VIEWS COMPARISON:  None. FINDINGS: Mild degenerative changes in the hip and knee. No evidence of acute fracture or dislocation of the left femur. No focal bone lesion or bone destruction. Bone cortex appears intact. Vascular calcifications. IMPRESSION: No acute bony abnormalities. Electronically Signed   By: Lucienne Capers M.D.   On: 10/29/2018 20:16  Dg Femur Min 2 Views Right  Result Date: 10/29/2018 CLINICAL DATA:  Bilateral femoral pain after a fall today. Legs gave out. EXAM: RIGHT FEMUR 2 VIEWS COMPARISON:  None. FINDINGS: Moderate degenerative changes demonstrated in the right hip with joint space narrowing and osteophyte formation on both sides of the joint. No evidence of acute fracture or dislocation. No focal bone lesion or bone destruction. Bone cortex appears intact. Vascular calcifications.  IMPRESSION: Degenerative changes in the right hip. No acute bony abnormalities. Electronically Signed   By: Lucienne Capers M.D.   On: 10/29/2018 20:17      LOS: 0 days   Time spent: More than 50% of that time was spent in counseling and/or coordination of care.  Antonieta Pert, MD [facility]  10/30/2018, 11:23 AM

## 2018-10-30 NOTE — Evaluation (Signed)
Physical Therapy Evaluation Patient Details Name: Belinda Webb MRN: [id number] DOB: 03/09/1925 Today's Date: 10/30/2018   History of Present Illness  NICHOL ATOR is a 85 y.o. female with medical history significant of hypertension, hyperlipidemia, CVA presenting to the hospital for evaluation of lower back pain and left hip pain after a recent fall.   Clinical Impression   Pt admitted with above diagnosis. Pt currently with functional limitations due to the deficits listed below (see PT Problem List). Managing independently prior to admission; Presents with significant pain with mobility; decr activity tolerance;  Pt will benefit from skilled PT to increase their independence and safety with mobility to allow discharge to the venue listed below.       Follow Up Recommendations SNF    Equipment Recommendations  3in1 (PT);Other (comment)(may already have)    Recommendations for Other Services       Precautions / Restrictions Precautions Precautions: Fall Restrictions Weight Bearing Restrictions: No      Mobility  Bed Mobility Overal bed mobility: Needs Assistance Bed Mobility: Supine to Sit     Supine to sit: Min assist;HOB elevated     General bed mobility comments: Pt using bed rails and Min A to elevate trunk. Pt able to bring BLEs towards EOB.   Transfers Overall transfer level: Needs assistance Equipment used: Rolling walker (2 wheeled) Transfers: Sit to/from [facility] Sit to Stand: Min assist;+2 safety/equipment Stand pivot transfers: Min guard;+2 safety/equipment       General transfer comment: Min A for power up into standing and stabilize in standing. Min Guard A for safety during pivot and cues to taker her time  Ambulation/Gait             General Gait Details: Did not tolerate weight bearing LLE, so deferred amb this session  Stairs            Wheelchair Mobility    Modified Rankin (Stroke Patients Only)        Balance Overall balance assessment: Needs assistance Sitting-balance support: No upper extremity supported;Feet supported Sitting balance-Leahy Scale: Fair     Standing balance support: Bilateral upper extremity supported;During functional activity Standing balance-Leahy Scale: Poor Standing balance comment: Reliant on UE support                             Pertinent Vitals/Pain Pain Assessment: Faces Faces Pain Scale: Hurts whole lot Pain Location: L proximal LE and low back Pain Descriptors / Indicators: Constant;Discomfort;Grimacing;Moaning Pain Intervention(s): Monitored during session;Limited activity within patient's tolerance;Repositioned    Home Living Family/patient expects to be discharged to:: Skilled nursing facility Living Arrangements: Alone               Additional Comments: RW    Prior Function Level of Independence: Independent with assistive device(s)         Comments: Pt was performing ADLs and using RW for functional mobility. Pt's family comes to assist with IADL and also hires people to assist with IADLs.      Hand Dominance        Extremity/Trunk Assessment   Upper Extremity Assessment Upper Extremity Assessment: Defer to OT evaluation    Lower Extremity Assessment Lower Extremity Assessment: LLE deficits/detail LLE Deficits / Details: Acute nondisplaced fracture of the left sacral ala. Grossly decr AROM and strength, with decr tolerance of WB LLE in stance, limited by pain LLE: Unable to fully assess due to pain LLE  Coordination: decreased gross motor    Cervical / Trunk Assessment Cervical / Trunk Assessment: Kyphotic  Communication   Communication: HOH  Cognition Arousal/Alertness: Awake/alert Behavior During Therapy: WFL for tasks assessed/performed Overall Cognitive Status: Within Functional Limits for tasks assessed                                 General Comments: WFL for tasks completed. Very  HOH and pt requiring increased time      General Comments      Exercises     Assessment/Plan    PT Assessment Patient needs continued PT services  PT Problem List Decreased strength;Decreased range of motion;Decreased activity tolerance;Decreased balance;Decreased mobility;Decreased coordination;Decreased cognition;Decreased knowledge of use of DME;Decreased safety awareness;Decreased knowledge of precautions;Pain       PT Treatment Interventions DME instruction;Gait training;Stair training;Functional mobility training;Therapeutic activities;Therapeutic exercise;Balance training;Patient/family education    PT Goals (Current goals can be found in the Care Plan section)  Acute Rehab PT Goals Patient Stated Goal: "stop the pain" PT Goal Formulation: With patient Time For Goal Achievement: 11/13/18 Potential to Achieve Goals: Fair    Frequency Min 2X/week   Barriers to discharge        Co-evaluation               AM-PAC PT "6 Clicks" Mobility  Outcome Measure Help needed turning from your back to your side while in a flat bed without using bedrails?: A Little Help needed moving from lying on your back to sitting on the side of a flat bed without using bedrails?: A Little Help needed moving to and from a bed to a chair (including a wheelchair)?: A Lot Help needed standing up from a chair using your arms (e.g., wheelchair or bedside chair)?: A Lot Help needed to walk in hospital room?: A Lot Help needed climbing 3-5 steps with a railing? : A Lot 6 Click Score: 14    End of Session Equipment Utilized During Treatment: Gait belt Activity Tolerance: Patient tolerated treatment well Patient left: in chair;with call bell/phone within reach;with chair alarm set Nurse Communication: Mobility status(SO painful with movement) PT Visit Diagnosis: Unsteadiness on feet (R26.81);Other abnormalities of gait and mobility (R26.89);Repeated falls (R29.6);Muscle weakness (generalized)  (M62.81);History of falling (Z91.81);Pain Pain - Right/Left: Left Pain - part of body: Leg    Time: 6659-9357 PT Time Calculation (min) (ACUTE ONLY): 18 min   Charges:   PT Evaluation $PT Eval Moderate Complexity: 1 Mod          Roney [place], [place]  Acute Rehabilitation Services Pager [phone] Office [phone]   [place] 10/30/2018, 12:49 PM

## 2018-10-30 NOTE — Evaluation (Signed)
Occupational Therapy Evaluation Patient Details Name: Belinda Webb MRN: [id number] DOB: [birth year] Today's Date: 10/30/2018    History of Present Illness Belinda Webb is a 85 y.o. female with medical history significant of hypertension, hyperlipidemia, CVA presenting to the hospital for evaluation of lower back pain and left hip pain after a recent fall.    Clinical Impression   PTA, pt was living alone and was performing ADLs and functional mobility with RW. Pt's family both provide assistance with IADLs and have help come for IADLs. Pt currently requiring Min-Max A for LB ADLs and Min Guard-Min A for functional transfers with RW. Pt presenting with significant pain at LLE impacting her functional performance. Pt would benefit from further acute OT to facilitate safe dc. Recommend dc to SNF for further OT to optimize safety, independence with ADLs, and return to PLOF.      Follow Up Recommendations  SNF;Supervision/Assistance - 24 hour    Equipment Recommendations  Other (comment)(Defer to next venue)    Recommendations for Other Services PT consult     Precautions / Restrictions Precautions Precautions: Fall Restrictions Weight Bearing Restrictions: No      Mobility Bed Mobility Overal bed mobility: Needs Assistance Bed Mobility: Supine to Sit     Supine to sit: Min assist;HOB elevated     General bed mobility comments: Pt using bed rails and Min A to elevate trunk. Pt able to bring BLEs towards EOB.   Transfers Overall transfer level: Needs assistance Equipment used: Rolling walker (2 wheeled) Transfers: Sit to/from [facility] Sit to Stand: Min assist;+2 safety/equipment Stand pivot transfers: Min guard;+2 safety/equipment       General transfer comment: Min A for power up into standing and stabilize in standing. Min Guard A for safety during pivot and cues to taker her time    Balance Overall balance assessment: Needs  assistance Sitting-balance support: No upper extremity supported;Feet supported Sitting balance-Leahy Scale: Fair     Standing balance support: Bilateral upper extremity supported;During functional activity Standing balance-Leahy Scale: Poor Standing balance comment: Reliant on UE support                           ADL either performed or assessed with clinical judgement   ADL Overall ADL's : Needs assistance/impaired Eating/Feeding: Set up;Supervision/ safety;Sitting   Grooming: Set up;Supervision/safety;Sitting;Wash/dry face   Upper Body Bathing: Set up;Supervision/ safety;Sitting   Lower Body Bathing: Minimal assistance;Sit to/from stand   Upper Body Dressing : Set up;Supervision/safety;Sitting   Lower Body Dressing: Maximal assistance;Sit to/from stand Lower Body Dressing Details (indicate cue type and reason): Donning socks with Max A while sitting at EOB. Pt reports that she wears simple clothes as well as slippers and sandles at home Toilet Transfer: Minimal assistance;+2 for safety/equipment;Stand-pivot;RW           Functional mobility during ADLs: Minimal assistance;Rolling walker;+2 for safety/equipment(stand pivot only) General ADL Comments: Pt with significant pain at LLE impacting her functional performance. Pt agreeable to participate with therapy and get OOB     Vision Baseline Vision/History: Wears glasses Patient Visual Report: No change from baseline       Perception     Praxis      Pertinent Vitals/Pain Pain Assessment: Faces Faces Pain Scale: Hurts whole lot Pain Location: LLE Pain Descriptors / Indicators: Constant;Discomfort;Grimacing;Moaning Pain Intervention(s): Monitored during session;Limited activity within patient's tolerance;Repositioned     Hand Dominance     Extremity/Trunk Assessment Upper  Extremity Assessment Upper Extremity Assessment: Generalized weakness   Lower Extremity Assessment Lower Extremity Assessment:  LLE deficits/detail LLE Deficits / Details: Acute nondisplaced fracture of the left sacral ala. LLE: Unable to fully assess due to pain LLE Coordination: decreased gross motor   Cervical / Trunk Assessment Cervical / Trunk Assessment: Kyphotic   Communication Communication Communication: HOH   Cognition Arousal/Alertness: Awake/alert Behavior During Therapy: WFL for tasks assessed/performed Overall Cognitive Status: Within Functional Limits for tasks assessed                                 General Comments: WFL for tasks completed. Very HOH and pt requiring increased time   General Comments       Exercises     Shoulder Instructions      Home Living Family/patient expects to be discharged to:: Skilled nursing facility Living Arrangements: Alone                               Additional Comments: RW      Prior Functioning/Environment Level of Independence: Independent with assistive device(s)        Comments: Pt was performing ADLs and using RW for functional mobility. Pt's family comes to assist with IADL and also hires people to assist with IADLs.         OT Problem List: Decreased strength;Decreased range of motion;Decreased activity tolerance;Impaired balance (sitting and/or standing);Decreased knowledge of use of DME or AE;Decreased knowledge of precautions;Decreased safety awareness;Pain      OT Treatment/Interventions: Self-care/ADL training;Therapeutic exercise;Energy conservation;DME and/or AE instruction;Therapeutic activities;Patient/family education    OT Goals(Current goals can be found in the care plan section) Acute Rehab OT Goals Patient Stated Goal: "stop the pain" OT Goal Formulation: With patient Time For Goal Achievement: 11/13/18 Potential to Achieve Goals: Good  OT Frequency: Min 2X/week   Barriers to D/C:            Co-evaluation              AM-PAC OT "6 Clicks" Daily Activity     Outcome Measure  Help from another person eating meals?: None Help from another person taking care of personal grooming?: A Little Help from another person toileting, which includes using toliet, bedpan, or urinal?: A Little Help from another person bathing (including washing, rinsing, drying)?: A Little Help from another person to put on and taking off regular upper body clothing?: A Little Help from another person to put on and taking off regular lower body clothing?: A Little 6 Click Score: 19   End of Session Equipment Utilized During Treatment: Gait belt;Rolling walker Nurse Communication: Mobility status  Activity Tolerance: Patient tolerated treatment well;Patient limited by pain Patient left: in chair;with call bell/phone within reach;with chair alarm set  OT Visit Diagnosis: Unsteadiness on feet (R26.81);Other abnormalities of gait and mobility (R26.89);Muscle weakness (generalized) (M62.81);Pain;History of falling (Z91.81) Pain - Right/Left: Left Pain - part of body: Leg                Time: 9798-9211 OT Time Calculation (min): 18 min Charges:  OT General Charges $OT Visit: 1 Visit OT Evaluation $OT Eval Moderate Complexity: [place], OTR/L Acute Rehab Pager: [phone] Office: [place] 10/30/2018, 10:56 AM

## 2018-10-30 NOTE — Progress Notes (Signed)
Patient unable to void. Text paged Dr Marlowe Sax. Verbal orders received from from MD to do In and Out cath. In and Out cath done. Unable to drain any urine as this nurse kept meeting resistance.Two other nurse tried however they both met resistance as well and patient become combative. Dr Marlowe Sax informed that In and Out was not sucessful. Orders received for a coude Cath, 6N RN informed.

## 2018-10-30 NOTE — Consult Note (Signed)
Reason for Consult:Sacral fxs Referring Physician: V Joelee Webb is an 85 y.o. female.  HPI: Belinda Webb was at home where she lives independently when her legs seemed to give out and she fell. She had immediate and severe back pain, mostly on the left side. She could not get up or ambulate. This happened ~4d ago. As the pain was not subsiding she came to the ED where CT showed left-sided sacral fxs and subacute right-sided ones. She still c/o significant pain. Currently appears comfortable seated in a chair. She is quite HoH.  Past Medical History:  Diagnosis Date  . Chronic cough   . HLD (hyperlipidemia)   . HTN (hypertension)   . Lung tumor 1984   LUL  . Stroke ([place])   . Vertigo     Past Surgical History:  Procedure Laterality Date  . APPENDECTOMY  1938  . BACK SURGERY    . BREAST BIOPSY     left  . CATARACT EXTRACTION, BILATERAL    . LUNG SURGERY  1984   LUL   . TONSILLECTOMY AND ADENOIDECTOMY    . TOTAL ABDOMINAL HYSTERECTOMY  1947    Family History  Problem Relation Age of Onset  . Diabetes Father   . Hypertension Father   . Hearing loss Other   . Stroke Mother   . Tuberculosis Sister   . Emphysema Brother   . Lung cancer Brother   . Colon polyps Son   . Colon cancer Neg Hx     Social History:  reports that she quit smoking about 36 years ago. Her smoking use included cigarettes. She has a 20.00 pack-year smoking history. She has never used smokeless tobacco. She reports that she does not drink alcohol or use drugs.  Allergies:  Allergies  Allergen Reactions  . Lipitor [Atorvastatin Calcium] Other (See Comments)    Reaction unknown  . Nitrofurantoin Monohyd Macro Other (See Comments)    Reaction unknown  . Penicillins Other (See Comments)    Has patient had a PCN reaction causing immediate rash, facial/tongue/throat swelling, SOB or lightheadedness with hypotension yes Has patient had a PCN reaction causing severe rash involving mucus membranes or  skin necrosis: no Has patient had a PCN reaction that required hospitalization: No Has patient had a PCN reaction occurring within the last 10 years: No If all of the above answers are "NO", then may proceed with Cephalosporin use.   Marland Kitchen Plavix [Clopidogrel Bisulfate] Other (See Comments)    Reaction unknown  . Statins Other (See Comments)    REACTION: effects muscles  . Sulfa Antibiotics Other (See Comments)    Reaction unknown    Medications: I have reviewed the patient's current medications.  Results for orders placed or performed during the hospital encounter of 10/29/18 (from the past 48 hour(s))  CBC with Differential     Status: None   Collection Time: 10/29/18  8:29 PM  Result Value Ref Range   WBC 9.4 4.0 - 10.5 K/uL   RBC 4.34 3.87 - 5.11 MIL/uL   Hemoglobin 13.6 12.0 - 15.0 g/dL   HCT 42.7 36.0 - 46.0 %   MCV 98.4 80.0 - 100.0 fL   MCH 31.3 26.0 - 34.0 pg   MCHC 31.9 30.0 - 36.0 g/dL   RDW 12.0 11.5 - 15.5 %   Platelets 270 150 - 400 K/uL   nRBC 0.0 0.0 - 0.2 %   Neutrophils Relative % 55 %   Neutro Abs 5.3 1.7 - 7.7  K/uL   Lymphocytes Relative 32 %   Lymphs Abs 3.0 0.7 - 4.0 K/uL   Monocytes Relative 11 %   Monocytes Absolute 1.0 0.1 - 1.0 K/uL   Eosinophils Relative 1 %   Eosinophils Absolute 0.1 0.0 - 0.5 K/uL   Basophils Relative 0 %   Basophils Absolute 0.0 0.0 - 0.1 K/uL   Immature Granulocytes 1 %   Abs Immature Granulocytes 0.05 0.00 - 0.07 K/uL    Comment: Performed at [facility] [address] [place], [place] [address]  Comprehensive metabolic panel     Status: Abnormal   Collection Time: 10/29/18  8:29 PM  Result Value Ref Range   Sodium 137 135 - 145 mmol/L   Potassium 3.4 (L) 3.5 - 5.1 mmol/L   Chloride 106 98 - 111 mmol/L   CO2 19 (L) 22 - 32 mmol/L   Glucose, Bld 98 70 - 99 mg/dL   BUN 20 8 - 23 mg/dL   Creatinine, Ser 1.09 (H) 0.44 - 1.00 mg/dL   Calcium 8.6 (L) 8.9 - 10.3 mg/dL   Total Protein 6.6 6.5 - 8.1 g/dL   Albumin 3.4  (L) 3.5 - 5.0 g/dL   AST 20 15 - 41 U/L   ALT 10 0 - 44 U/L   Alkaline Phosphatase 237 (H) 38 - 126 U/L   Total Bilirubin 1.2 0.3 - 1.2 mg/dL   GFR calc non Af Amer 44 (L) >60 mL/min   GFR calc Af Amer 51 (L) >60 mL/min   Anion gap 12 5 - 15    Comment: Performed at [facility], [place] [address] [place], [place] [address]  Lipase, blood     Status: None   Collection Time: 10/29/18  8:29 PM  Result Value Ref Range   Lipase 33 11 - 51 U/L    Comment: Performed at [facility] [address] [place], [place] [address]  Urinalysis, Routine w reflex microscopic     Status: Abnormal   Collection Time: 10/29/18  9:09 PM  Result Value Ref Range   Color, Urine YELLOW YELLOW   APPearance CLEAR CLEAR   Specific Gravity, Urine 1.011 1.005 - 1.030   pH 8.0 5.0 - 8.0   Glucose, UA NEGATIVE NEGATIVE mg/dL   Hgb urine dipstick NEGATIVE NEGATIVE   Bilirubin Urine NEGATIVE NEGATIVE   Ketones, ur 20 (A) NEGATIVE mg/dL   Protein, ur NEGATIVE NEGATIVE mg/dL   Nitrite NEGATIVE NEGATIVE   Leukocytes, UA NEGATIVE NEGATIVE    Comment: Performed at [facility] [address], [place], [place] [address]  Basic metabolic panel     Status: Abnormal   Collection Time: 10/30/18 12:49 AM  Result Value Ref Range   Sodium 134 (L) 135 - 145 mmol/L   Potassium 3.7 3.5 - 5.1 mmol/L   Chloride 106 98 - 111 mmol/L   CO2 20 (L) 22 - 32 mmol/L   Glucose, Bld 100 (H) 70 - 99 mg/dL   BUN 17 8 - 23 mg/dL   Creatinine, Ser 0.95 0.44 - 1.00 mg/dL   Calcium 7.8 (L) 8.9 - 10.3 mg/dL   GFR calc non Af Amer 52 (L) >60 mL/min   GFR calc Af Amer >60 >60 mL/min   Anion gap 8 5 - 15    Comment: Performed at [facility] [address] [place], [place] [address]  Gamma GT     Status: None   Collection Time: 10/30/18  12:49 AM  Result Value Ref Range   GGT 14 7 - 50 U/L    Comment: Performed at [facility], [place] [address], [place], [place] [address]  Magnesium     Status: None    Collection Time: 10/30/18 12:49 AM  Result Value Ref Range   Magnesium 2.0 1.7 - 2.4 mg/dL    Comment: Performed at [facility] [address] [place], [place] [address]    Ct Abdomen Pelvis Wo Contrast  Result Date: 10/29/2018 CLINICAL DATA:  85 year old female with history of left-sided lower back pain. Recent history of fall onto the right side. EXAM: CT ABDOMEN AND PELVIS WITHOUT CONTRAST TECHNIQUE: Multidetector CT imaging of the abdomen and pelvis was performed following the standard protocol without IV contrast. COMPARISON:  No priors. FINDINGS: Lower chest: Mild scarring in the lung bases. Atherosclerotic calcifications in the descending thoracic aorta and the right coronary artery. Hepatobiliary: No definite suspicious cystic or solid hepatic lesions are confidently identified on today's noncontrast CT examination. Unenhanced appearance of the gallbladder is normal. Pancreas: No definite pancreatic mass or peripancreatic fluid or inflammatory changes are noted on today's noncontrast CT examination. Spleen: Unremarkable. Adrenals/Urinary Tract: Unenhanced appearance of the kidneys and bilateral adrenal glands is normal. No hydroureteronephrosis. Urinary bladder is normal in appearance. Stomach/Bowel: Unenhanced appearance of the stomach is normal. No pathologic dilatation of small bowel or colon. Numerous colonic diverticulae are noted, without surrounding inflammatory changes to suggest an acute diverticulitis at this time. The appendix is not confidently identified and may be surgically absent. Regardless, there are no inflammatory changes noted adjacent to the cecum to suggest the presence of an acute appendicitis at this time. Vascular/Lymphatic: Extensive atherosclerotic calcifications throughout the abdominal aorta and pelvic vasculature. No lymphadenopathy noted in the abdomen or pelvis. Reproductive: Uterus and ovaries are atrophic. Other: No significant volume of ascites.  No  pneumoperitoneum. Musculoskeletal: Acute nondisplaced fracture of the left sacral ala. Chronic compression fracture of L2 vertebral body with 60% loss of central vertebral body height and post vertebroplasty changes. Nondisplaced fractures and ill-defined areas of sclerosis in the parasymphyseal region of the right pubic bone and in the right sacral ala. There are no aggressive appearing lytic or blastic lesions noted in the visualized portions of the skeleton. IMPRESSION: 1. Acute nondisplaced fracture of the left sacral ala. Healing nondisplaced fractures of the parasymphyseal region of the right pubic bone and right sacral ala. 2. No other acute findings are noted in the abdomen or pelvis to account for the patient's symptoms. 3. Chronic compression fracture of L2 vertebral body with post vertebroplasty changes, similar to the prior study, as above. 4. Colonic diverticulosis without evidence of acute diverticulitis at this time. 5. Aortic atherosclerosis, in addition to at least right coronary artery disease. 6. Additional incidental findings, as above. Electronically Signed   By: Vinnie Langton M.D.   On: 10/29/2018 20:05   Ct L-spine No Charge  Result Date: 10/29/2018 CLINICAL DATA:  85 year old female status post fall at home with low back and left side pain. EXAM: CT LUMBAR SPINE WITHOUT CONTRAST TECHNIQUE: Technique: Multiplanar CT images of the lumbar spine were reconstructed from contemporary CT of the Abdomen and Pelvis. CONTRAST:  None. COMPARISON:  CT Abdomen and Pelvis today reported separately. Lumbar spine CT 01/30/2018. FINDINGS: Segmentation: Normal, the same numbering system on the 2019 CT. Alignment: Stable lumbar and lower thoracic alignment since 2019. New ventral impaction and anterior angulation of the central sacrum at S1-S2 (sagittal  image 47). See additional details below. Vertebrae: Acute nondisplaced left sacral ala fracture best demonstrated on coronal image 34. Acute on chronic  right sacral ala fracture (same image) is also nondisplaced. Mild impaction of the ventral S1-S2 cortex. The entire sacrum is not included on these images. Chronic and previously augmented L2 compression fracture is stable since 2019. No acute osseous abnormality identified in the lower thoracic spine. Likewise, the L1 through L4 levels appears stable and intact. There is a nondisplaced left L5 transverse process fracture which appears acute and is best seen on coronal image 38. The L5 vertebra otherwise appears intact. Paraspinal and other soft tissues: Reported with the CT Abdomen and Pelvis today separately. Disc levels: Stable since 2019, mild for age lumbar spine degeneration. IMPRESSION: 1. Acute nondisplaced sacral fractures affecting the bilateral ala (right side is acute on chronic) and the central S1-S2 bodies ventrally. 2. Acute nondisplaced left L5 transverse process fracture (stable injury). 3. No other acute osseous abnormality in the lumbar spine. Chronic and previously augmented L2 compression fracture. 4.  CT Abdomen and Pelvis today are reported separately. Electronically Signed   By: Genevie Ann M.D.   On: 10/29/2018 20:06   Dg Femur Min 2 Views Left  Result Date: 10/29/2018 CLINICAL DATA:  Bilateral femoral pain after a fall today. Legs gave out. EXAM: LEFT FEMUR 2 VIEWS COMPARISON:  None. FINDINGS: Mild degenerative changes in the hip and knee. No evidence of acute fracture or dislocation of the left femur. No focal bone lesion or bone destruction. Bone cortex appears intact. Vascular calcifications. IMPRESSION: No acute bony abnormalities. Electronically Signed   By: Lucienne Capers M.D.   On: 10/29/2018 20:16   Dg Femur Min 2 Views Right  Result Date: 10/29/2018 CLINICAL DATA:  Bilateral femoral pain after a fall today. Legs gave out. EXAM: RIGHT FEMUR 2 VIEWS COMPARISON:  None. FINDINGS: Moderate degenerative changes demonstrated in the right hip with joint space narrowing and osteophyte  formation on both sides of the joint. No evidence of acute fracture or dislocation. No focal bone lesion or bone destruction. Bone cortex appears intact. Vascular calcifications. IMPRESSION: Degenerative changes in the right hip. No acute bony abnormalities. Electronically Signed   By: Lucienne Capers M.D.   On: 10/29/2018 20:17    Review of Systems  Constitutional: Negative for weight loss.  HENT: Negative for ear discharge, ear pain, hearing loss and tinnitus.   Eyes: Negative for blurred vision, double vision, photophobia and pain.  Respiratory: Negative for cough, sputum production and shortness of breath.   Cardiovascular: Negative for chest pain.  Gastrointestinal: Negative for abdominal pain, nausea and vomiting.  Genitourinary: Negative for dysuria, flank pain, frequency and urgency.  Musculoskeletal: Positive for back pain and joint pain (Left back/hip). Negative for falls, myalgias and neck pain.  Neurological: Negative for dizziness, tingling, sensory change, focal weakness, loss of consciousness and headaches.  Endo/Heme/Allergies: Does not bruise/bleed easily.  Psychiatric/Behavioral: Negative for depression, memory loss and substance abuse. The patient is not nervous/anxious.    Blood pressure (!) 158/72, pulse (!) 57, temperature (!) 97.4 F (36.3 C), temperature source Oral, resp. rate 14, height 5' (1.524 m), weight 44.7 kg, SpO2 97 %. Physical Exam  Constitutional: She appears well-developed and well-nourished. No distress.  HENT:  Head: Normocephalic and atraumatic.  Eyes: Conjunctivae are normal. Right eye exhibits no discharge. Left eye exhibits no discharge. No scleral icterus.  Neck: Normal range of motion.  Cardiovascular: Normal rate and regular rhythm.  Respiratory: Effort  normal. No respiratory distress.  Musculoskeletal:     Comments: LLE No traumatic wounds, ecchymosis, or rash  TTP lumbosacral area  No knee or ankle effusion  Knee stable to varus/ valgus  and anterior/posterior stress  Sens DPN, SPN, TN intact  Motor EHL, ext, flex, evers 4/5  DP 2+, PT 1+, No significant edema  Neurological: She is alert.  Skin: Skin is warm and dry. She is not diaphoretic.  Psychiatric: She has a normal mood and affect. Her behavior is normal.    Assessment/Plan: Sacral fxs -- May WBAT with RW. No surgical indications. Would anticipate these will heal with time. She should f/u with Dr. Marlou Sa in 3-4 weeks.    Lisette Abu, PA-C Orthopedic Surgery [phone] 10/30/2018, 11:55 AM

## 2018-10-30 NOTE — Clinical Social Work Note (Signed)
Clinical Social Work Assessment  Patient Details  Name: Belinda Webb MRN: [id number] Date of Birth: 1925-01-01  Date of referral:  10/30/18               Reason for consult:  Facility Placement                Permission sought to share information with:  Chartered certified accountant granted to share information::  Yes, Verbal Permission Granted  Name::     Ron, Therapist, sports::  SNF  Relationship::  Engineer, manufacturing systems Information:     Housing/Transportation Living arrangements for the past 2 months:  [place] of Information:  Medical Team, Adult Children Patient Interpreter Needed:  None Criminal Activity/Legal Involvement Pertinent to Current Situation/Hospitalization:  No - Comment as needed Significant Relationships:  Adult Children Lives with:  Self Do you feel safe going back to the place where you live?  Yes Need for family participation in patient care:  Yes (Comment)  Care giving concerns:  Patient from home alone, but will need short term rehab at discharge and likely long term care upon completion of rehab. Patient's sons believe that the patient will likely not be safe to return home, ever.   Social Worker assessment / plan:  CSW spoke with patient's son, Chriss Czar, over the phone about discharge plans. Ron indicated that the patient lives home alone and there is not much that the sons can do in the way of providing care for her, as Chriss Czar has his own health issues and his brother travels a lot for work. Ron requested placement somewhere in between where the sons live [place] and [place]). CSW received permission to fax out referral and will follow up with bed offers.   Employment status:  Retired Nurse, adult PT Recommendations:  [place] / Referral to community resources:  [place]  Patient/Family's Response to care:  Patient's sons agreeable to placement for the patient.  Patient's response unable to be determined at this time as she is not fully oriented.  Patient/Family's Understanding of and Emotional Response to Diagnosis, Current Treatment, and Prognosis:  Patient's son Ron acknowledged understanding that the patient is not safe to be home alone at this time, and that she may not ever be safe enough to go home. Patient's sons just want to make sure that she is taken care of and close enough that they can come and check on her when they're able.   Emotional Assessment Appearance:  Appears stated age Attitude/Demeanor/Rapport:  Unable to Assess Affect (typically observed):  Unable to Assess Orientation:  Oriented to Self, Oriented to Place, Oriented to Situation Alcohol / Substance use:  Not Applicable Psych involvement (Current and /or in the community):  No (Comment)  Discharge Needs  Concerns to be addressed:  Care Coordination Readmission within the last 30 days:  No Current discharge risk:  Physical Impairment, Dependent with Mobility, Lives alone Barriers to Discharge:  Continued Medical Work up, [place], [place] 10/30/2018, 4:54 PM

## 2018-10-30 NOTE — NC FL2 (Signed)
[place] LEVEL OF CARE SCREENING TOOL     IDENTIFICATION  Patient Name: Belinda Webb Birthdate: 05/13/1925 Sex: female Admission Date (Current Location): 10/29/2018  [facility] and [place] Number:  [employer] and Address:  [facility] [place]. [facility], [place] [address], [place], [place] [address]      Provider Number: [id number]  Attending Physician Name and Address:  Antonieta Pert, MD  Relative Name and Phone Number:  Hillery Jacks; son; [phone]    Current Level of Care: Hospital Recommended Level of Care: [place] Prior Approval Number:    Date Approved/Denied:   PASRR Number: [id number]  Discharge Plan: SNF    Current Diagnoses: Patient Active Problem List   Diagnosis Date Noted  . Hip pain 10/29/2018  . Sacral fracture ([place]) 10/29/2018  . Lumbar vertebral fracture ([place]) 10/29/2018  . Lower back pain 10/29/2018  . Hypokalemia 10/29/2018  . Normal anion gap metabolic acidosis 85/63/1497  . Elevated alkaline phosphatase level 10/29/2018  . CKD (chronic kidney disease) stage 3, GFR 30-59 ml/min (HCC) 10/29/2018  . DDD (degenerative disc disease), lumbar 05/15/2017  . Lumbar pain with radiation down both legs 04/11/2017  . Weakness of both lower extremities 04/11/2017  . History of fall 04/11/2017  . Risk for falls 04/11/2017  . BMI less than 19,adult 05/06/2015  . Cancer of chest (wall) ([place]) 01/21/2015  . HTN (hypertension) 04/16/2012  . Fall 04/15/2012  . Hoarseness 11/22/2011  . Esophageal dysmotility 11/22/2011  . Chronic cough   . Stroke [facility])     Orientation RESPIRATION BLADDER Height & Weight     Self, Situation, Place  Normal Continent, Indwelling catheter(foley for retention) Weight: 98 lb 8.7 oz (44.7 kg) Height:  5' (152.4 cm)  BEHAVIORAL SYMPTOMS/MOOD NEUROLOGICAL BOWEL NUTRITION STATUS      Continent Diet(see discharge summary)  AMBULATORY STATUS COMMUNICATION OF NEEDS Skin   Limited Assist  Verbally Skin abrasions(abrasion on left leg with foam dressing)                       Personal Care Assistance Level of Assistance  Bathing, Feeding, Dressing Bathing Assistance: Limited assistance Feeding assistance: Independent Dressing Assistance: Limited assistance     Functional Limitations Info  Sight, Hearing, Speech Sight Info: Adequate(wears glasses) Hearing Info: Impaired Speech Info: Adequate    SPECIAL CARE FACTORS FREQUENCY  PT (By licensed PT), OT (By licensed OT)     PT Frequency: 5x week OT Frequency: 5x week            Contractures Contractures Info: Not present    Additional Factors Info  Code Status, Allergies Code Status Info: Full Code Allergies Info: LIPITOR ATORVASTATIN CALCIUM, NITROFURANTOIN MONOHYD MACRO, PENICILLINS, PLAVIX CLOPIDOGREL BISULFATE, STATINS, SULFA ANTIBIOTICS            Current Medications (10/30/2018):  This is the current hospital active medication list Current Facility-Administered Medications  Medication Dose Route Frequency Provider Last Rate Last Dose  . acetaminophen (TYLENOL) tablet 650 mg  650 mg Oral Q6H PRN Shela Leff, MD      . amLODipine (NORVASC) tablet 5 mg  5 mg Oral Daily Shela Leff, MD   5 mg at 10/30/18 0834  . enoxaparin (LOVENOX) injection 30 mg  30 mg Subcutaneous Q24H Shela Leff, MD   30 mg at 10/30/18 0834  . lidocaine (XYLOCAINE) 2 % jelly 1 application  1 application Urethral STAT Shela Leff, MD      .  meclizine (ANTIVERT) tablet 25 mg  25 mg Oral Daily PRN Shela Leff, MD      . morphine 2 MG/ML injection 1 mg  1 mg Intravenous Q4H PRN Shela Leff, MD   1 mg at 10/30/18 1327     Discharge Medications: Please see discharge summary for a list of discharge medications.  Relevant Imaging Results:  Relevant Lab Results:   Additional Information [id number] [place], [place]

## 2018-10-30 NOTE — Plan of Care (Signed)
  Problem: Education: Goal: Knowledge of General Education information will improve Description Including pain rating scale, medication(s)/side effects and non-pharmacologic comfort measures Outcome: Progressing   Problem: Clinical Measurements: Goal: Ability to maintain clinical measurements within normal limits will improve Outcome: Progressing Goal: Will remain free from infection Outcome: Progressing Goal: Respiratory complications will improve Outcome: Not Applicable Goal: Cardiovascular complication will be avoided Outcome: Completed/Met   Problem: Activity: Goal: Risk for activity intolerance will decrease Outcome: Progressing

## 2018-10-31 ENCOUNTER — Encounter (HOSPITAL_COMMUNITY): Payer: Self-pay | Admitting: *Deleted

## 2018-10-31 DIAGNOSIS — M25551 Pain in right hip: Secondary | ICD-10-CM | POA: Diagnosis not present

## 2018-10-31 DIAGNOSIS — W1830XA Fall on same level, unspecified, initial encounter: Secondary | ICD-10-CM | POA: Diagnosis present

## 2018-10-31 DIAGNOSIS — M549 Dorsalgia, unspecified: Secondary | ICD-10-CM | POA: Diagnosis present

## 2018-10-31 DIAGNOSIS — Z87891 Personal history of nicotine dependence: Secondary | ICD-10-CM | POA: Diagnosis not present

## 2018-10-31 DIAGNOSIS — Z9181 History of falling: Secondary | ICD-10-CM | POA: Diagnosis not present

## 2018-10-31 DIAGNOSIS — Z66 Do not resuscitate: Secondary | ICD-10-CM | POA: Diagnosis present

## 2018-10-31 DIAGNOSIS — N183 Chronic kidney disease, stage 3 (moderate): Secondary | ICD-10-CM | POA: Diagnosis present

## 2018-10-31 DIAGNOSIS — Z79899 Other long term (current) drug therapy: Secondary | ICD-10-CM | POA: Diagnosis not present

## 2018-10-31 DIAGNOSIS — Z8673 Personal history of transient ischemic attack (TIA), and cerebral infarction without residual deficits: Secondary | ICD-10-CM | POA: Diagnosis not present

## 2018-10-31 DIAGNOSIS — R339 Retention of urine, unspecified: Secondary | ICD-10-CM | POA: Diagnosis present

## 2018-10-31 DIAGNOSIS — E785 Hyperlipidemia, unspecified: Secondary | ICD-10-CM | POA: Diagnosis present

## 2018-10-31 DIAGNOSIS — Z882 Allergy status to sulfonamides status: Secondary | ICD-10-CM | POA: Diagnosis not present

## 2018-10-31 DIAGNOSIS — S3210XA Unspecified fracture of sacrum, initial encounter for closed fracture: Secondary | ICD-10-CM | POA: Diagnosis present

## 2018-10-31 DIAGNOSIS — Z888 Allergy status to other drugs, medicaments and biological substances status: Secondary | ICD-10-CM | POA: Diagnosis not present

## 2018-10-31 DIAGNOSIS — R748 Abnormal levels of other serum enzymes: Secondary | ICD-10-CM | POA: Diagnosis present

## 2018-10-31 DIAGNOSIS — Z880 Allergy status to penicillin: Secondary | ICD-10-CM | POA: Diagnosis not present

## 2018-10-31 DIAGNOSIS — W19XXXA Unspecified fall, initial encounter: Secondary | ICD-10-CM | POA: Diagnosis not present

## 2018-10-31 DIAGNOSIS — S32059A Unspecified fracture of fifth lumbar vertebra, initial encounter for closed fracture: Secondary | ICD-10-CM | POA: Diagnosis present

## 2018-10-31 DIAGNOSIS — M25552 Pain in left hip: Secondary | ICD-10-CM | POA: Diagnosis not present

## 2018-10-31 DIAGNOSIS — Y92009 Unspecified place in unspecified non-institutional (private) residence as the place of occurrence of the external cause: Secondary | ICD-10-CM | POA: Diagnosis not present

## 2018-10-31 DIAGNOSIS — H9190 Unspecified hearing loss, unspecified ear: Secondary | ICD-10-CM | POA: Diagnosis present

## 2018-10-31 DIAGNOSIS — E876 Hypokalemia: Secondary | ICD-10-CM | POA: Diagnosis present

## 2018-10-31 DIAGNOSIS — E872 Acidosis: Secondary | ICD-10-CM | POA: Diagnosis present

## 2018-10-31 DIAGNOSIS — I129 Hypertensive chronic kidney disease with stage 1 through stage 4 chronic kidney disease, or unspecified chronic kidney disease: Secondary | ICD-10-CM | POA: Diagnosis present

## 2018-10-31 LAB — COMPREHENSIVE METABOLIC PANEL
ALT: 9 U/L (ref 0–44)
AST: 15 U/L (ref 15–41)
Albumin: 2.7 g/dL — ABNORMAL LOW (ref 3.5–5.0)
Alkaline Phosphatase: 187 U/L — ABNORMAL HIGH (ref 38–126)
Anion gap: 8 (ref 5–15)
BUN: 23 mg/dL (ref 8–23)
CO2: 21 mmol/L — ABNORMAL LOW (ref 22–32)
Calcium: 7.9 mg/dL — ABNORMAL LOW (ref 8.9–10.3)
Chloride: 106 mmol/L (ref 98–111)
Creatinine, Ser: 1.04 mg/dL — ABNORMAL HIGH (ref 0.44–1.00)
GFR calc Af Amer: 54 mL/min — ABNORMAL LOW (ref >60)
GFR calc non Af Amer: 47 mL/min — ABNORMAL LOW (ref >60)
Glucose, Bld: 84 mg/dL (ref 70–99)
Potassium: 4.3 mmol/L (ref 3.5–5.1)
SODIUM: 135 mmol/L (ref 135–145)
Total Bilirubin: 0.5 mg/dL (ref 0.3–1.2)
Total Protein: 5.1 g/dL — ABNORMAL LOW (ref 6.5–8.1)

## 2018-10-31 MED ORDER — OXYCODONE HCL 5 MG PO TABS
2.5000 mg | ORAL_TABLET | Freq: Four times a day (QID) | ORAL | Status: DC | PRN
  Administered 2018-10-31: 2.5 mg via ORAL
  Filled 2018-10-31: qty 1

## 2018-10-31 MED ORDER — SODIUM CHLORIDE 0.9 % IV SOLN
INTRAVENOUS | Status: DC
  Administered 2018-10-31 – 2018-11-01 (×3): via INTRAVENOUS

## 2018-10-31 MED ORDER — TAMSULOSIN HCL 0.4 MG PO CAPS
0.4000 mg | ORAL_CAPSULE | Freq: Every day | ORAL | Status: DC
  Administered 2018-10-31: 0.4 mg via ORAL
  Filled 2018-10-31: qty 1

## 2018-10-31 NOTE — Progress Notes (Signed)
PROGRESS NOTE    Belinda Webb  MIW:[id number] DOB: Nov 10, 1924 DOA: 10/29/2018 PCP: Theodoro Clock   Brief Narrative: 85 year old female with history of hypertension, hyperlipidemia, CVA admitted from home with fall 3 days PTA admission.  She has been having recent falls, back in December had injured her right hip, was seen by her orthopedic and had unremarkable MRI reportedly.  In the ER CT L SPINE showed sacral and lumbar vertebral fractures, also had urine retention. Orthopedic Dr. Marlou Sa was consulted in the ER and patient was admitted.  Assessment & Plan:  Low back pain/hip pain secondary to sacral and lumbar vertebral fractures from a recent mechanical fall. Ct lumbar Spine with acute nondisplaced sacral fractures of bilateral ala right-sided acute on chronic, central S1-S2 bodies  ventrally, acute nondisplaced left L5 transverse process fracture(stable injury), chronic and previously augmented L2 compression fracture.  Patient has been needing IV morphine multiple doses to manage her complex pain.  Add oral oxycodone. Appreciate orthopedic input continue WBAT, PT OT and follow up with orthopedic as outpatient.  Falls recent at home, continue supportive care.  Cont PT OT.  She will need SNF.  Acute urinary retention, patient has indewelling catheter in place.  CT abdomen pelvis showed no evidence of hydroureteronephrosis. We will start flomax nightly and do voiding trail in am.  HTN : Blood pressure well controlled.  Continue amlodipine.   Hypokalemia resolved.  Normal anion gap metabolic acidosis: bicarb at 21. Monitor  Elevated alkaline phosphatase, otherwise normal LFTs.  No hepatobiliary abnormality in the CT scan. GGT nl. Repeat lfts improving.    CKD stage 3: Creatinine stable.  Monitor.   Changed the patient to inpatient status due to need for IV opiates multiple doses, to manage her complex pain.   DVT prophylaxis: Lovenox Code Status: DNR Family  Communication: No family at bedside. Disposition Plan: Skilled nursing facility once available, cont on pain control for now.  Consultants: Orthopedic  Procedures: None  Antimicrobials: None  Subjective: Seen and examined this morning.  Complains of ongoing pain on the back/hip with activity. Has been needing IV morphine for pain.  Objective: Vitals:   10/30/18 1407 10/30/18 2110 10/31/18 0453 10/31/18 1044  BP: (!) 121/59 133/62 (!) 147/67 112/62  Pulse: 62 69 (!) 58 60  Resp: 16 14 14 16   Temp: 97.9 F (36.6 C) (!) 97.5 F (36.4 C)  97.9 F (36.6 C)  TempSrc: Oral Oral  Oral  SpO2: 98% 99% 98% 98%  Weight:      Height:        Intake/Output Summary (Last 24 hours) at 10/31/2018 1347 Last data filed at 10/31/2018 0847 Gross per 24 hour  Intake 120 ml  Output 1075 ml  Net -955 ml   Filed Weights   10/29/18 1844 10/29/18 2325  Weight: 49.9 kg 44.7 kg   Weight change:   Body mass index is 19.25 kg/m.  Examination:  General exam: Calm, comfortable, elderly and frail.   HEENT:Oral mucosa moist, Ear/Nose WNL grossly, dentition normal. Respiratory system: Bilateral equal air entry, no crackles and wheezing, no use of accessory muscle. Cardiovascular system: regular rate and rhythm, S1 & S2 heard, No JVD/murmurs.No pedal edema. Gastrointestinal system: Abdomen soft, nontender non-distended, BS +. No hepatosplenomegaly palpable. Nervous System:Alert, awake and oriented at baseline. Able to move UE and LE, sensation intact. Extremities: No edema, distal peripheral pulses palpable.  Skin: No rashes,no icterus. MSK: Normal muscle bulk,tone ,power.  Tenderness on sacral area  Medications:  Scheduled Meds: . amLODipine  5 mg Oral Daily  . enoxaparin (LOVENOX) injection  30 mg Subcutaneous Q24H   Continuous Infusions:  Data Reviewed: I have personally reviewed following labs and imaging studies  CBC: Recent Labs  Lab 10/29/18 2029  WBC 9.4  NEUTROABS 5.3  HGB  13.6  HCT 42.7  MCV 98.4  PLT 671   Basic Metabolic Panel: Recent Labs  Lab 10/29/18 2029 10/30/18 0049 10/31/18 0132  NA 137 134* 135  K 3.4* 3.7 4.3  CL 106 106 106  CO2 19* 20* 21*  GLUCOSE 98 100* 84  BUN 20 17 23   CREATININE 1.09* 0.95 1.04*  CALCIUM 8.6* 7.8* 7.9*  MG  --  2.0  --    GFR: Estimated Creatinine Clearance: 24.9 mL/min (A) (by C-G formula based on SCr of 1.04 mg/dL (H)). Liver Function Tests: Recent Labs  Lab 10/29/18 2029 10/31/18 0132  AST 20 15  ALT 10 9  ALKPHOS 237* 187*  BILITOT 1.2 0.5  PROT 6.6 5.1*  ALBUMIN 3.4* 2.7*   Recent Labs  Lab 10/29/18 2029  LIPASE 33   No results for input(s): AMMONIA in the last 168 hours. Coagulation Profile: No results for input(s): INR, PROTIME in the last 168 hours. Cardiac Enzymes: No results for input(s): CKTOTAL, CKMB, CKMBINDEX, TROPONINI in the last 168 hours. BNP (last 3 results) No results for input(s): PROBNP in the last 8760 hours. HbA1C: No results for input(s): HGBA1C in the last 72 hours. CBG: No results for input(s): GLUCAP in the last 168 hours. Lipid Profile: No results for input(s): CHOL, HDL, LDLCALC, TRIG, CHOLHDL, LDLDIRECT in the last 72 hours. Thyroid Function Tests: No results for input(s): TSH, T4TOTAL, FREET4, T3FREE, THYROIDAB in the last 72 hours. Anemia Panel: No results for input(s): VITAMINB12, FOLATE, FERRITIN, TIBC, IRON, RETICCTPCT in the last 72 hours. Sepsis Labs: No results for input(s): PROCALCITON, LATICACIDVEN in the last 168 hours.  No results found for this or any previous visit (from the past 240 hour(s)).    Radiology Studies: Ct Abdomen Pelvis Wo Contrast  Result Date: 10/29/2018 CLINICAL DATA:  85 year old female with history of left-sided lower back pain. Recent history of fall onto the right side. EXAM: CT ABDOMEN AND PELVIS WITHOUT CONTRAST TECHNIQUE: Multidetector CT imaging of the abdomen and pelvis was performed following the standard protocol  without IV contrast. COMPARISON:  No priors. FINDINGS: Lower chest: Mild scarring in the lung bases. Atherosclerotic calcifications in the descending thoracic aorta and the right coronary artery. Hepatobiliary: No definite suspicious cystic or solid hepatic lesions are confidently identified on today's noncontrast CT examination. Unenhanced appearance of the gallbladder is normal. Pancreas: No definite pancreatic mass or peripancreatic fluid or inflammatory changes are noted on today's noncontrast CT examination. Spleen: Unremarkable. Adrenals/Urinary Tract: Unenhanced appearance of the kidneys and bilateral adrenal glands is normal. No hydroureteronephrosis. Urinary bladder is normal in appearance. Stomach/Bowel: Unenhanced appearance of the stomach is normal. No pathologic dilatation of small bowel or colon. Numerous colonic diverticulae are noted, without surrounding inflammatory changes to suggest an acute diverticulitis at this time. The appendix is not confidently identified and may be surgically absent. Regardless, there are no inflammatory changes noted adjacent to the cecum to suggest the presence of an acute appendicitis at this time. Vascular/Lymphatic: Extensive atherosclerotic calcifications throughout the abdominal aorta and pelvic vasculature. No lymphadenopathy noted in the abdomen or pelvis. Reproductive: Uterus and ovaries are atrophic. Other: No significant volume of ascites.  No pneumoperitoneum. Musculoskeletal: Acute nondisplaced fracture  of the left sacral ala. Chronic compression fracture of L2 vertebral body with 60% loss of central vertebral body height and post vertebroplasty changes. Nondisplaced fractures and ill-defined areas of sclerosis in the parasymphyseal region of the right pubic bone and in the right sacral ala. There are no aggressive appearing lytic or blastic lesions noted in the visualized portions of the skeleton. IMPRESSION: 1. Acute nondisplaced fracture of the left sacral  ala. Healing nondisplaced fractures of the parasymphyseal region of the right pubic bone and right sacral ala. 2. No other acute findings are noted in the abdomen or pelvis to account for the patient's symptoms. 3. Chronic compression fracture of L2 vertebral body with post vertebroplasty changes, similar to the prior study, as above. 4. Colonic diverticulosis without evidence of acute diverticulitis at this time. 5. Aortic atherosclerosis, in addition to at least right coronary artery disease. 6. Additional incidental findings, as above. Electronically Signed   By: Vinnie Langton M.D.   On: 10/29/2018 20:05   Ct L-spine No Charge  Result Date: 10/29/2018 CLINICAL DATA:  85 year old female status post fall at home with low back and left side pain. EXAM: CT LUMBAR SPINE WITHOUT CONTRAST TECHNIQUE: Technique: Multiplanar CT images of the lumbar spine were reconstructed from contemporary CT of the Abdomen and Pelvis. CONTRAST:  None. COMPARISON:  CT Abdomen and Pelvis today reported separately. Lumbar spine CT 01/30/2018. FINDINGS: Segmentation: Normal, the same numbering system on the 2019 CT. Alignment: Stable lumbar and lower thoracic alignment since 2019. New ventral impaction and anterior angulation of the central sacrum at S1-S2 (sagittal image 47). See additional details below. Vertebrae: Acute nondisplaced left sacral ala fracture best demonstrated on coronal image 34. Acute on chronic right sacral ala fracture (same image) is also nondisplaced. Mild impaction of the ventral S1-S2 cortex. The entire sacrum is not included on these images. Chronic and previously augmented L2 compression fracture is stable since 2019. No acute osseous abnormality identified in the lower thoracic spine. Likewise, the L1 through L4 levels appears stable and intact. There is a nondisplaced left L5 transverse process fracture which appears acute and is best seen on coronal image 38. The L5 vertebra otherwise appears intact.  Paraspinal and other soft tissues: Reported with the CT Abdomen and Pelvis today separately. Disc levels: Stable since 2019, mild for age lumbar spine degeneration. IMPRESSION: 1. Acute nondisplaced sacral fractures affecting the bilateral ala (right side is acute on chronic) and the central S1-S2 bodies ventrally. 2. Acute nondisplaced left L5 transverse process fracture (stable injury). 3. No other acute osseous abnormality in the lumbar spine. Chronic and previously augmented L2 compression fracture. 4.  CT Abdomen and Pelvis today are reported separately. Electronically Signed   By: Genevie Ann M.D.   On: 10/29/2018 20:06   Dg Femur Min 2 Views Left  Result Date: 10/29/2018 CLINICAL DATA:  Bilateral femoral pain after a fall today. Legs gave out. EXAM: LEFT FEMUR 2 VIEWS COMPARISON:  None. FINDINGS: Mild degenerative changes in the hip and knee. No evidence of acute fracture or dislocation of the left femur. No focal bone lesion or bone destruction. Bone cortex appears intact. Vascular calcifications. IMPRESSION: No acute bony abnormalities. Electronically Signed   By: Lucienne Capers M.D.   On: 10/29/2018 20:16   Dg Femur Min 2 Views Right  Result Date: 10/29/2018 CLINICAL DATA:  Bilateral femoral pain after a fall today. Legs gave out. EXAM: RIGHT FEMUR 2 VIEWS COMPARISON:  None. FINDINGS: Moderate degenerative changes demonstrated in the right  hip with joint space narrowing and osteophyte formation on both sides of the joint. No evidence of acute fracture or dislocation. No focal bone lesion or bone destruction. Bone cortex appears intact. Vascular calcifications. IMPRESSION: Degenerative changes in the right hip. No acute bony abnormalities. Electronically Signed   By: Lucienne Capers M.D.   On: 10/29/2018 20:17      LOS: 0 days   Time spent: More than 50% of that time was spent in counseling and/or coordination of care.  Antonieta Pert, MD [facility]  10/31/2018, 1:47 PM

## 2018-10-31 NOTE — Progress Notes (Signed)
Occupational Therapy Treatment Patient Details Name: Belinda Webb MRN: [id number] DOB: 03-14-1925 Today's Date: 10/31/2018    History of present illness Belinda Webb is a 85 y.o. female with medical history significant of hypertension, hyperlipidemia, CVA presenting to the hospital for evaluation of lower back pain and left hip pain after a recent fall and was found to have a pelvic fx.    OT comments  PATIENT WAS SEEN BY SKILLED OT TO MAXIMIZE I AND SAFETY WITH ADLS AND MOBILITY. PATIENT LIMITED ACTIVITY SECONDARY TO PAIN. PATIENT STATED SHE WOULD TRY TO GET UP BUT SHE COULD NOT WALK BECAUSE OF THE PAIN. PATIENT WAS AGREEABLE TO SITTING EOB. TALKED TO PATIENT SON WHO STATED THAT SHE WOULD GO TO REHAB AND THEN TO LT PLACEMENT.   Follow Up Recommendations  SNF    Equipment Recommendations       Recommendations for Other Services      Precautions / Restrictions Precautions Precautions: Fall Restrictions Weight Bearing Restrictions: No       Mobility Bed Mobility         Supine to sit: Supervision     General bed mobility comments: WITH USE OF BEDRAILS.  Transfers     Transfers: Sit to/from Stand Sit to Stand: Min assist Stand pivot transfers: Min assist       General transfer comment: PATIENT DID NOT WANT TO USE WALKER AND HELD ONTO THERAPIST FOR STAND PIVOT TRANSFER TO CHAIR.     Balance                                           ADL either performed or assessed with clinical judgement   ADL                       Lower Body Dressing: Maximal assistance Lower Body Dressing Details (indicate cue type and reason): donning socks             Functional mobility during ADLs: Minimal assistance(PATIENT PERFORMED BED TO CHAIR TRANSFER WITH HAND HELD ASSIS) General ADL Comments: PATIENT IS REQUIRING EXTENSIVE ASSIST WITH LE ADLS SECONDARY TO PAIN.     Vision       Perception     Praxis      Cognition  Arousal/Alertness: Awake/alert Behavior During Therapy: WFL for tasks assessed/performed Overall Cognitive Status: Within Functional Limits for tasks assessed                                          Exercises     Shoulder Instructions       General Comments      Pertinent Vitals/ Pain       Pain Assessment: 0-10 Pain Score: 8  Pain Location: back and hip Pain Descriptors / Indicators: Aching;Constant Pain Intervention(s): Monitored during session;Premedicated before session  Home Living                                          Prior Functioning/Environment              Frequency           Progress Toward Goals  OT Goals(current goals can  now be found in the care plan section)  Progress towards OT goals: Progressing toward goals  Acute Rehab OT Goals Patient Stated Goal: TO NOT HURT  Plan      Co-evaluation                 AM-PAC OT "6 Clicks" Daily Activity     Outcome Measure   Help from another person eating meals?: None Help from another person taking care of personal grooming?: A Little Help from another person toileting, which includes using toliet, bedpan, or urinal?: A Lot Help from another person bathing (including washing, rinsing, drying)?: A Lot Help from another person to put on and taking off regular upper body clothing?: A Little Help from another person to put on and taking off regular lower body clothing?: A Lot 6 Click Score: 16    End of Session Equipment Utilized During Treatment: Gait belt      Activity Tolerance Patient limited by pain   Patient Left in chair;with call bell/phone within reach;with chair alarm set;with family/visitor present   Nurse Communication (OK THERAPY)        Time: 1330-1400 OT Time Calculation (min): 30 min  Charges: OT General Charges $OT Visit: 1 Visit OT Treatments $Self Care/Home Management : 53-66 mins  6  CLICKS   Anab Vivar 10/31/2018, 2:15 PM

## 2018-10-31 NOTE — Progress Notes (Signed)
CSW initiated insurance auth on this day with [employer].   CSW will follow up with family with bed offers avail for SNF choice to be identified when we receive insurance auth.   CSW will continue to follow up.   [place], [place]

## 2018-11-01 DIAGNOSIS — M25552 Pain in left hip: Secondary | ICD-10-CM | POA: Diagnosis not present

## 2018-11-01 DIAGNOSIS — W19XXXA Unspecified fall, initial encounter: Secondary | ICD-10-CM

## 2018-11-01 DIAGNOSIS — G459 Transient cerebral ischemic attack, unspecified: Secondary | ICD-10-CM | POA: Diagnosis not present

## 2018-11-01 DIAGNOSIS — M2550 Pain in unspecified joint: Secondary | ICD-10-CM | POA: Diagnosis not present

## 2018-11-01 DIAGNOSIS — Z9181 History of falling: Secondary | ICD-10-CM | POA: Diagnosis not present

## 2018-11-01 DIAGNOSIS — M25551 Pain in right hip: Secondary | ICD-10-CM | POA: Diagnosis not present

## 2018-11-01 DIAGNOSIS — S3210XD Unspecified fracture of sacrum, subsequent encounter for fracture with routine healing: Secondary | ICD-10-CM | POA: Diagnosis not present

## 2018-11-01 DIAGNOSIS — M5136 Other intervertebral disc degeneration, lumbar region: Secondary | ICD-10-CM | POA: Diagnosis not present

## 2018-11-01 DIAGNOSIS — M545 Low back pain: Secondary | ICD-10-CM | POA: Diagnosis not present

## 2018-11-01 DIAGNOSIS — S3210XA Unspecified fracture of sacrum, initial encounter for closed fracture: Secondary | ICD-10-CM | POA: Diagnosis not present

## 2018-11-01 DIAGNOSIS — Z7401 Bed confinement status: Secondary | ICD-10-CM | POA: Diagnosis not present

## 2018-11-01 DIAGNOSIS — N183 Chronic kidney disease, stage 3 (moderate): Secondary | ICD-10-CM | POA: Diagnosis not present

## 2018-11-01 DIAGNOSIS — I2510 Atherosclerotic heart disease of native coronary artery without angina pectoris: Secondary | ICD-10-CM | POA: Diagnosis not present

## 2018-11-01 DIAGNOSIS — S32009A Unspecified fracture of unspecified lumbar vertebra, initial encounter for closed fracture: Secondary | ICD-10-CM | POA: Diagnosis not present

## 2018-11-01 DIAGNOSIS — K5909 Other constipation: Secondary | ICD-10-CM | POA: Diagnosis not present

## 2018-11-01 DIAGNOSIS — I6389 Other cerebral infarction: Secondary | ICD-10-CM | POA: Diagnosis not present

## 2018-11-01 DIAGNOSIS — I69321 Dysphasia following cerebral infarction: Secondary | ICD-10-CM | POA: Diagnosis not present

## 2018-11-01 DIAGNOSIS — I10 Essential (primary) hypertension: Secondary | ICD-10-CM | POA: Diagnosis not present

## 2018-11-01 DIAGNOSIS — M5416 Radiculopathy, lumbar region: Secondary | ICD-10-CM | POA: Diagnosis not present

## 2018-11-01 DIAGNOSIS — M1611 Unilateral primary osteoarthritis, right hip: Secondary | ICD-10-CM | POA: Diagnosis not present

## 2018-11-01 DIAGNOSIS — I639 Cerebral infarction, unspecified: Secondary | ICD-10-CM | POA: Diagnosis not present

## 2018-11-01 DIAGNOSIS — S32059D Unspecified fracture of fifth lumbar vertebra, subsequent encounter for fracture with routine healing: Secondary | ICD-10-CM | POA: Diagnosis not present

## 2018-11-01 DIAGNOSIS — S32029S Unspecified fracture of second lumbar vertebra, sequela: Secondary | ICD-10-CM | POA: Diagnosis not present

## 2018-11-01 MED ORDER — TAMSULOSIN HCL 0.4 MG PO CAPS: 0.4000 mg | ORAL_CAPSULE | Freq: Every day | ORAL | Status: AC

## 2018-11-01 MED ORDER — OXYCODONE HCL 5 MG PO TABS: 2.5000 mg | ORAL_TABLET | Freq: Four times a day (QID) | ORAL | 0 refills | Status: AC | PRN

## 2018-11-01 NOTE — Clinical Social Work Placement (Signed)
   CLINICAL SOCIAL WORK PLACEMENT  NOTE  Date:  11/01/2018  Patient Details  Name: Belinda Webb MRN: [id number] Date of Birth: May 20, 1925  Clinical Social Work is seeking post-discharge placement for this patient at the [place] level of care (*CSW will initial, date and re-position this form in  chart as items are completed):      Patient/family provided with [place] Work Department's list of facilities offering this level of care within the geographic area requested by the patient (or if unable, by the patient's family).  Yes   Patient/family informed of their freedom to choose among providers that offer the needed level of care, that participate in Medicare, Medicaid or managed care program needed by the patient, have an available bed and are willing to accept the patient.      Patient/family informed of [facility]'s ownership interest in [facility] and [facility], as well as of the fact that they are under no obligation to receive care at these facilities.  PASRR submitted to EDS on       PASRR number received on 10/30/18     Existing PASRR number confirmed on       FL2 transmitted to all facilities in geographic area requested by pt/family on 10/30/18     FL2 transmitted to all facilities within larger geographic area on       Patient informed that his/her managed care company has contracts with or will negotiate with certain facilities, including the following:        Yes   Patient/family informed of bed offers received.  Patient chooses bed at [facility]     Physician recommends and patient chooses bed at      Patient to be transferred to [facility] on 11/01/18.  Patient to be transferred to facility by [facility]     Patient family notified on 11/01/18 of transfer.  Name of family member notified:  Ron     PHYSICIAN       Additional Comment:    _______________________________________________ Alberteen Sam, LCSW 11/01/2018, 2:52 PM

## 2018-11-01 NOTE — Progress Notes (Signed)
CSW received notification that insurance Josem Kaufmann was obtained from [place].   CSW paged Dr. Maren Beach for DC summary and orders if patient medically ready today. Pending dc summary and orders for patient to discharge to [facility].   [place], [place]

## 2018-11-01 NOTE — Discharge Summary (Signed)
Physician Discharge Summary  Belinda Webb VVO:[id number] DOB: [birth year] DOA: 10/29/2018  PCP: Terald Sleeper, PA-C  Admit date: 10/29/2018 Discharge date: 11/01/2018  Admitted From: home Disposition: SNF  Recommendations for Outpatient Follow-up:  1. Follow up with PCP in 1-2 weeks 2. Please obtain BMP/CBC in one week 3.  Home Health: no Equipment/Devices: NONE  Discharge Condition: STABLE CODE STATUS: DNR Diet recommendation: Heart Healthy   Brief/Interim Summary: Brief Narrative: 85 year old female with history of hypertension, hyperlipidemia, CVA admitted from home with fall 3 days PTA admission.  She has been having recent falls, back in December had injured her right hip, was seen by her orthopedic and had unremarkable MRI reportedly.  In the ER CT L SPINE showed sacral and lumbar vertebral fractures, also had urine retention. Orthopedic Dr. Marlou Sa was consulted in the ER and patient was admitted.  Patient was managed with IV and oral opiates for pain control.  Seen by PT OT and has recommended skilled nursing facility.  Orthopedic surgery has recommended nonoperative management and outpatient follow-up IN 3-4 WK.  Assessment & Plan:  Low back pain/hip pain secondary to sacral and lumbar vertebral fractures from a recent mechanical fall: CT lumbar Spine with acute nondisplaced sacral fractures of bilateral ala right-sided acute on chronic, central S1-S2 bodies  ventrally, acute nondisplaced left L5 transverse process fracture (stable injury), chronic and previously augmented L2 compression fracture. Cont on IV and po opiates.Appreciate orthopedic input continue WBAT, PT/OT and follow up with orthopedic as outpatient.  Falls recent at home, continue supportive care.Cont PT/OT.She will need SNF.  Acute urinary retention, off foley. Voiding well. Cont flomax.   HTN : Blood pressure well controlled.  Continue amlodipine.   Hypokalemia resolved.  Normal anion gap metabolic  acidosis: bicarb at 21. Monitor  Elevated alkaline phosphatase, otherwise normal LFTs.  No hepatobiliary abnormality in the CT scan. GGT nl. Repeat lfts improving.    CKD stage 3: Creatinine stable.  Monitor.  Discharge Diagnoses:  Principal Problem:   Hip pain Active Problems:   Fall   HTN (hypertension)   Sacral fracture (HCC)   Lumbar vertebral fracture (HCC)   Lower back pain   Hypokalemia   Normal anion gap metabolic acidosis   Elevated alkaline phosphatase level   CKD (chronic kidney disease) stage 3, GFR 30-59 ml/min (HCC)   Back pain    Discharge Instructions  Discharge Instructions    Call MD for:  persistant nausea and vomiting   Complete by:  As directed    Call MD for:  severe uncontrolled pain   Complete by:  As directed    Diet general   Complete by:  As directed    Increase activity slowly   Complete by:  As directed      Allergies as of 11/01/2018      Reactions   Lipitor [atorvastatin Calcium] Other (See Comments)   Reaction unknown   Nitrofurantoin Monohyd Macro Other (See Comments)   Reaction unknown   Penicillins Other (See Comments)   Has patient had a PCN reaction causing immediate rash, facial/tongue/throat swelling, SOB or lightheadedness with hypotension yes Has patient had a PCN reaction causing severe rash involving mucus membranes or skin necrosis: no Has patient had a PCN reaction that required hospitalization: No Has patient had a PCN reaction occurring within the last 10 years: No If all of the above answers are "NO", then may proceed with Cephalosporin use.   Plavix [clopidogrel Bisulfate] Other (See Comments)   Reaction unknown  Statins Other (See Comments)   REACTION: effects muscles   Sulfa Antibiotics Other (See Comments)   Reaction unknown      Medication List    STOP taking these medications   triamterene-hydrochlorothiazide 37.5-25 MG tablet Commonly known as:  MAXZIDE-25     TAKE these medications   amLODipine 5  MG tablet Commonly known as:  NORVASC TAKE 1 TABLET BY MOUTH EVERY DAY   meclizine 25 MG tablet Commonly known as:  ANTIVERT Take 25 mg by mouth daily as needed for dizziness (vertigo).   oxyCODONE 5 MG immediate release tablet Commonly known as:  Oxy IR/ROXICODONE Take 0.5 tablets (2.5 mg total) by mouth every 6 (six) hours as needed for up to 8 doses for severe pain. What changed:    how much to take  when to take this  reasons to take this   tamsulosin 0.4 MG Caps capsule Commonly known as:  FLOMAX Take 1 capsule (0.4 mg total) by mouth at bedtime.       Contact information for follow-up providers    Marlou Sa, Tonna Corner, MD. Schedule an appointment as soon as possible for a visit in 3 week(s).   Specialty:  Orthopedic Surgery Contact information: [place] [address] [phone]            Contact information for after-discharge care    Destination    [facility] .   Service:  Skilled Nursing Contact information: [address] [place] [address] [phone]                 Allergies  Allergen Reactions  . Lipitor [Atorvastatin Calcium] Other (See Comments)    Reaction unknown  . Nitrofurantoin Monohyd Macro Other (See Comments)    Reaction unknown  . Penicillins Other (See Comments)    Has patient had a PCN reaction causing immediate rash, facial/tongue/throat swelling, SOB or lightheadedness with hypotension yes Has patient had a PCN reaction causing severe rash involving mucus membranes or skin necrosis: no Has patient had a PCN reaction that required hospitalization: No Has patient had a PCN reaction occurring within the last 10 years: No If all of the above answers are "NO", then may proceed with Cephalosporin use.   Marland Kitchen Plavix [Clopidogrel Bisulfate] Other (See Comments)    Reaction unknown  . Statins Other (See Comments)    REACTION: effects muscles  . Sulfa Antibiotics Other  (See Comments)    Reaction unknown    Consultations:  Orthopedic    Procedures/Studies: Ct Abdomen Pelvis Wo Contrast  Result Date: 10/29/2018 CLINICAL DATA:  85 year old female with history of left-sided lower back pain. Recent history of fall onto the right side. EXAM: CT ABDOMEN AND PELVIS WITHOUT CONTRAST TECHNIQUE: Multidetector CT imaging of the abdomen and pelvis was performed following the standard protocol without IV contrast. COMPARISON:  No priors. FINDINGS: Lower chest: Mild scarring in the lung bases. Atherosclerotic calcifications in the descending thoracic aorta and the right coronary artery. Hepatobiliary: No definite suspicious cystic or solid hepatic lesions are confidently identified on today's noncontrast CT examination. Unenhanced appearance of the gallbladder is normal. Pancreas: No definite pancreatic mass or peripancreatic fluid or inflammatory changes are noted on today's noncontrast CT examination. Spleen: Unremarkable. Adrenals/Urinary Tract: Unenhanced appearance of the kidneys and bilateral adrenal glands is normal. No hydroureteronephrosis. Urinary bladder is normal in appearance. Stomach/Bowel: Unenhanced appearance of the stomach is normal. No pathologic dilatation of small bowel or colon. Numerous colonic diverticulae are noted,  without surrounding inflammatory changes to suggest an acute diverticulitis at this time. The appendix is not confidently identified and may be surgically absent. Regardless, there are no inflammatory changes noted adjacent to the cecum to suggest the presence of an acute appendicitis at this time. Vascular/Lymphatic: Extensive atherosclerotic calcifications throughout the abdominal aorta and pelvic vasculature. No lymphadenopathy noted in the abdomen or pelvis. Reproductive: Uterus and ovaries are atrophic. Other: No significant volume of ascites.  No pneumoperitoneum. Musculoskeletal: Acute nondisplaced fracture of the left sacral ala. Chronic  compression fracture of L2 vertebral body with 60% loss of central vertebral body height and post vertebroplasty changes. Nondisplaced fractures and ill-defined areas of sclerosis in the parasymphyseal region of the right pubic bone and in the right sacral ala. There are no aggressive appearing lytic or blastic lesions noted in the visualized portions of the skeleton. IMPRESSION: 1. Acute nondisplaced fracture of the left sacral ala. Healing nondisplaced fractures of the parasymphyseal region of the right pubic bone and right sacral ala. 2. No other acute findings are noted in the abdomen or pelvis to account for the patient's symptoms. 3. Chronic compression fracture of L2 vertebral body with post vertebroplasty changes, similar to the prior study, as above. 4. Colonic diverticulosis without evidence of acute diverticulitis at this time. 5. Aortic atherosclerosis, in addition to at least right coronary artery disease. 6. Additional incidental findings, as above. Electronically Signed   By: Vinnie Langton M.D.   On: 10/29/2018 20:05   Ct L-spine No Charge  Result Date: 10/29/2018 CLINICAL DATA:  85 year old female status post fall at home with low back and left side pain. EXAM: CT LUMBAR SPINE WITHOUT CONTRAST TECHNIQUE: Technique: Multiplanar CT images of the lumbar spine were reconstructed from contemporary CT of the Abdomen and Pelvis. CONTRAST:  None. COMPARISON:  CT Abdomen and Pelvis today reported separately. Lumbar spine CT 01/30/2018. FINDINGS: Segmentation: Normal, the same numbering system on the 2019 CT. Alignment: Stable lumbar and lower thoracic alignment since 2019. New ventral impaction and anterior angulation of the central sacrum at S1-S2 (sagittal image 47). See additional details below. Vertebrae: Acute nondisplaced left sacral ala fracture best demonstrated on coronal image 34. Acute on chronic right sacral ala fracture (same image) is also nondisplaced. Mild impaction of the ventral S1-S2  cortex. The entire sacrum is not included on these images. Chronic and previously augmented L2 compression fracture is stable since 2019. No acute osseous abnormality identified in the lower thoracic spine. Likewise, the L1 through L4 levels appears stable and intact. There is a nondisplaced left L5 transverse process fracture which appears acute and is best seen on coronal image 38. The L5 vertebra otherwise appears intact. Paraspinal and other soft tissues: Reported with the CT Abdomen and Pelvis today separately. Disc levels: Stable since 2019, mild for age lumbar spine degeneration. IMPRESSION: 1. Acute nondisplaced sacral fractures affecting the bilateral ala (right side is acute on chronic) and the central S1-S2 bodies ventrally. 2. Acute nondisplaced left L5 transverse process fracture (stable injury). 3. No other acute osseous abnormality in the lumbar spine. Chronic and previously augmented L2 compression fracture. 4.  CT Abdomen and Pelvis today are reported separately. Electronically Signed   By: Genevie Ann M.D.   On: 10/29/2018 20:06   Dg Femur Min 2 Views Left  Result Date: 10/29/2018 CLINICAL DATA:  Bilateral femoral pain after a fall today. Legs gave out. EXAM: LEFT FEMUR 2 VIEWS COMPARISON:  None. FINDINGS: Mild degenerative changes in the hip and knee. No  evidence of acute fracture or dislocation of the left femur. No focal bone lesion or bone destruction. Bone cortex appears intact. Vascular calcifications. IMPRESSION: No acute bony abnormalities. Electronically Signed   By: Lucienne Capers M.D.   On: 10/29/2018 20:16   Dg Femur Min 2 Views Right  Result Date: 10/29/2018 CLINICAL DATA:  Bilateral femoral pain after a fall today. Legs gave out. EXAM: RIGHT FEMUR 2 VIEWS COMPARISON:  None. FINDINGS: Moderate degenerative changes demonstrated in the right hip with joint space narrowing and osteophyte formation on both sides of the joint. No evidence of acute fracture or dislocation. No focal  bone lesion or bone destruction. Bone cortex appears intact. Vascular calcifications. IMPRESSION: Degenerative changes in the right hip. No acute bony abnormalities. Electronically Signed   By: Lucienne Capers M.D.   On: 10/29/2018 20:17    Subjective: Seen and examined.  No new complaints.  Pain is relatively stable  Discharge Exam: Vitals:   11/01/18 0416 11/01/18 0834  BP: (!) 142/59 134/66  Pulse: (!) 56 61  Resp: 16 16  Temp: 97.7 F (36.5 C) 97.6 F (36.4 C)  SpO2: 96% 98%   Vitals:   10/31/18 1404 10/31/18 2009 11/01/18 0416 11/01/18 0834  BP: 104/63 135/62 (!) 142/59 134/66  Pulse: 61 (!) 59 (!) 56 61  Resp: 17 16 16 16   Temp: (!) 97.5 F (36.4 C)  97.7 F (36.5 C) 97.6 F (36.4 C)  TempSrc: Oral  Oral Oral  SpO2: 98% 98% 96% 98%  Weight:      Height:        General: Pt is alert, awake, not in acute distress Cardiovascular: RRR, S1/S2 +, no rubs, no gallops Respiratory: CTA bilaterally, no wheezing, no rhonchi Abdominal: Soft, NT, ND, bowel sounds + Extremities: no edema, no cyanosis   The results of significant diagnostics from this hospitalization (including imaging, microbiology, ancillary and laboratory) are listed below for reference.     Microbiology: No results found for this or any previous visit (from the past 240 hour(s)).   Labs: BNP (last 3 results) No results for input(s): BNP in the last 8760 hours. Basic Metabolic Panel: Recent Labs  Lab 10/29/18 2029 10/30/18 0049 10/31/18 0132  NA 137 134* 135  K 3.4* 3.7 4.3  CL 106 106 106  CO2 19* 20* 21*  GLUCOSE 98 100* 84  BUN 20 17 23   CREATININE 1.09* 0.95 1.04*  CALCIUM 8.6* 7.8* 7.9*  MG  --  2.0  --    Liver Function Tests: Recent Labs  Lab 10/29/18 2029 10/31/18 0132  AST 20 15  ALT 10 9  ALKPHOS 237* 187*  BILITOT 1.2 0.5  PROT 6.6 5.1*  ALBUMIN 3.4* 2.7*   Recent Labs  Lab 10/29/18 2029  LIPASE 33   No results for input(s): AMMONIA in the last 168  hours. CBC: Recent Labs  Lab 10/29/18 2029  WBC 9.4  NEUTROABS 5.3  HGB 13.6  HCT 42.7  MCV 98.4  PLT 270   Cardiac Enzymes: No results for input(s): CKTOTAL, CKMB, CKMBINDEX, TROPONINI in the last 168 hours. BNP: Invalid input(s): POCBNP CBG: No results for input(s): GLUCAP in the last 168 hours. D-Dimer No results for input(s): DDIMER in the last 72 hours. Hgb A1c No results for input(s): HGBA1C in the last 72 hours. Lipid Profile No results for input(s): CHOL, HDL, LDLCALC, TRIG, CHOLHDL, LDLDIRECT in the last 72 hours. Thyroid function studies No results for input(s): TSH, T4TOTAL, T3FREE, THYROIDAB in the  last 72 hours.  Invalid input(s): FREET3 Anemia work up No results for input(s): VITAMINB12, FOLATE, FERRITIN, TIBC, IRON, RETICCTPCT in the last 72 hours. Urinalysis    Component Value Date/Time   COLORURINE YELLOW 10/29/2018 2109   APPEARANCEUR CLEAR 10/29/2018 2109   LABSPEC 1.011 10/29/2018 2109   PHURINE 8.0 10/29/2018 2109   GLUCOSEU NEGATIVE 10/29/2018 2109   HGBUR NEGATIVE 10/29/2018 2109   BILIRUBINUR NEGATIVE 10/29/2018 2109   KETONESUR 20 (A) 10/29/2018 2109   PROTEINUR NEGATIVE 10/29/2018 2109   UROBILINOGEN 0.2 04/15/2012 1757   NITRITE NEGATIVE 10/29/2018 2109   LEUKOCYTESUR NEGATIVE 10/29/2018 2109   Sepsis Labs Invalid input(s): PROCALCITONIN,  WBC,  LACTICIDVEN Microbiology No results found for this or any previous visit (from the past 240 hour(s)).   Time coordinating discharge: 35 minutes  SIGNED:   Antonieta Pert, MD  [facility] 11/01/2018, 2:34 PM.

## 2018-11-01 NOTE — Progress Notes (Signed)
Physical Therapy Treatment Patient Details Name: Belinda Webb MRN: [id number] DOB: 1924-11-12 Today's Date: 11/01/2018    History of Present Illness Belinda Webb is a 85 y.o. female with medical history significant of hypertension, hyperlipidemia, CVA presenting to the hospital for evaluation of lower back pain and left hip pain after a recent fall and was found to have a pelvic fx.     PT Comments    Patient seen for mobility progression. Very motivated to participate with OOB mobility. Able to tolerate weight bearing this session and ambulate short distance in room with RW. Min guard throughout for all mobility for safety. Will continue to recommend SNF at discharge due to current functional status. PT to continue to follow.     Follow Up Recommendations  SNF     Equipment Recommendations  3in1 (PT);Other (comment)(may already have)    Recommendations for Other Services       Precautions / Restrictions Precautions Precautions: Fall Restrictions Weight Bearing Restrictions: No LLE Weight Bearing: Weight bearing as tolerated    Mobility  Bed Mobility Overal bed mobility: Needs Assistance       Supine to sit: Supervision     General bed mobility comments: patient stating she is ready to get out of bed; quickly comes to EOB  Transfers Overall transfer level: Needs assistance Equipment used: Rolling walker (2 wheeled) Transfers: Sit to/from [facility] Sit to Stand: Min assist;Min guard Stand pivot transfers: Min assist;Min guard       General transfer comment: very light min A - progressing to min guard for safety and immediate standing balance   Ambulation/Gait Ambulation/Gait assistance: Min guard Gait Distance (Feet): 14 Feet Assistive device: Rolling walker (2 wheeled) Gait Pattern/deviations: Step-through pattern;Decreased stride length;Antalgic;Trunk flexed Gait velocity: decreased   General Gait Details: much moer tolerable to  weight bearing with good motivation to mobilize; min guard for safety   Stairs             Wheelchair Mobility    Modified Rankin (Stroke Patients Only)       Balance Overall balance assessment: Needs assistance Sitting-balance support: No upper extremity supported;Feet supported Sitting balance-Leahy Scale: Fair     Standing balance support: Bilateral upper extremity supported;During functional activity Standing balance-Leahy Scale: Poor Standing balance comment: Reliant on UE support                            Cognition Arousal/Alertness: Awake/alert Behavior During Therapy: WFL for tasks assessed/performed Overall Cognitive Status: Within Functional Limits for tasks assessed                                        Exercises      General Comments        Pertinent Vitals/Pain Pain Assessment: Faces Faces Pain Scale: Hurts a little bit Pain Location: back and hip Pain Descriptors / Indicators: Aching;Constant Pain Intervention(s): Limited activity within patient's tolerance;Monitored during session;Repositioned    Home Living                      Prior Function            PT Goals (current goals can now be found in the care plan section) Acute Rehab PT Goals Patient Stated Goal: to not hurt PT Goal Formulation: With patient Time For Goal Achievement:  11/13/18 Potential to Achieve Goals: Fair Progress towards PT goals: Progressing toward goals    Frequency    Min 2X/week      PT Plan Current plan remains appropriate    Co-evaluation              AM-PAC PT "6 Clicks" Mobility   Outcome Measure  Help needed turning from your back to your side while in a flat bed without using bedrails?: A Little Help needed moving from lying on your back to sitting on the side of a flat bed without using bedrails?: A Little Help needed moving to and from a bed to a chair (including a wheelchair)?: A Little Help  needed standing up from a chair using your arms (e.g., wheelchair or bedside chair)?: A Little Help needed to walk in hospital room?: A Little Help needed climbing 3-5 steps with a railing? : A Lot 6 Click Score: 17    End of Session Equipment Utilized During Treatment: Gait belt Activity Tolerance: Patient tolerated treatment well Patient left: in chair;with call bell/phone within reach;with chair alarm set Nurse Communication: Mobility status PT Visit Diagnosis: Unsteadiness on feet (R26.81);Other abnormalities of gait and mobility (R26.89);Repeated falls (R29.6);Muscle weakness (generalized) (M62.81);History of falling (Z91.81);Pain Pain - Right/Left: Left Pain - part of body: Leg     Time: 1030-1045 PT Time Calculation (min) (ACUTE ONLY): 15 min  Charges:  $Gait Training: 8-22 mins                     Lanney Gins, PT, DPT Supplemental Physical Therapist 11/01/18 10:55 AM Pager: [phone] Office: [phone]

## 2018-11-01 NOTE — Progress Notes (Signed)
Patient will DC QQ:[id number] [place] Anticipated DC date: 11/01/2018 Family notified: Ron Transport by: Corey Harold  Per MD patient ready for DC to [facility] . RN, patient, patient's family, and facility notified of DC. Discharge Summary sent to facility. RN given number for report [phone] Room 210. DC packet on chart. Ambulance transport requested for patient.  CSW signing off.  [place], [place]

## 2018-11-01 NOTE — Plan of Care (Signed)

## 2018-11-01 NOTE — Progress Notes (Signed)
PROGRESS NOTE    Belinda Webb  WHQ:[id number] DOB: September 29, 1925 DOA: 10/29/2018 PCP: Theodoro Clock   Brief Narrative: 85 year old female with history of hypertension, hyperlipidemia, CVA admitted from home with fall 3 days PTA admission.  She has been having recent falls, back in December had injured her right hip, was seen by her orthopedic and had unremarkable MRI reportedly.  In the ER CT L SPINE showed sacral and lumbar vertebral fractures, also had urine retention. Orthopedic Dr. Marlou Sa was consulted in the ER and patient was admitted.  Patient was managed with IV and oral opiates for pain control.  Seen by PT OT and has recommended skilled nursing facility.  Orthopedic surgery has recommended nonoperative management and outpatient follow-up.  Assessment & Plan:  Low back pain/hip pain secondary to sacral and lumbar vertebral fractures from a recent mechanical fall: CT lumbar Spine with acute nondisplaced sacral fractures of bilateral ala right-sided acute on chronic, central S1-S2 bodies  ventrally, acute nondisplaced left L5 transverse process fracture (stable injury), chronic and previously augmented L2 compression fracture. Cont on IV and po opiates.Appreciate orthopedic input continue WBAT, PT/OT and follow up with orthopedic as outpatient.  Falls recent at home, continue supportive care.Cont PT/OT.She will need SNF.  Acute urinary retention, off foley. Voiding well. Cont flomax.  Was placed on IV fluids overnight.  Stopped today.  HTN : Blood pressure well controlled.  Continue amlodipine.   Hypokalemia resolved.  Normal anion gap metabolic acidosis: bicarb at 21. Monitor  Elevated alkaline phosphatase, otherwise normal LFTs.  No hepatobiliary abnormality in the CT scan. GGT nl. Repeat lfts improving.    CKD stage 3: Creatinine stable.  Monitor.   DVT prophylaxis:Lovenox Code Status:DNR Family Communication:Updated son at the bedside today.   Disposition  Plan:Skilled nursing facility when available. Cont on pain control for now.  Consultants: Orthopedic  Procedures: None  Antimicrobials: None  Subjective:  Seen and examined. Overall pain controlled however has pain with movement. Has nausea, vomiting, chest pain.  Objective: Vitals:   10/31/18 1404 10/31/18 2009 11/01/18 0416 11/01/18 0834  BP: 104/63 135/62 (!) 142/59 134/66  Pulse: 61 (!) 59 (!) 56 61  Resp: 17 16 16 16   Temp: (!) 97.5 F (36.4 C)  97.7 F (36.5 C) 97.6 F (36.4 C)  TempSrc: Oral  Oral Oral  SpO2: 98% 98% 96% 98%  Weight:      Height:        Intake/Output Summary (Last 24 hours) at 11/01/2018 1330 Last data filed at 11/01/2018 1100 Gross per 24 hour  Intake 957 ml  Output 600 ml  Net 357 ml   Filed Weights   10/29/18 1844 10/29/18 2325  Weight: 49.9 kg 44.7 kg   Weight change:   Body mass index is 19.25 kg/m.  Examination:  General exam: Calm, comfortable, not in acute distress,average built.  HEENT:Oral mucosa moist, Ear/Nose WNL grossly, dentition normal. Respiratory system: Bilateral equal air entry, no crackles and wheezing, no use of accessory muscle. Cardiovascular system: regular rate and rhythm, S1 & S2 heard, No JVD/murmurs.No pedal edema. Gastrointestinal system: Abdomen soft, nontender non-distended, BS +. No hepatosplenomegaly palpable. Nervous System:Alert, awake and oriented at baseline.Able to move UE and LE, sensation intact. Extremities: No edema, distal peripheral pulses palpable.  Tenderness in sacral area. Skin: No rashes,no icterus. MSK: Normal muscle bulk,tone ,power  Medications:  Scheduled Meds: . amLODipine  5 mg Oral Daily  . enoxaparin (LOVENOX) injection  30 mg Subcutaneous Q24H  .  tamsulosin  0.4 mg Oral QHS   Continuous Infusions: . sodium chloride 75 mL/hr at 11/01/18 1470    Data Reviewed: I have personally reviewed following labs and imaging studies  CBC: Recent Labs  Lab 10/29/18 2029  WBC 9.4   NEUTROABS 5.3  HGB 13.6  HCT 42.7  MCV 98.4  PLT 929   Basic Metabolic Panel: Recent Labs  Lab 10/29/18 2029 10/30/18 0049 10/31/18 0132  NA 137 134* 135  K 3.4* 3.7 4.3  CL 106 106 106  CO2 19* 20* 21*  GLUCOSE 98 100* 84  BUN 20 17 23   CREATININE 1.09* 0.95 1.04*  CALCIUM 8.6* 7.8* 7.9*  MG  --  2.0  --    GFR: Estimated Creatinine Clearance: 24.9 mL/min (A) (by C-G formula based on SCr of 1.04 mg/dL (H)). Liver Function Tests: Recent Labs  Lab 10/29/18 2029 10/31/18 0132  AST 20 15  ALT 10 9  ALKPHOS 237* 187*  BILITOT 1.2 0.5  PROT 6.6 5.1*  ALBUMIN 3.4* 2.7*   Recent Labs  Lab 10/29/18 2029  LIPASE 33   No results for input(s): AMMONIA in the last 168 hours. Coagulation Profile: No results for input(s): INR, PROTIME in the last 168 hours. Cardiac Enzymes: No results for input(s): CKTOTAL, CKMB, CKMBINDEX, TROPONINI in the last 168 hours. BNP (last 3 results) No results for input(s): PROBNP in the last 8760 hours. HbA1C: No results for input(s): HGBA1C in the last 72 hours. CBG: No results for input(s): GLUCAP in the last 168 hours. Lipid Profile: No results for input(s): CHOL, HDL, LDLCALC, TRIG, CHOLHDL, LDLDIRECT in the last 72 hours. Thyroid Function Tests: No results for input(s): TSH, T4TOTAL, FREET4, T3FREE, THYROIDAB in the last 72 hours. Anemia Panel: No results for input(s): VITAMINB12, FOLATE, FERRITIN, TIBC, IRON, RETICCTPCT in the last 72 hours. Sepsis Labs: No results for input(s): PROCALCITON, LATICACIDVEN in the last 168 hours.  No results found for this or any previous visit (from the past 240 hour(s)).    Radiology Studies: No results found.    LOS: 1 day   Time spent: More than 50% of that time was spent in counseling and/or coordination of care.  Antonieta Pert, MD [facility]  11/01/2018, 1:30 PM

## 2018-11-01 NOTE — Progress Notes (Signed)
All questions and concerns addressed, family member at bedside, called facility and gave report to [facility] [place] not in distress, to discharge to facility via [facility] with belongings.

## 2018-11-02 DIAGNOSIS — S3210XA Unspecified fracture of sacrum, initial encounter for closed fracture: Secondary | ICD-10-CM | POA: Diagnosis not present

## 2018-11-02 DIAGNOSIS — I10 Essential (primary) hypertension: Secondary | ICD-10-CM | POA: Diagnosis not present

## 2018-11-02 DIAGNOSIS — I6389 Other cerebral infarction: Secondary | ICD-10-CM | POA: Diagnosis not present

## 2018-11-02 DIAGNOSIS — S32009A Unspecified fracture of unspecified lumbar vertebra, initial encounter for closed fracture: Secondary | ICD-10-CM | POA: Diagnosis not present

## 2018-11-09 DIAGNOSIS — K5909 Other constipation: Secondary | ICD-10-CM | POA: Diagnosis not present

## 2018-11-09 DIAGNOSIS — S32009A Unspecified fracture of unspecified lumbar vertebra, initial encounter for closed fracture: Secondary | ICD-10-CM | POA: Diagnosis not present

## 2018-11-13 ENCOUNTER — Ambulatory Visit: Payer: Medicare Other | Admitting: Physician Assistant

## 2018-11-19 ENCOUNTER — Encounter (INDEPENDENT_AMBULATORY_CARE_PROVIDER_SITE_OTHER): Payer: Self-pay | Admitting: Orthopedic Surgery

## 2018-11-19 ENCOUNTER — Ambulatory Visit (INDEPENDENT_AMBULATORY_CARE_PROVIDER_SITE_OTHER): Payer: Medicare Other | Admitting: Orthopedic Surgery

## 2018-11-19 DIAGNOSIS — S3210XA Unspecified fracture of sacrum, initial encounter for closed fracture: Secondary | ICD-10-CM

## 2018-11-19 DIAGNOSIS — Z79891 Long term (current) use of opiate analgesic: Secondary | ICD-10-CM | POA: Diagnosis not present

## 2018-11-19 DIAGNOSIS — F330 Major depressive disorder, recurrent, mild: Secondary | ICD-10-CM | POA: Diagnosis not present

## 2018-11-19 DIAGNOSIS — F064 Anxiety disorder due to known physiological condition: Secondary | ICD-10-CM | POA: Diagnosis not present

## 2018-11-20 ENCOUNTER — Encounter (INDEPENDENT_AMBULATORY_CARE_PROVIDER_SITE_OTHER): Payer: Self-pay | Admitting: Orthopedic Surgery

## 2018-11-20 NOTE — Progress Notes (Signed)
Office Visit Note   Patient: Belinda Webb           Date of Birth: Mar 24, 1925           MRN: [id number] Visit Date: 11/19/2018 Requested by: Terald Sleeper, PA-C [place], [place] [id number] PCP: Theodoro Clock  Subjective: Chief Complaint  Patient presents with  . Lower Back - Pain  . Right Hip - Pain    HPI: [name] is a patient who sustained a fall 10/29/2018.  She is about 2 weeks out from the fall now.  She has been doing some weightbearing.  Diagnosed with sacral fracture in the hospital.  Taking oxycodone.  She is able to walk to the bathroom with a walker.  Reports continued pain.  She also has a history of old compression fractures in the lumbar spine.  The sacral fracture on the left is new.              ROS: All systems reviewed are negative as they relate to the chief complaint within the history of present illness.  Patient denies  fevers or chills.   Assessment & Plan: Visit Diagnoses: No diagnosis found.  Plan: Impression is left-sided pain from sacral fracture.  No groin pain on internal/external rotation today.  Continue with weightbearing as tolerated with walker.  She does have a little bit of pain with internal/external rotation and movement of that left leg compared to the right.  Ankle dorsiflexion plantarflexion strength is intact.  She need some x-rays on return visit.  Follow-Up Instructions: No follow-ups on file.   Orders:  No orders of the defined types were placed in this encounter.  No orders of the defined types were placed in this encounter.     Procedures: No procedures performed   Clinical Data: No additional findings.  Objective: Vital Signs: There were no vitals taken for this visit.  Physical Exam:   Constitutional: Patient appears well-developed HEENT:  Head: Normocephalic Eyes:EOM are normal Neck: Normal range of motion Cardiovascular: Normal rate Pulmonary/chest: Effort normal Neurologic: Patient is  alert Skin: Skin is warm Psychiatric: Patient has normal mood and affect    Ortho Exam: Ortho exam demonstrates good ankle dorsiflexion plantarflexion strength with palpable pedal pulses.  A little bit of pain with circumduction of that left leg compared to the right.  Knee and ankle range of motion nontender with no crepitus or effusion in either the joints.  No definite paresthesias L1 S1 bilaterally.  No saddle paresthesias.  Specialty Comments:  No specialty comments available.  Imaging: No results found.   PMFS History: Patient Active Problem List   Diagnosis Date Noted  . Back pain 10/31/2018  . Hip pain 10/29/2018  . Sacral fracture ([place]) 10/29/2018  . Lumbar vertebral fracture ([place]) 10/29/2018  . Lower back pain 10/29/2018  . Hypokalemia 10/29/2018  . Normal anion gap metabolic acidosis 04/09/7627  . Elevated alkaline phosphatase level 10/29/2018  . CKD (chronic kidney disease) stage 3, GFR 30-59 ml/min (HCC) 10/29/2018  . DDD (degenerative disc disease), lumbar 05/15/2017  . Lumbar pain with radiation down both legs 04/11/2017  . Weakness of both lower extremities 04/11/2017  . History of fall 04/11/2017  . Risk for falls 04/11/2017  . BMI less than 19,adult 05/06/2015  . Cancer of chest (wall) ([place]) 01/21/2015  . HTN (hypertension) 04/16/2012  . Fall 04/15/2012  . Hoarseness 11/22/2011  . Esophageal dysmotility 11/22/2011  . Chronic cough   . Stroke (  [place])    Past Medical History:  Diagnosis Date  . Chronic cough   . HLD (hyperlipidemia)   . HTN (hypertension)   . Lung tumor 1984   LUL  . Stroke ([place])   . Vertigo     Family History  Problem Relation Age of Onset  . Diabetes Father   . Hypertension Father   . Hearing loss Other   . Stroke Mother   . Tuberculosis Sister   . Emphysema Brother   . Lung cancer Brother   . Colon polyps Son   . Colon cancer Neg Hx     Past Surgical History:  Procedure Laterality Date  . APPENDECTOMY  1938  . BACK  SURGERY    . BREAST BIOPSY     left  . CATARACT EXTRACTION, BILATERAL    . LUNG SURGERY  1984   LUL   . TONSILLECTOMY AND ADENOIDECTOMY    . TOTAL ABDOMINAL HYSTERECTOMY  1947   Social History   Occupational History  . Occupation: retired  Tobacco Use  . Smoking status: Former Smoker    Packs/day: 0.50    Years: 40.00    Pack years: 20.00    Types: Cigarettes    Last attempt to quit: 10/17/1982    Years since quitting: 36.1  . Smokeless tobacco: Never Used  Substance and Sexual Activity  . Alcohol use: No  . Drug use: No  . Sexual activity: Not on file

## 2018-11-28 DIAGNOSIS — I129 Hypertensive chronic kidney disease with stage 1 through stage 4 chronic kidney disease, or unspecified chronic kidney disease: Secondary | ICD-10-CM | POA: Diagnosis not present

## 2018-11-28 DIAGNOSIS — K219 Gastro-esophageal reflux disease without esophagitis: Secondary | ICD-10-CM | POA: Diagnosis not present

## 2018-11-28 DIAGNOSIS — D6489 Other specified anemias: Secondary | ICD-10-CM | POA: Diagnosis not present

## 2018-11-28 DIAGNOSIS — F320 Major depressive disorder, single episode, mild: Secondary | ICD-10-CM | POA: Diagnosis not present

## 2018-11-30 DIAGNOSIS — F330 Major depressive disorder, recurrent, mild: Secondary | ICD-10-CM | POA: Diagnosis not present

## 2018-12-02 DIAGNOSIS — M5136 Other intervertebral disc degeneration, lumbar region: Secondary | ICD-10-CM | POA: Diagnosis not present

## 2018-12-03 DIAGNOSIS — M25552 Pain in left hip: Secondary | ICD-10-CM | POA: Diagnosis not present

## 2018-12-03 DIAGNOSIS — S32010A Wedge compression fracture of first lumbar vertebra, initial encounter for closed fracture: Secondary | ICD-10-CM | POA: Diagnosis not present

## 2018-12-03 DIAGNOSIS — S0083XA Contusion of other part of head, initial encounter: Secondary | ICD-10-CM | POA: Diagnosis not present

## 2018-12-03 DIAGNOSIS — S299XXA Unspecified injury of thorax, initial encounter: Secondary | ICD-10-CM | POA: Diagnosis not present

## 2018-12-03 DIAGNOSIS — S3992XA Unspecified injury of lower back, initial encounter: Secondary | ICD-10-CM | POA: Diagnosis not present

## 2018-12-03 DIAGNOSIS — Y9301 Activity, walking, marching and hiking: Secondary | ICD-10-CM | POA: Diagnosis not present

## 2018-12-03 DIAGNOSIS — R0902 Hypoxemia: Secondary | ICD-10-CM | POA: Diagnosis not present

## 2018-12-03 DIAGNOSIS — S79912A Unspecified injury of left hip, initial encounter: Secondary | ICD-10-CM | POA: Diagnosis not present

## 2018-12-03 DIAGNOSIS — Z7401 Bed confinement status: Secondary | ICD-10-CM | POA: Diagnosis not present

## 2018-12-03 DIAGNOSIS — R220 Localized swelling, mass and lump, head: Secondary | ICD-10-CM | POA: Diagnosis not present

## 2018-12-03 DIAGNOSIS — S79911A Unspecified injury of right hip, initial encounter: Secondary | ICD-10-CM | POA: Diagnosis not present

## 2018-12-03 DIAGNOSIS — S0990XA Unspecified injury of head, initial encounter: Secondary | ICD-10-CM | POA: Diagnosis not present

## 2018-12-03 DIAGNOSIS — S199XXA Unspecified injury of neck, initial encounter: Secondary | ICD-10-CM | POA: Diagnosis not present

## 2018-12-03 DIAGNOSIS — W1839XA Other fall on same level, initial encounter: Secondary | ICD-10-CM | POA: Diagnosis not present

## 2018-12-03 DIAGNOSIS — M25551 Pain in right hip: Secondary | ICD-10-CM | POA: Diagnosis not present

## 2018-12-03 DIAGNOSIS — I10 Essential (primary) hypertension: Secondary | ICD-10-CM | POA: Diagnosis not present

## 2018-12-03 DIAGNOSIS — R0689 Other abnormalities of breathing: Secondary | ICD-10-CM | POA: Diagnosis not present

## 2018-12-03 DIAGNOSIS — M2550 Pain in unspecified joint: Secondary | ICD-10-CM | POA: Diagnosis not present

## 2018-12-03 DIAGNOSIS — R52 Pain, unspecified: Secondary | ICD-10-CM | POA: Diagnosis not present

## 2018-12-03 DIAGNOSIS — S0003XA Contusion of scalp, initial encounter: Secondary | ICD-10-CM | POA: Diagnosis not present

## 2018-12-05 DIAGNOSIS — L988 Other specified disorders of the skin and subcutaneous tissue: Secondary | ICD-10-CM | POA: Diagnosis not present

## 2018-12-15 DIAGNOSIS — F064 Anxiety disorder due to known physiological condition: Secondary | ICD-10-CM | POA: Diagnosis not present

## 2018-12-17 ENCOUNTER — Ambulatory Visit (INDEPENDENT_AMBULATORY_CARE_PROVIDER_SITE_OTHER): Payer: Medicare Other | Admitting: Orthopedic Surgery

## 2018-12-17 DIAGNOSIS — F064 Anxiety disorder due to known physiological condition: Secondary | ICD-10-CM | POA: Diagnosis not present

## 2018-12-17 DIAGNOSIS — F330 Major depressive disorder, recurrent, mild: Secondary | ICD-10-CM | POA: Diagnosis not present

## 2018-12-19 DIAGNOSIS — R4189 Other symptoms and signs involving cognitive functions and awareness: Secondary | ICD-10-CM | POA: Diagnosis not present

## 2018-12-20 DIAGNOSIS — R319 Hematuria, unspecified: Secondary | ICD-10-CM | POA: Diagnosis not present

## 2018-12-20 DIAGNOSIS — Z299 Encounter for prophylactic measures, unspecified: Secondary | ICD-10-CM | POA: Diagnosis not present

## 2018-12-20 DIAGNOSIS — N390 Urinary tract infection, site not specified: Secondary | ICD-10-CM | POA: Diagnosis not present

## 2018-12-21 DIAGNOSIS — I6389 Other cerebral infarction: Secondary | ICD-10-CM | POA: Diagnosis not present

## 2018-12-21 DIAGNOSIS — I700 Atherosclerosis of aorta: Secondary | ICD-10-CM | POA: Diagnosis not present

## 2018-12-21 DIAGNOSIS — N183 Chronic kidney disease, stage 3 (moderate): Secondary | ICD-10-CM | POA: Diagnosis not present

## 2018-12-21 DIAGNOSIS — I10 Essential (primary) hypertension: Secondary | ICD-10-CM | POA: Diagnosis not present

## 2018-12-31 DIAGNOSIS — F064 Anxiety disorder due to known physiological condition: Secondary | ICD-10-CM | POA: Diagnosis not present

## 2018-12-31 DIAGNOSIS — F330 Major depressive disorder, recurrent, mild: Secondary | ICD-10-CM | POA: Diagnosis not present

## 2019-01-14 DIAGNOSIS — F330 Major depressive disorder, recurrent, mild: Secondary | ICD-10-CM | POA: Diagnosis not present

## 2019-01-14 DIAGNOSIS — F064 Anxiety disorder due to known physiological condition: Secondary | ICD-10-CM | POA: Diagnosis not present

## 2019-01-23 DIAGNOSIS — S32010D Wedge compression fracture of first lumbar vertebra, subsequent encounter for fracture with routine healing: Secondary | ICD-10-CM | POA: Diagnosis not present

## 2019-01-23 DIAGNOSIS — W19XXXD Unspecified fall, subsequent encounter: Secondary | ICD-10-CM | POA: Diagnosis not present

## 2019-01-23 DIAGNOSIS — S32009A Unspecified fracture of unspecified lumbar vertebra, initial encounter for closed fracture: Secondary | ICD-10-CM | POA: Diagnosis not present

## 2019-01-23 DIAGNOSIS — N3281 Overactive bladder: Secondary | ICD-10-CM | POA: Diagnosis not present

## 2019-01-23 DIAGNOSIS — K219 Gastro-esophageal reflux disease without esophagitis: Secondary | ICD-10-CM | POA: Diagnosis not present

## 2019-01-23 DIAGNOSIS — I10 Essential (primary) hypertension: Secondary | ICD-10-CM | POA: Diagnosis not present

## 2019-01-28 DIAGNOSIS — K5909 Other constipation: Secondary | ICD-10-CM | POA: Diagnosis not present

## 2019-01-28 DIAGNOSIS — S32009D Unspecified fracture of unspecified lumbar vertebra, subsequent encounter for fracture with routine healing: Secondary | ICD-10-CM | POA: Diagnosis not present

## 2019-01-30 DIAGNOSIS — Z9181 History of falling: Secondary | ICD-10-CM | POA: Diagnosis not present

## 2019-01-30 DIAGNOSIS — S32029S Unspecified fracture of second lumbar vertebra, sequela: Secondary | ICD-10-CM | POA: Diagnosis not present

## 2019-01-30 DIAGNOSIS — R2689 Other abnormalities of gait and mobility: Secondary | ICD-10-CM | POA: Diagnosis not present

## 2019-01-30 DIAGNOSIS — S32059D Unspecified fracture of fifth lumbar vertebra, subsequent encounter for fracture with routine healing: Secondary | ICD-10-CM | POA: Diagnosis not present

## 2019-01-30 DIAGNOSIS — M6281 Muscle weakness (generalized): Secondary | ICD-10-CM | POA: Diagnosis not present

## 2019-01-31 DIAGNOSIS — S32029S Unspecified fracture of second lumbar vertebra, sequela: Secondary | ICD-10-CM | POA: Diagnosis not present

## 2019-01-31 DIAGNOSIS — S32059D Unspecified fracture of fifth lumbar vertebra, subsequent encounter for fracture with routine healing: Secondary | ICD-10-CM | POA: Diagnosis not present

## 2019-01-31 DIAGNOSIS — R2689 Other abnormalities of gait and mobility: Secondary | ICD-10-CM | POA: Diagnosis not present

## 2019-01-31 DIAGNOSIS — Z9181 History of falling: Secondary | ICD-10-CM | POA: Diagnosis not present

## 2019-01-31 DIAGNOSIS — M6281 Muscle weakness (generalized): Secondary | ICD-10-CM | POA: Diagnosis not present

## 2019-02-01 DIAGNOSIS — R2689 Other abnormalities of gait and mobility: Secondary | ICD-10-CM | POA: Diagnosis not present

## 2019-02-01 DIAGNOSIS — Z9181 History of falling: Secondary | ICD-10-CM | POA: Diagnosis not present

## 2019-02-01 DIAGNOSIS — S32059D Unspecified fracture of fifth lumbar vertebra, subsequent encounter for fracture with routine healing: Secondary | ICD-10-CM | POA: Diagnosis not present

## 2019-02-01 DIAGNOSIS — M6281 Muscle weakness (generalized): Secondary | ICD-10-CM | POA: Diagnosis not present

## 2019-02-01 DIAGNOSIS — S32029S Unspecified fracture of second lumbar vertebra, sequela: Secondary | ICD-10-CM | POA: Diagnosis not present

## 2019-02-04 DIAGNOSIS — S32029S Unspecified fracture of second lumbar vertebra, sequela: Secondary | ICD-10-CM | POA: Diagnosis not present

## 2019-02-04 DIAGNOSIS — Z9181 History of falling: Secondary | ICD-10-CM | POA: Diagnosis not present

## 2019-02-04 DIAGNOSIS — S32059D Unspecified fracture of fifth lumbar vertebra, subsequent encounter for fracture with routine healing: Secondary | ICD-10-CM | POA: Diagnosis not present

## 2019-02-04 DIAGNOSIS — R2689 Other abnormalities of gait and mobility: Secondary | ICD-10-CM | POA: Diagnosis not present

## 2019-02-04 DIAGNOSIS — M6281 Muscle weakness (generalized): Secondary | ICD-10-CM | POA: Diagnosis not present

## 2019-02-05 DIAGNOSIS — S32059D Unspecified fracture of fifth lumbar vertebra, subsequent encounter for fracture with routine healing: Secondary | ICD-10-CM | POA: Diagnosis not present

## 2019-02-05 DIAGNOSIS — Z9181 History of falling: Secondary | ICD-10-CM | POA: Diagnosis not present

## 2019-02-05 DIAGNOSIS — M6281 Muscle weakness (generalized): Secondary | ICD-10-CM | POA: Diagnosis not present

## 2019-02-05 DIAGNOSIS — S32029S Unspecified fracture of second lumbar vertebra, sequela: Secondary | ICD-10-CM | POA: Diagnosis not present

## 2019-02-05 DIAGNOSIS — R2689 Other abnormalities of gait and mobility: Secondary | ICD-10-CM | POA: Diagnosis not present

## 2019-02-06 DIAGNOSIS — M6281 Muscle weakness (generalized): Secondary | ICD-10-CM | POA: Diagnosis not present

## 2019-02-06 DIAGNOSIS — S32059D Unspecified fracture of fifth lumbar vertebra, subsequent encounter for fracture with routine healing: Secondary | ICD-10-CM | POA: Diagnosis not present

## 2019-02-06 DIAGNOSIS — S32029S Unspecified fracture of second lumbar vertebra, sequela: Secondary | ICD-10-CM | POA: Diagnosis not present

## 2019-02-06 DIAGNOSIS — R2689 Other abnormalities of gait and mobility: Secondary | ICD-10-CM | POA: Diagnosis not present

## 2019-02-06 DIAGNOSIS — Z9181 History of falling: Secondary | ICD-10-CM | POA: Diagnosis not present

## 2019-02-07 DIAGNOSIS — S32009D Unspecified fracture of unspecified lumbar vertebra, subsequent encounter for fracture with routine healing: Secondary | ICD-10-CM | POA: Diagnosis not present

## 2019-02-07 DIAGNOSIS — M6281 Muscle weakness (generalized): Secondary | ICD-10-CM | POA: Diagnosis not present

## 2019-02-07 DIAGNOSIS — S32029S Unspecified fracture of second lumbar vertebra, sequela: Secondary | ICD-10-CM | POA: Diagnosis not present

## 2019-02-07 DIAGNOSIS — Z9181 History of falling: Secondary | ICD-10-CM | POA: Diagnosis not present

## 2019-02-07 DIAGNOSIS — S32059D Unspecified fracture of fifth lumbar vertebra, subsequent encounter for fracture with routine healing: Secondary | ICD-10-CM | POA: Diagnosis not present

## 2019-02-07 DIAGNOSIS — I10 Essential (primary) hypertension: Secondary | ICD-10-CM | POA: Diagnosis not present

## 2019-02-07 DIAGNOSIS — N183 Chronic kidney disease, stage 3 (moderate): Secondary | ICD-10-CM | POA: Diagnosis not present

## 2019-02-07 DIAGNOSIS — R2689 Other abnormalities of gait and mobility: Secondary | ICD-10-CM | POA: Diagnosis not present

## 2019-02-08 DIAGNOSIS — S32029S Unspecified fracture of second lumbar vertebra, sequela: Secondary | ICD-10-CM | POA: Diagnosis not present

## 2019-02-08 DIAGNOSIS — S32059D Unspecified fracture of fifth lumbar vertebra, subsequent encounter for fracture with routine healing: Secondary | ICD-10-CM | POA: Diagnosis not present

## 2019-02-08 DIAGNOSIS — Z9181 History of falling: Secondary | ICD-10-CM | POA: Diagnosis not present

## 2019-02-08 DIAGNOSIS — M6281 Muscle weakness (generalized): Secondary | ICD-10-CM | POA: Diagnosis not present

## 2019-02-08 DIAGNOSIS — R2689 Other abnormalities of gait and mobility: Secondary | ICD-10-CM | POA: Diagnosis not present

## 2019-02-09 DIAGNOSIS — M6281 Muscle weakness (generalized): Secondary | ICD-10-CM | POA: Diagnosis not present

## 2019-02-09 DIAGNOSIS — Z9181 History of falling: Secondary | ICD-10-CM | POA: Diagnosis not present

## 2019-02-09 DIAGNOSIS — S32029S Unspecified fracture of second lumbar vertebra, sequela: Secondary | ICD-10-CM | POA: Diagnosis not present

## 2019-02-09 DIAGNOSIS — S32059D Unspecified fracture of fifth lumbar vertebra, subsequent encounter for fracture with routine healing: Secondary | ICD-10-CM | POA: Diagnosis not present

## 2019-02-09 DIAGNOSIS — R2689 Other abnormalities of gait and mobility: Secondary | ICD-10-CM | POA: Diagnosis not present

## 2019-02-11 DIAGNOSIS — R2689 Other abnormalities of gait and mobility: Secondary | ICD-10-CM | POA: Diagnosis not present

## 2019-02-11 DIAGNOSIS — S32059D Unspecified fracture of fifth lumbar vertebra, subsequent encounter for fracture with routine healing: Secondary | ICD-10-CM | POA: Diagnosis not present

## 2019-02-11 DIAGNOSIS — Z9181 History of falling: Secondary | ICD-10-CM | POA: Diagnosis not present

## 2019-02-11 DIAGNOSIS — M6281 Muscle weakness (generalized): Secondary | ICD-10-CM | POA: Diagnosis not present

## 2019-02-11 DIAGNOSIS — S32029S Unspecified fracture of second lumbar vertebra, sequela: Secondary | ICD-10-CM | POA: Diagnosis not present

## 2019-02-12 DIAGNOSIS — S32029S Unspecified fracture of second lumbar vertebra, sequela: Secondary | ICD-10-CM | POA: Diagnosis not present

## 2019-02-12 DIAGNOSIS — Z9181 History of falling: Secondary | ICD-10-CM | POA: Diagnosis not present

## 2019-02-12 DIAGNOSIS — R2689 Other abnormalities of gait and mobility: Secondary | ICD-10-CM | POA: Diagnosis not present

## 2019-02-12 DIAGNOSIS — F330 Major depressive disorder, recurrent, mild: Secondary | ICD-10-CM | POA: Diagnosis not present

## 2019-02-12 DIAGNOSIS — S32059D Unspecified fracture of fifth lumbar vertebra, subsequent encounter for fracture with routine healing: Secondary | ICD-10-CM | POA: Diagnosis not present

## 2019-02-12 DIAGNOSIS — F064 Anxiety disorder due to known physiological condition: Secondary | ICD-10-CM | POA: Diagnosis not present

## 2019-02-12 DIAGNOSIS — M6281 Muscle weakness (generalized): Secondary | ICD-10-CM | POA: Diagnosis not present

## 2019-02-13 DIAGNOSIS — S32059D Unspecified fracture of fifth lumbar vertebra, subsequent encounter for fracture with routine healing: Secondary | ICD-10-CM | POA: Diagnosis not present

## 2019-02-13 DIAGNOSIS — R2689 Other abnormalities of gait and mobility: Secondary | ICD-10-CM | POA: Diagnosis not present

## 2019-02-13 DIAGNOSIS — S32029S Unspecified fracture of second lumbar vertebra, sequela: Secondary | ICD-10-CM | POA: Diagnosis not present

## 2019-02-13 DIAGNOSIS — M6281 Muscle weakness (generalized): Secondary | ICD-10-CM | POA: Diagnosis not present

## 2019-02-13 DIAGNOSIS — Z9181 History of falling: Secondary | ICD-10-CM | POA: Diagnosis not present

## 2019-02-14 DIAGNOSIS — M6281 Muscle weakness (generalized): Secondary | ICD-10-CM | POA: Diagnosis not present

## 2019-02-14 DIAGNOSIS — S32029S Unspecified fracture of second lumbar vertebra, sequela: Secondary | ICD-10-CM | POA: Diagnosis not present

## 2019-02-14 DIAGNOSIS — Z9181 History of falling: Secondary | ICD-10-CM | POA: Diagnosis not present

## 2019-02-14 DIAGNOSIS — S32059D Unspecified fracture of fifth lumbar vertebra, subsequent encounter for fracture with routine healing: Secondary | ICD-10-CM | POA: Diagnosis not present

## 2019-02-14 DIAGNOSIS — R2689 Other abnormalities of gait and mobility: Secondary | ICD-10-CM | POA: Diagnosis not present

## 2019-02-15 DIAGNOSIS — S32029S Unspecified fracture of second lumbar vertebra, sequela: Secondary | ICD-10-CM | POA: Diagnosis not present

## 2019-02-15 DIAGNOSIS — M6281 Muscle weakness (generalized): Secondary | ICD-10-CM | POA: Diagnosis not present

## 2019-02-15 DIAGNOSIS — R2689 Other abnormalities of gait and mobility: Secondary | ICD-10-CM | POA: Diagnosis not present

## 2019-02-15 DIAGNOSIS — S3210XD Unspecified fracture of sacrum, subsequent encounter for fracture with routine healing: Secondary | ICD-10-CM | POA: Diagnosis not present

## 2019-02-15 DIAGNOSIS — Z9181 History of falling: Secondary | ICD-10-CM | POA: Diagnosis not present

## 2019-02-15 DIAGNOSIS — S32059D Unspecified fracture of fifth lumbar vertebra, subsequent encounter for fracture with routine healing: Secondary | ICD-10-CM | POA: Diagnosis not present

## 2019-02-18 DIAGNOSIS — S3210XD Unspecified fracture of sacrum, subsequent encounter for fracture with routine healing: Secondary | ICD-10-CM | POA: Diagnosis not present

## 2019-02-18 DIAGNOSIS — S32029S Unspecified fracture of second lumbar vertebra, sequela: Secondary | ICD-10-CM | POA: Diagnosis not present

## 2019-02-18 DIAGNOSIS — Z9181 History of falling: Secondary | ICD-10-CM | POA: Diagnosis not present

## 2019-02-18 DIAGNOSIS — R2689 Other abnormalities of gait and mobility: Secondary | ICD-10-CM | POA: Diagnosis not present

## 2019-02-18 DIAGNOSIS — M6281 Muscle weakness (generalized): Secondary | ICD-10-CM | POA: Diagnosis not present

## 2019-02-18 DIAGNOSIS — S32059D Unspecified fracture of fifth lumbar vertebra, subsequent encounter for fracture with routine healing: Secondary | ICD-10-CM | POA: Diagnosis not present

## 2019-02-19 DIAGNOSIS — S3210XD Unspecified fracture of sacrum, subsequent encounter for fracture with routine healing: Secondary | ICD-10-CM | POA: Diagnosis not present

## 2019-02-19 DIAGNOSIS — S32059D Unspecified fracture of fifth lumbar vertebra, subsequent encounter for fracture with routine healing: Secondary | ICD-10-CM | POA: Diagnosis not present

## 2019-02-19 DIAGNOSIS — M6281 Muscle weakness (generalized): Secondary | ICD-10-CM | POA: Diagnosis not present

## 2019-02-19 DIAGNOSIS — S32029S Unspecified fracture of second lumbar vertebra, sequela: Secondary | ICD-10-CM | POA: Diagnosis not present

## 2019-02-19 DIAGNOSIS — R2689 Other abnormalities of gait and mobility: Secondary | ICD-10-CM | POA: Diagnosis not present

## 2019-02-19 DIAGNOSIS — Z9181 History of falling: Secondary | ICD-10-CM | POA: Diagnosis not present

## 2019-02-20 DIAGNOSIS — M6281 Muscle weakness (generalized): Secondary | ICD-10-CM | POA: Diagnosis not present

## 2019-02-20 DIAGNOSIS — S32029S Unspecified fracture of second lumbar vertebra, sequela: Secondary | ICD-10-CM | POA: Diagnosis not present

## 2019-02-20 DIAGNOSIS — S3210XD Unspecified fracture of sacrum, subsequent encounter for fracture with routine healing: Secondary | ICD-10-CM | POA: Diagnosis not present

## 2019-02-20 DIAGNOSIS — R2689 Other abnormalities of gait and mobility: Secondary | ICD-10-CM | POA: Diagnosis not present

## 2019-02-20 DIAGNOSIS — S32059D Unspecified fracture of fifth lumbar vertebra, subsequent encounter for fracture with routine healing: Secondary | ICD-10-CM | POA: Diagnosis not present

## 2019-02-20 DIAGNOSIS — Z9181 History of falling: Secondary | ICD-10-CM | POA: Diagnosis not present

## 2019-02-21 DIAGNOSIS — M6281 Muscle weakness (generalized): Secondary | ICD-10-CM | POA: Diagnosis not present

## 2019-02-21 DIAGNOSIS — S32029S Unspecified fracture of second lumbar vertebra, sequela: Secondary | ICD-10-CM | POA: Diagnosis not present

## 2019-02-21 DIAGNOSIS — S32059D Unspecified fracture of fifth lumbar vertebra, subsequent encounter for fracture with routine healing: Secondary | ICD-10-CM | POA: Diagnosis not present

## 2019-02-21 DIAGNOSIS — Z9181 History of falling: Secondary | ICD-10-CM | POA: Diagnosis not present

## 2019-02-21 DIAGNOSIS — S3210XD Unspecified fracture of sacrum, subsequent encounter for fracture with routine healing: Secondary | ICD-10-CM | POA: Diagnosis not present

## 2019-02-21 DIAGNOSIS — R2689 Other abnormalities of gait and mobility: Secondary | ICD-10-CM | POA: Diagnosis not present

## 2019-02-26 DIAGNOSIS — F064 Anxiety disorder due to known physiological condition: Secondary | ICD-10-CM | POA: Diagnosis not present

## 2019-02-26 DIAGNOSIS — F330 Major depressive disorder, recurrent, mild: Secondary | ICD-10-CM | POA: Diagnosis not present

## 2019-02-28 DIAGNOSIS — S32010D Wedge compression fracture of first lumbar vertebra, subsequent encounter for fracture with routine healing: Secondary | ICD-10-CM | POA: Diagnosis not present

## 2019-03-12 DIAGNOSIS — F330 Major depressive disorder, recurrent, mild: Secondary | ICD-10-CM | POA: Diagnosis not present

## 2019-03-12 DIAGNOSIS — F064 Anxiety disorder due to known physiological condition: Secondary | ICD-10-CM | POA: Diagnosis not present

## 2019-03-13 DIAGNOSIS — M5134 Other intervertebral disc degeneration, thoracic region: Secondary | ICD-10-CM | POA: Diagnosis not present

## 2019-03-13 DIAGNOSIS — M4856XA Collapsed vertebra, not elsewhere classified, lumbar region, initial encounter for fracture: Secondary | ICD-10-CM | POA: Diagnosis not present

## 2019-03-17 DIAGNOSIS — F064 Anxiety disorder due to known physiological condition: Secondary | ICD-10-CM | POA: Diagnosis not present

## 2019-03-17 DIAGNOSIS — F330 Major depressive disorder, recurrent, mild: Secondary | ICD-10-CM | POA: Diagnosis not present

## 2019-03-25 DIAGNOSIS — I10 Essential (primary) hypertension: Secondary | ICD-10-CM | POA: Diagnosis not present

## 2019-03-25 DIAGNOSIS — I639 Cerebral infarction, unspecified: Secondary | ICD-10-CM | POA: Diagnosis not present

## 2019-03-25 DIAGNOSIS — S3210XA Unspecified fracture of sacrum, initial encounter for closed fracture: Secondary | ICD-10-CM | POA: Diagnosis not present

## 2019-03-25 DIAGNOSIS — S32009A Unspecified fracture of unspecified lumbar vertebra, initial encounter for closed fracture: Secondary | ICD-10-CM | POA: Diagnosis not present

## 2019-04-08 DIAGNOSIS — F330 Major depressive disorder, recurrent, mild: Secondary | ICD-10-CM | POA: Diagnosis not present

## 2019-04-08 DIAGNOSIS — F064 Anxiety disorder due to known physiological condition: Secondary | ICD-10-CM | POA: Diagnosis not present

## 2019-04-16 DIAGNOSIS — F330 Major depressive disorder, recurrent, mild: Secondary | ICD-10-CM | POA: Diagnosis not present

## 2019-04-16 DIAGNOSIS — F064 Anxiety disorder due to known physiological condition: Secondary | ICD-10-CM | POA: Diagnosis not present

## 2019-04-23 DIAGNOSIS — F064 Anxiety disorder due to known physiological condition: Secondary | ICD-10-CM | POA: Diagnosis not present

## 2019-04-23 DIAGNOSIS — F330 Major depressive disorder, recurrent, mild: Secondary | ICD-10-CM | POA: Diagnosis not present

## 2019-05-07 DIAGNOSIS — F064 Anxiety disorder due to known physiological condition: Secondary | ICD-10-CM | POA: Diagnosis not present

## 2019-05-07 DIAGNOSIS — F331 Major depressive disorder, recurrent, moderate: Secondary | ICD-10-CM | POA: Diagnosis not present

## 2019-05-07 IMAGING — DX DG FEMUR 2+V*L*
4 series · 4 of 4 positions shown · non-contrast
Comparison: None.

CLINICAL DATA: Bilateral femoral pain after a fall today. Legs gave
out.

EXAM:
LEFT FEMUR 2 VIEWS

[femur ap (1 of 2)]
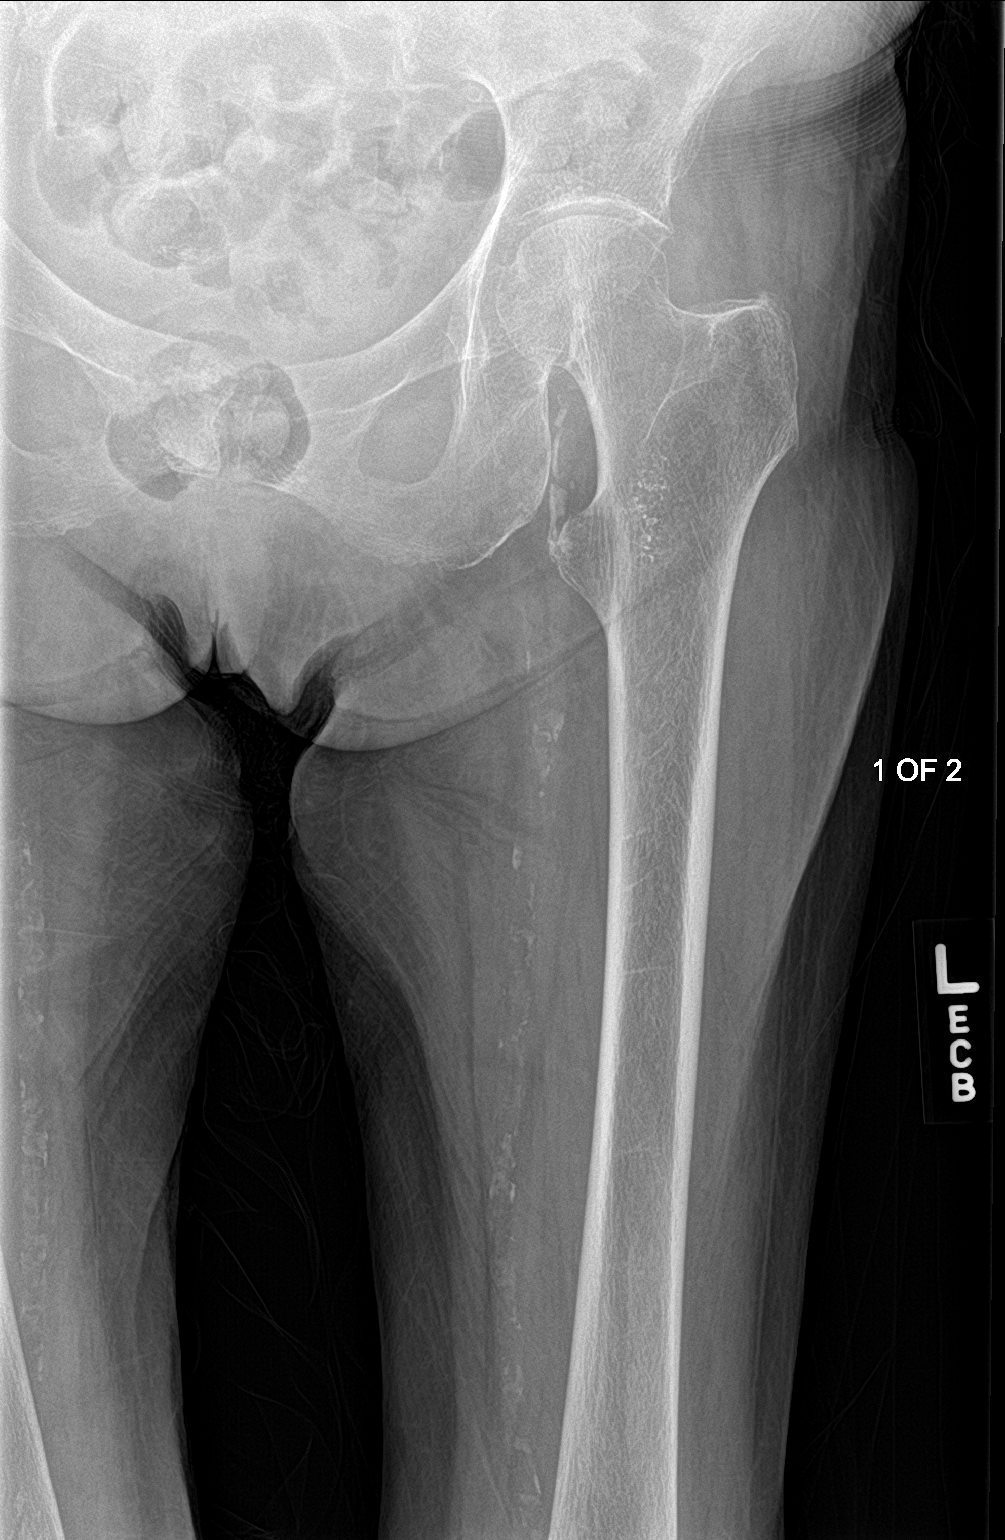

[femur ap (2 of 2)]
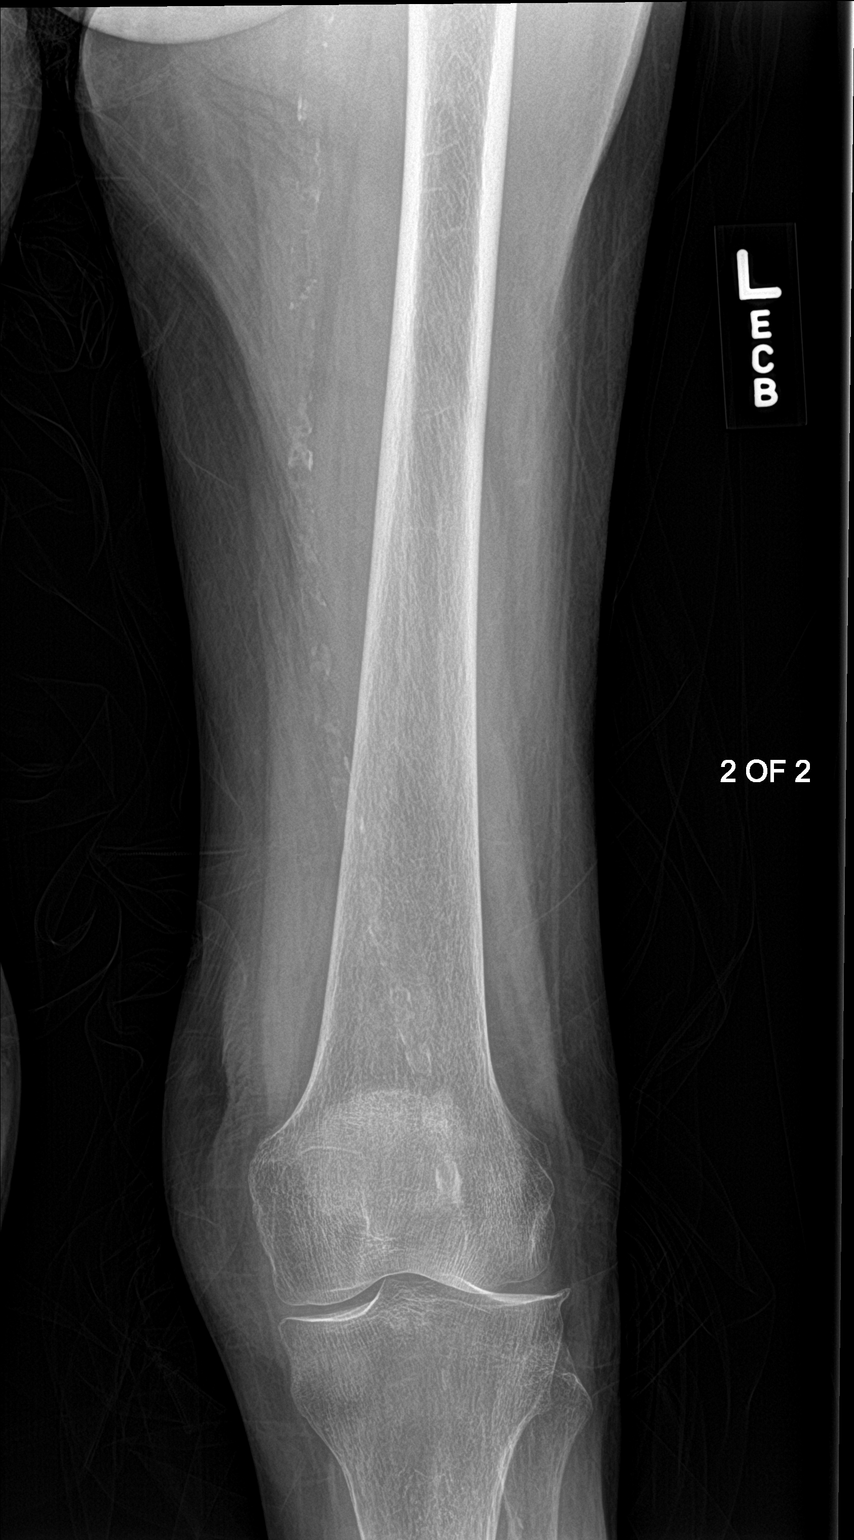

[femur lat (1 of 2)]
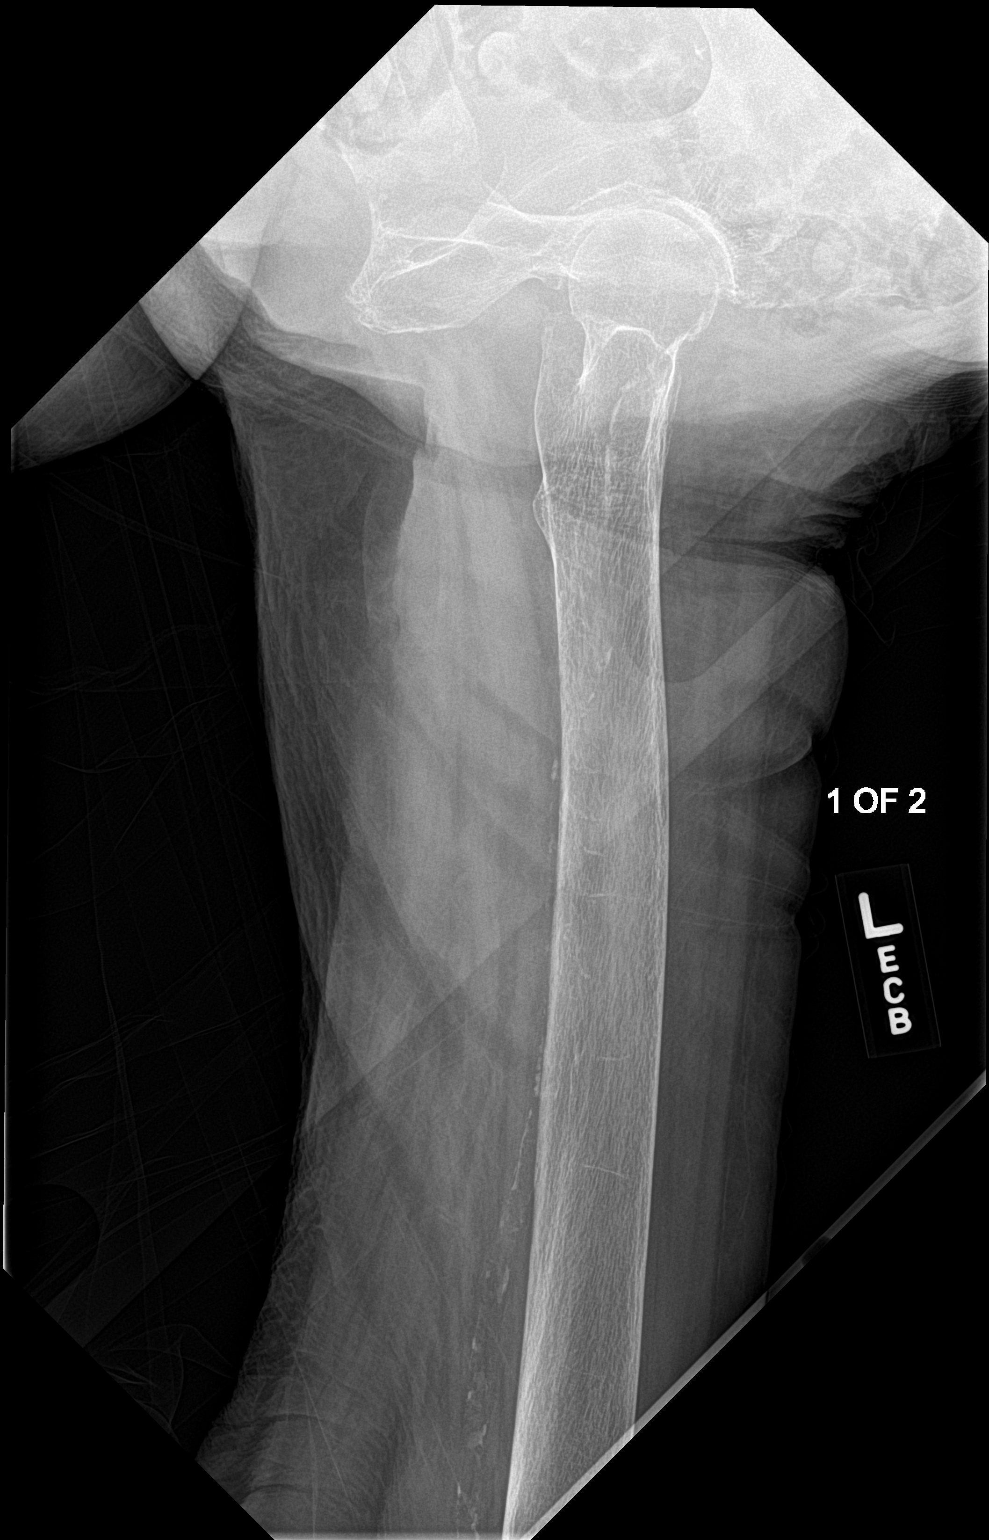

[femur lat (2 of 2)]
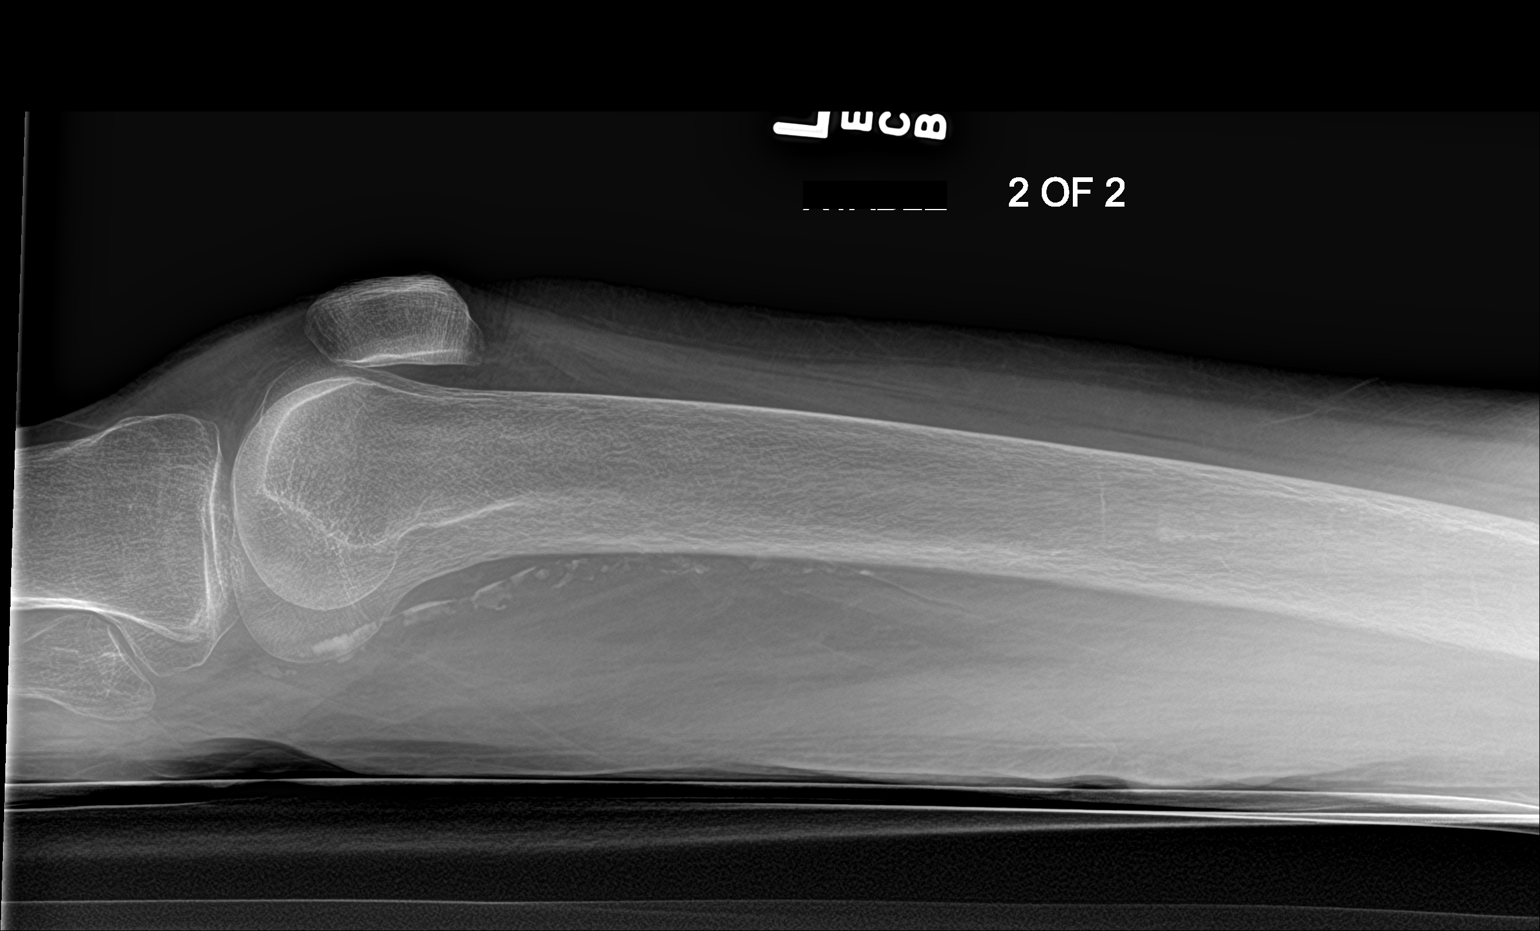

[4 of 4 positions shown; findings below may reference images not displayed]

FINDINGS: Mild degenerative changes in the hip and knee. No evidence of acute
fracture or dislocation of the left femur. No focal bone lesion or
bone destruction. Bone cortex appears intact. Vascular
calcifications.
IMPRESSION: No acute bony abnormalities.

## 2019-05-07 IMAGING — CT CT L SPINE W/O CM
2 of 3 series · 13 of 20 positions shown, 16 images · IV contrast (agent unspecified)
Comparison: CT Abdomen and Pelvis today reported separately. Lumbar
spine CT 01/30/2018.

CLINICAL DATA: [AGE] female status post fall at home with low
back and left side pain.

EXAM:
CT LUMBAR SPINE WITHOUT CONTRAST
TECHNIQUE: 
TECHNIQUE: Multiplanar CT images of the lumbar spine were
reconstructed from contemporary CT of the Abdomen and Pelvis.
CONTRAST:  None.

[Series 3: a/p w/o 5mm · axial · non-contrast · 0.64mm/px · z∈[-332,+8]mm · 10 of 84 slices shown, 13 images]
[im 8/84  soft-tissue]
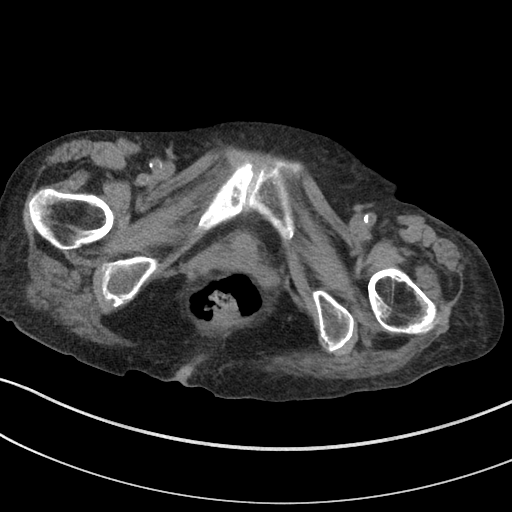
[im 8/84  bone]
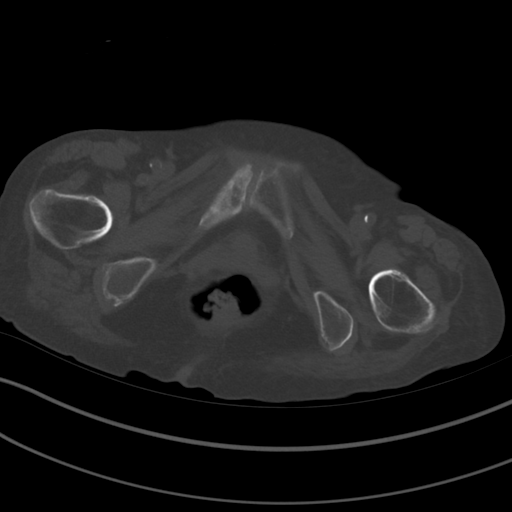
[im 16/84  bone]
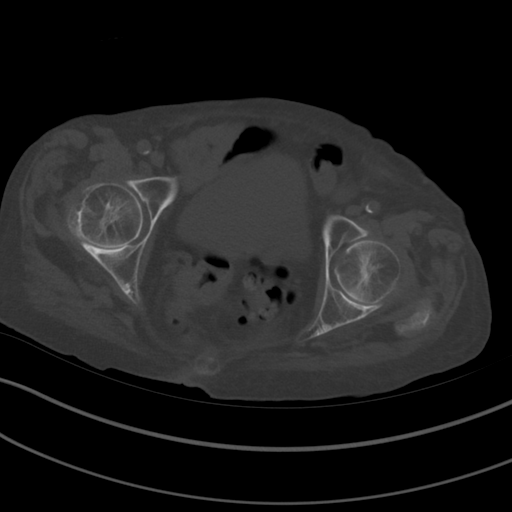
[im 23/84  bone]
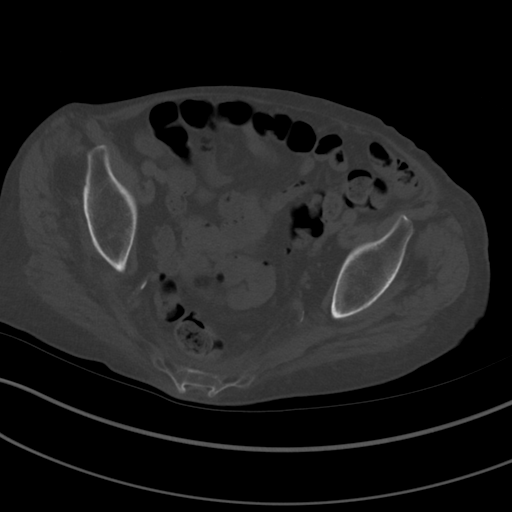
[im 31/84  bone]
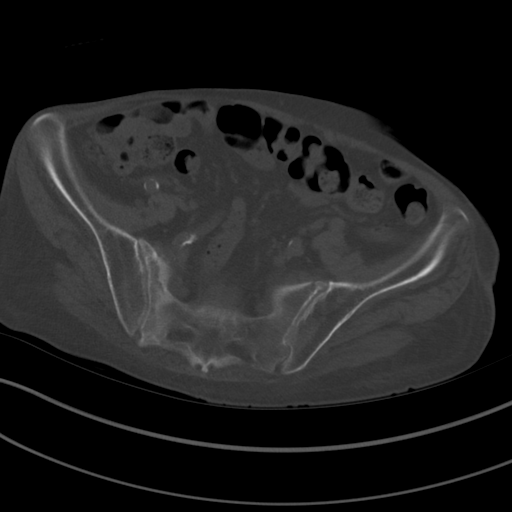
[im 38/84  soft-tissue]
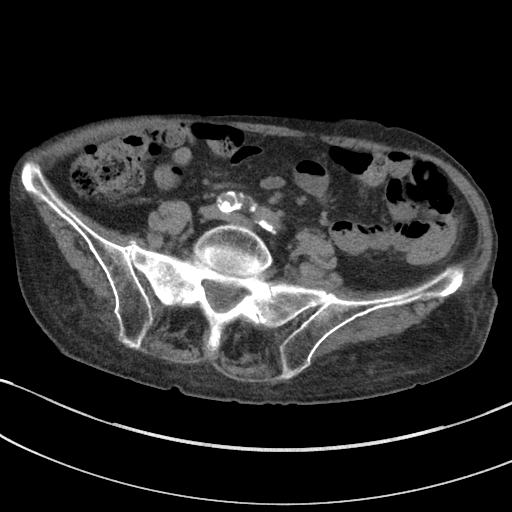
[im 38/84  bone]
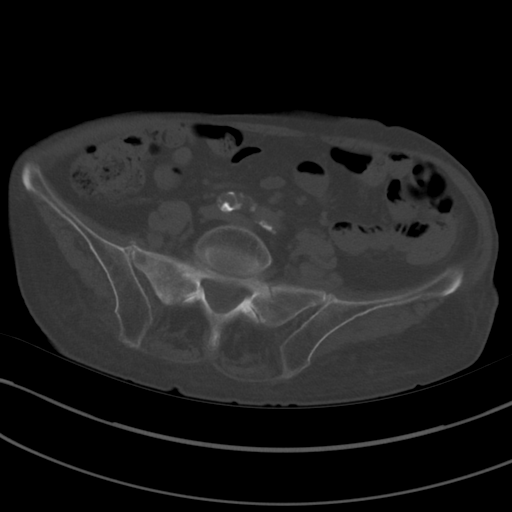
[im 46/84  bone]
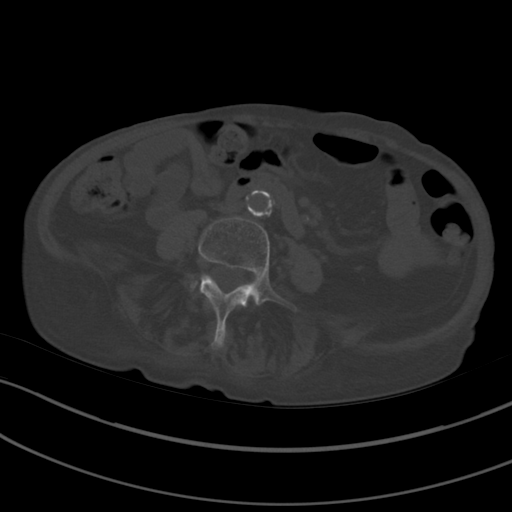
[im 53/84  bone]
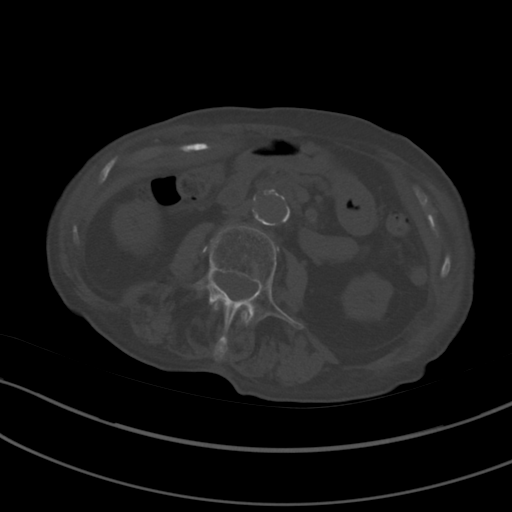
[im 61/84  bone]
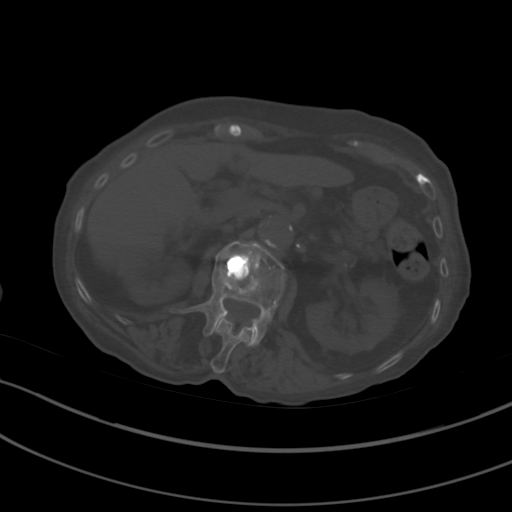
[im 68/84  soft-tissue]
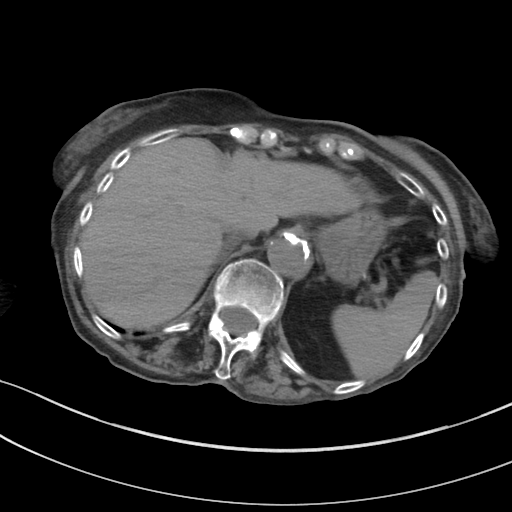
[im 68/84  bone]
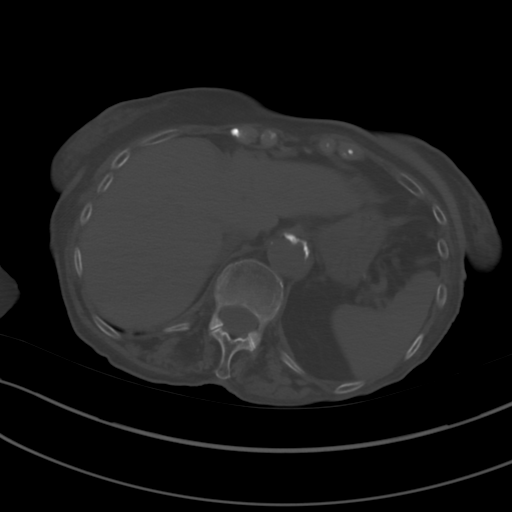
[im 76/84  bone]
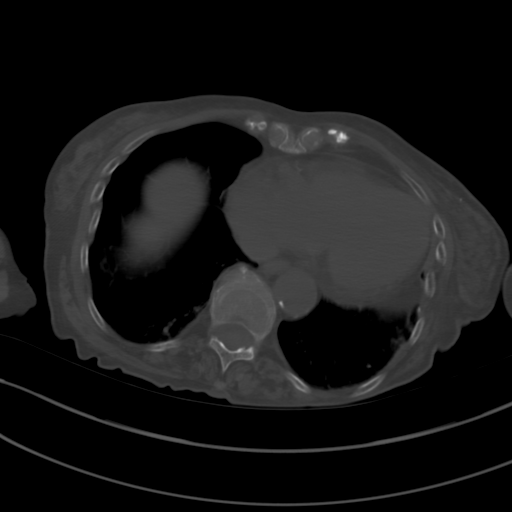

[Series 6: a/p w/o cor · coronal · non-contrast · 0.73mm/px · 3 of 120 slices shown]
[im 24/120  bone]
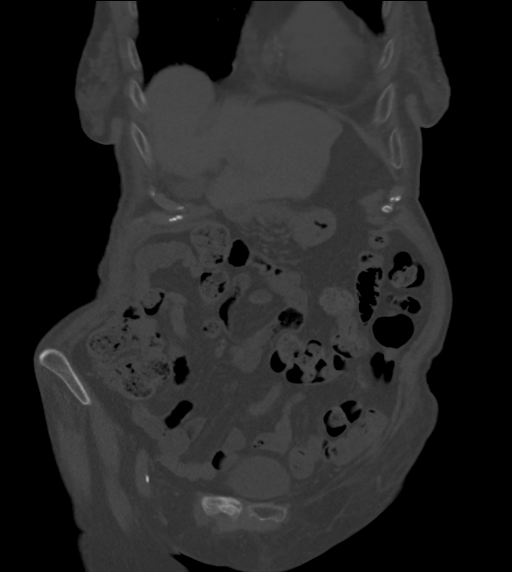
[im 48/120  bone]
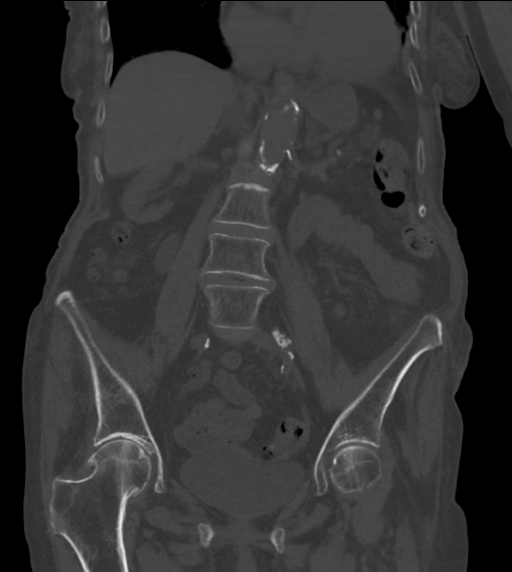
[im 72/120  bone]
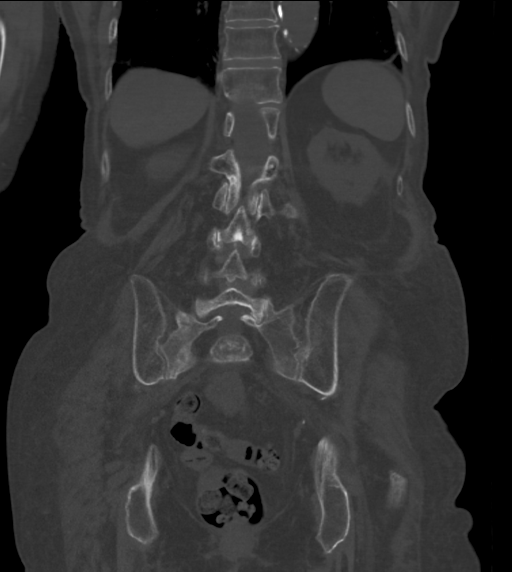

[13 of 20 positions shown; findings below may reference images not displayed]

FINDINGS: Segmentation: Normal, the same numbering system on the 4776 CT.

Alignment: Stable lumbar and lower thoracic alignment since 4776.

New ventral impaction and anterior angulation of the central sacrum
at S1-S2 (sagittal image 47). See additional details below.

Vertebrae: Acute nondisplaced left sacral ala fracture best
demonstrated on coronal image 34. Acute on chronic right sacral ala
fracture (same image) is also nondisplaced. Mild impaction of the
ventral S1-S2 cortex. The entire sacrum is not included on these
images.

Chronic and previously augmented L2 compression fracture is stable
since 4776. No acute osseous abnormality identified in the lower
thoracic spine. Likewise, the L1 through L4 levels appears stable
and intact. There is a nondisplaced left L5 transverse process
fracture which appears acute and is best seen on coronal image 38.
The L5 vertebra otherwise appears intact.

Paraspinal and other soft tissues: Reported with the CT Abdomen and
Pelvis today separately.

Disc levels: Stable since 4776, mild for age lumbar spine
degeneration.
IMPRESSION: 1. Acute nondisplaced sacral fractures affecting the bilateral ala
(right side is acute on chronic) and the central S1-S2 bodies
ventrally.
2. Acute nondisplaced left L5 transverse process fracture (stable
injury).
3. No other acute osseous abnormality in the lumbar spine. Chronic
and previously augmented L2 compression fracture.
4.  CT Abdomen and Pelvis today are reported separately.

## 2019-05-08 DIAGNOSIS — F329 Major depressive disorder, single episode, unspecified: Secondary | ICD-10-CM | POA: Diagnosis not present

## 2019-05-08 DIAGNOSIS — I10 Essential (primary) hypertension: Secondary | ICD-10-CM | POA: Diagnosis not present

## 2019-05-08 DIAGNOSIS — F330 Major depressive disorder, recurrent, mild: Secondary | ICD-10-CM | POA: Diagnosis not present

## 2019-05-08 DIAGNOSIS — K219 Gastro-esophageal reflux disease without esophagitis: Secondary | ICD-10-CM | POA: Diagnosis not present

## 2019-05-21 DIAGNOSIS — F064 Anxiety disorder due to known physiological condition: Secondary | ICD-10-CM | POA: Diagnosis not present

## 2019-05-21 DIAGNOSIS — F331 Major depressive disorder, recurrent, moderate: Secondary | ICD-10-CM | POA: Diagnosis not present

## 2019-05-30 DIAGNOSIS — S32009A Unspecified fracture of unspecified lumbar vertebra, initial encounter for closed fracture: Secondary | ICD-10-CM | POA: Diagnosis not present

## 2019-05-30 DIAGNOSIS — D6489 Other specified anemias: Secondary | ICD-10-CM | POA: Diagnosis not present

## 2019-05-30 DIAGNOSIS — F320 Major depressive disorder, single episode, mild: Secondary | ICD-10-CM | POA: Diagnosis not present

## 2019-05-30 DIAGNOSIS — K219 Gastro-esophageal reflux disease without esophagitis: Secondary | ICD-10-CM | POA: Diagnosis not present

## 2019-06-04 DIAGNOSIS — F331 Major depressive disorder, recurrent, moderate: Secondary | ICD-10-CM | POA: Diagnosis not present

## 2019-06-04 DIAGNOSIS — Z299 Encounter for prophylactic measures, unspecified: Secondary | ICD-10-CM | POA: Diagnosis not present

## 2019-06-04 DIAGNOSIS — F064 Anxiety disorder due to known physiological condition: Secondary | ICD-10-CM | POA: Diagnosis not present

## 2019-06-20 DIAGNOSIS — F331 Major depressive disorder, recurrent, moderate: Secondary | ICD-10-CM | POA: Diagnosis not present

## 2019-06-20 DIAGNOSIS — F064 Anxiety disorder due to known physiological condition: Secondary | ICD-10-CM | POA: Diagnosis not present

## 2019-07-02 DIAGNOSIS — F331 Major depressive disorder, recurrent, moderate: Secondary | ICD-10-CM | POA: Diagnosis not present

## 2019-07-02 DIAGNOSIS — F064 Anxiety disorder due to known physiological condition: Secondary | ICD-10-CM | POA: Diagnosis not present

## 2019-07-15 DIAGNOSIS — I10 Essential (primary) hypertension: Secondary | ICD-10-CM | POA: Diagnosis not present

## 2019-07-15 DIAGNOSIS — R600 Localized edema: Secondary | ICD-10-CM | POA: Diagnosis not present

## 2019-07-17 DIAGNOSIS — F331 Major depressive disorder, recurrent, moderate: Secondary | ICD-10-CM | POA: Diagnosis not present

## 2019-07-17 DIAGNOSIS — F064 Anxiety disorder due to known physiological condition: Secondary | ICD-10-CM | POA: Diagnosis not present

## 2019-08-04 DIAGNOSIS — S72045A Nondisplaced fracture of base of neck of left femur, initial encounter for closed fracture: Secondary | ICD-10-CM | POA: Diagnosis not present

## 2019-08-04 DIAGNOSIS — M1611 Unilateral primary osteoarthritis, right hip: Secondary | ICD-10-CM | POA: Diagnosis not present

## 2019-08-04 DIAGNOSIS — E860 Dehydration: Secondary | ICD-10-CM | POA: Diagnosis not present

## 2019-08-04 DIAGNOSIS — I440 Atrioventricular block, first degree: Secondary | ICD-10-CM | POA: Diagnosis not present

## 2019-08-04 DIAGNOSIS — R0902 Hypoxemia: Secondary | ICD-10-CM | POA: Diagnosis not present

## 2019-08-04 DIAGNOSIS — S72052A Unspecified fracture of head of left femur, initial encounter for closed fracture: Secondary | ICD-10-CM | POA: Diagnosis not present

## 2019-08-04 DIAGNOSIS — M25552 Pain in left hip: Secondary | ICD-10-CM | POA: Diagnosis not present

## 2019-08-04 DIAGNOSIS — S32018A Other fracture of first lumbar vertebra, initial encounter for closed fracture: Secondary | ICD-10-CM | POA: Diagnosis not present

## 2019-08-04 DIAGNOSIS — E785 Hyperlipidemia, unspecified: Secondary | ICD-10-CM | POA: Diagnosis not present

## 2019-08-04 DIAGNOSIS — S72002A Fracture of unspecified part of neck of left femur, initial encounter for closed fracture: Secondary | ICD-10-CM | POA: Diagnosis not present

## 2019-08-04 DIAGNOSIS — R296 Repeated falls: Secondary | ICD-10-CM | POA: Diagnosis not present

## 2019-08-04 DIAGNOSIS — M5416 Radiculopathy, lumbar region: Secondary | ICD-10-CM | POA: Diagnosis not present

## 2019-08-04 DIAGNOSIS — S72002D Fracture of unspecified part of neck of left femur, subsequent encounter for closed fracture with routine healing: Secondary | ICD-10-CM | POA: Diagnosis not present

## 2019-08-04 DIAGNOSIS — W1839XA Other fall on same level, initial encounter: Secondary | ICD-10-CM | POA: Diagnosis not present

## 2019-08-04 DIAGNOSIS — M2550 Pain in unspecified joint: Secondary | ICD-10-CM | POA: Diagnosis not present

## 2019-08-04 DIAGNOSIS — S79912A Unspecified injury of left hip, initial encounter: Secondary | ICD-10-CM | POA: Diagnosis not present

## 2019-08-04 DIAGNOSIS — R7989 Other specified abnormal findings of blood chemistry: Secondary | ICD-10-CM | POA: Diagnosis not present

## 2019-08-04 DIAGNOSIS — S72092A Other fracture of head and neck of left femur, initial encounter for closed fracture: Secondary | ICD-10-CM | POA: Diagnosis not present

## 2019-08-04 DIAGNOSIS — Z66 Do not resuscitate: Secondary | ICD-10-CM | POA: Diagnosis not present

## 2019-08-04 DIAGNOSIS — F419 Anxiety disorder, unspecified: Secondary | ICD-10-CM | POA: Diagnosis not present

## 2019-08-04 DIAGNOSIS — Z7401 Bed confinement status: Secondary | ICD-10-CM | POA: Diagnosis not present

## 2019-08-04 DIAGNOSIS — R52 Pain, unspecified: Secondary | ICD-10-CM | POA: Diagnosis not present

## 2019-08-04 DIAGNOSIS — I10 Essential (primary) hypertension: Secondary | ICD-10-CM | POA: Diagnosis not present

## 2019-08-04 DIAGNOSIS — I2510 Atherosclerotic heart disease of native coronary artery without angina pectoris: Secondary | ICD-10-CM | POA: Diagnosis not present

## 2019-08-04 DIAGNOSIS — J9601 Acute respiratory failure with hypoxia: Secondary | ICD-10-CM | POA: Diagnosis not present

## 2019-08-04 DIAGNOSIS — N189 Chronic kidney disease, unspecified: Secondary | ICD-10-CM | POA: Diagnosis not present

## 2019-08-04 DIAGNOSIS — R262 Difficulty in walking, not elsewhere classified: Secondary | ICD-10-CM | POA: Diagnosis not present

## 2019-08-04 DIAGNOSIS — Z01810 Encounter for preprocedural cardiovascular examination: Secondary | ICD-10-CM | POA: Diagnosis not present

## 2019-08-04 DIAGNOSIS — M80052A Age-related osteoporosis with current pathological fracture, left femur, initial encounter for fracture: Secondary | ICD-10-CM | POA: Diagnosis not present

## 2019-08-04 DIAGNOSIS — J90 Pleural effusion, not elsewhere classified: Secondary | ICD-10-CM | POA: Diagnosis not present

## 2019-08-04 DIAGNOSIS — F0390 Unspecified dementia without behavioral disturbance: Secondary | ICD-10-CM | POA: Diagnosis not present

## 2019-08-04 DIAGNOSIS — S32029S Unspecified fracture of second lumbar vertebra, sequela: Secondary | ICD-10-CM | POA: Diagnosis not present

## 2019-08-04 DIAGNOSIS — R000 Tachycardia, unspecified: Secondary | ICD-10-CM | POA: Diagnosis not present

## 2019-08-04 DIAGNOSIS — S3210XS Unspecified fracture of sacrum, sequela: Secondary | ICD-10-CM | POA: Diagnosis not present

## 2019-08-04 DIAGNOSIS — Z4789 Encounter for other orthopedic aftercare: Secondary | ICD-10-CM | POA: Diagnosis not present

## 2019-08-04 DIAGNOSIS — Z8673 Personal history of transient ischemic attack (TIA), and cerebral infarction without residual deficits: Secondary | ICD-10-CM | POA: Diagnosis not present

## 2019-08-04 DIAGNOSIS — N183 Chronic kidney disease, stage 3 unspecified: Secondary | ICD-10-CM | POA: Diagnosis not present

## 2019-08-04 DIAGNOSIS — I129 Hypertensive chronic kidney disease with stage 1 through stage 4 chronic kidney disease, or unspecified chronic kidney disease: Secondary | ICD-10-CM | POA: Diagnosis not present

## 2019-08-04 DIAGNOSIS — Z20828 Contact with and (suspected) exposure to other viral communicable diseases: Secondary | ICD-10-CM | POA: Diagnosis not present

## 2019-08-04 DIAGNOSIS — R5381 Other malaise: Secondary | ICD-10-CM | POA: Diagnosis not present

## 2019-08-12 DIAGNOSIS — S72002A Fracture of unspecified part of neck of left femur, initial encounter for closed fracture: Secondary | ICD-10-CM | POA: Diagnosis not present

## 2019-08-12 DIAGNOSIS — I10 Essential (primary) hypertension: Secondary | ICD-10-CM | POA: Diagnosis not present

## 2019-08-12 DIAGNOSIS — E785 Hyperlipidemia, unspecified: Secondary | ICD-10-CM | POA: Diagnosis not present

## 2019-08-12 DIAGNOSIS — I129 Hypertensive chronic kidney disease with stage 1 through stage 4 chronic kidney disease, or unspecified chronic kidney disease: Secondary | ICD-10-CM | POA: Diagnosis not present

## 2019-08-12 DIAGNOSIS — Z4789 Encounter for other orthopedic aftercare: Secondary | ICD-10-CM | POA: Diagnosis not present

## 2019-08-12 DIAGNOSIS — M5416 Radiculopathy, lumbar region: Secondary | ICD-10-CM | POA: Diagnosis not present

## 2019-08-12 DIAGNOSIS — S7292XA Unspecified fracture of left femur, initial encounter for closed fracture: Secondary | ICD-10-CM | POA: Diagnosis not present

## 2019-08-12 DIAGNOSIS — M2550 Pain in unspecified joint: Secondary | ICD-10-CM | POA: Diagnosis not present

## 2019-08-12 DIAGNOSIS — R5381 Other malaise: Secondary | ICD-10-CM | POA: Diagnosis not present

## 2019-08-12 DIAGNOSIS — S32029S Unspecified fracture of second lumbar vertebra, sequela: Secondary | ICD-10-CM | POA: Diagnosis not present

## 2019-08-12 DIAGNOSIS — I6389 Other cerebral infarction: Secondary | ICD-10-CM | POA: Diagnosis not present

## 2019-08-12 DIAGNOSIS — F0390 Unspecified dementia without behavioral disturbance: Secondary | ICD-10-CM | POA: Diagnosis not present

## 2019-08-12 DIAGNOSIS — K219 Gastro-esophageal reflux disease without esophagitis: Secondary | ICD-10-CM | POA: Diagnosis not present

## 2019-08-12 DIAGNOSIS — N183 Chronic kidney disease, stage 3 unspecified: Secondary | ICD-10-CM | POA: Diagnosis not present

## 2019-08-12 DIAGNOSIS — M1611 Unilateral primary osteoarthritis, right hip: Secondary | ICD-10-CM | POA: Diagnosis not present

## 2019-08-12 DIAGNOSIS — K5909 Other constipation: Secondary | ICD-10-CM | POA: Diagnosis not present

## 2019-08-12 DIAGNOSIS — Z7401 Bed confinement status: Secondary | ICD-10-CM | POA: Diagnosis not present

## 2019-08-12 DIAGNOSIS — I2510 Atherosclerotic heart disease of native coronary artery without angina pectoris: Secondary | ICD-10-CM | POA: Diagnosis not present

## 2019-08-12 DIAGNOSIS — S3210XS Unspecified fracture of sacrum, sequela: Secondary | ICD-10-CM | POA: Diagnosis not present

## 2019-08-12 DIAGNOSIS — S72002D Fracture of unspecified part of neck of left femur, subsequent encounter for closed fracture with routine healing: Secondary | ICD-10-CM | POA: Diagnosis not present

## 2019-08-14 DIAGNOSIS — I6389 Other cerebral infarction: Secondary | ICD-10-CM | POA: Diagnosis not present

## 2019-08-14 DIAGNOSIS — I10 Essential (primary) hypertension: Secondary | ICD-10-CM | POA: Diagnosis not present

## 2019-08-14 DIAGNOSIS — K219 Gastro-esophageal reflux disease without esophagitis: Secondary | ICD-10-CM | POA: Diagnosis not present

## 2019-08-14 DIAGNOSIS — I129 Hypertensive chronic kidney disease with stage 1 through stage 4 chronic kidney disease, or unspecified chronic kidney disease: Secondary | ICD-10-CM | POA: Diagnosis not present

## 2019-08-19 DIAGNOSIS — S72002D Fracture of unspecified part of neck of left femur, subsequent encounter for closed fracture with routine healing: Secondary | ICD-10-CM | POA: Diagnosis not present

## 2019-08-22 DIAGNOSIS — K5909 Other constipation: Secondary | ICD-10-CM | POA: Diagnosis not present

## 2019-08-22 DIAGNOSIS — K219 Gastro-esophageal reflux disease without esophagitis: Secondary | ICD-10-CM | POA: Diagnosis not present

## 2019-08-22 DIAGNOSIS — S7292XA Unspecified fracture of left femur, initial encounter for closed fracture: Secondary | ICD-10-CM | POA: Diagnosis not present

## 2019-09-09 DIAGNOSIS — F064 Anxiety disorder due to known physiological condition: Secondary | ICD-10-CM | POA: Diagnosis not present

## 2019-09-09 DIAGNOSIS — F320 Major depressive disorder, single episode, mild: Secondary | ICD-10-CM | POA: Diagnosis not present

## 2019-09-09 DIAGNOSIS — N183 Chronic kidney disease, stage 3 unspecified: Secondary | ICD-10-CM | POA: Diagnosis not present

## 2019-09-09 DIAGNOSIS — F331 Major depressive disorder, recurrent, moderate: Secondary | ICD-10-CM | POA: Diagnosis not present

## 2019-09-09 DIAGNOSIS — I10 Essential (primary) hypertension: Secondary | ICD-10-CM | POA: Diagnosis not present

## 2019-09-09 DIAGNOSIS — S7292XA Unspecified fracture of left femur, initial encounter for closed fracture: Secondary | ICD-10-CM | POA: Diagnosis not present

## 2019-09-22 DIAGNOSIS — R296 Repeated falls: Secondary | ICD-10-CM | POA: Diagnosis not present

## 2019-09-22 DIAGNOSIS — R2689 Other abnormalities of gait and mobility: Secondary | ICD-10-CM | POA: Diagnosis not present

## 2019-09-22 DIAGNOSIS — M6281 Muscle weakness (generalized): Secondary | ICD-10-CM | POA: Diagnosis not present

## 2019-09-22 DIAGNOSIS — Z4789 Encounter for other orthopedic aftercare: Secondary | ICD-10-CM | POA: Diagnosis not present

## 2019-09-23 DIAGNOSIS — M6281 Muscle weakness (generalized): Secondary | ICD-10-CM | POA: Diagnosis not present

## 2019-09-23 DIAGNOSIS — Z4789 Encounter for other orthopedic aftercare: Secondary | ICD-10-CM | POA: Diagnosis not present

## 2019-09-23 DIAGNOSIS — R2689 Other abnormalities of gait and mobility: Secondary | ICD-10-CM | POA: Diagnosis not present

## 2019-09-23 DIAGNOSIS — R296 Repeated falls: Secondary | ICD-10-CM | POA: Diagnosis not present

## 2019-09-24 DIAGNOSIS — Z4789 Encounter for other orthopedic aftercare: Secondary | ICD-10-CM | POA: Diagnosis not present

## 2019-09-24 DIAGNOSIS — M6281 Muscle weakness (generalized): Secondary | ICD-10-CM | POA: Diagnosis not present

## 2019-09-24 DIAGNOSIS — R2689 Other abnormalities of gait and mobility: Secondary | ICD-10-CM | POA: Diagnosis not present

## 2019-09-24 DIAGNOSIS — R296 Repeated falls: Secondary | ICD-10-CM | POA: Diagnosis not present

## 2019-09-25 DIAGNOSIS — Z4789 Encounter for other orthopedic aftercare: Secondary | ICD-10-CM | POA: Diagnosis not present

## 2019-09-25 DIAGNOSIS — M6281 Muscle weakness (generalized): Secondary | ICD-10-CM | POA: Diagnosis not present

## 2019-09-25 DIAGNOSIS — R2689 Other abnormalities of gait and mobility: Secondary | ICD-10-CM | POA: Diagnosis not present

## 2019-09-25 DIAGNOSIS — R296 Repeated falls: Secondary | ICD-10-CM | POA: Diagnosis not present

## 2019-09-26 DIAGNOSIS — R2689 Other abnormalities of gait and mobility: Secondary | ICD-10-CM | POA: Diagnosis not present

## 2019-09-26 DIAGNOSIS — R296 Repeated falls: Secondary | ICD-10-CM | POA: Diagnosis not present

## 2019-09-26 DIAGNOSIS — M6281 Muscle weakness (generalized): Secondary | ICD-10-CM | POA: Diagnosis not present

## 2019-09-26 DIAGNOSIS — Z4789 Encounter for other orthopedic aftercare: Secondary | ICD-10-CM | POA: Diagnosis not present

## 2019-09-30 DIAGNOSIS — M6281 Muscle weakness (generalized): Secondary | ICD-10-CM | POA: Diagnosis not present

## 2019-09-30 DIAGNOSIS — R2689 Other abnormalities of gait and mobility: Secondary | ICD-10-CM | POA: Diagnosis not present

## 2019-09-30 DIAGNOSIS — Z4789 Encounter for other orthopedic aftercare: Secondary | ICD-10-CM | POA: Diagnosis not present

## 2019-09-30 DIAGNOSIS — R296 Repeated falls: Secondary | ICD-10-CM | POA: Diagnosis not present

## 2019-10-01 DIAGNOSIS — R2689 Other abnormalities of gait and mobility: Secondary | ICD-10-CM | POA: Diagnosis not present

## 2019-10-01 DIAGNOSIS — R296 Repeated falls: Secondary | ICD-10-CM | POA: Diagnosis not present

## 2019-10-01 DIAGNOSIS — Z4789 Encounter for other orthopedic aftercare: Secondary | ICD-10-CM | POA: Diagnosis not present

## 2019-10-01 DIAGNOSIS — M6281 Muscle weakness (generalized): Secondary | ICD-10-CM | POA: Diagnosis not present

## 2019-10-02 DIAGNOSIS — R2689 Other abnormalities of gait and mobility: Secondary | ICD-10-CM | POA: Diagnosis not present

## 2019-10-02 DIAGNOSIS — R296 Repeated falls: Secondary | ICD-10-CM | POA: Diagnosis not present

## 2019-10-02 DIAGNOSIS — Z4789 Encounter for other orthopedic aftercare: Secondary | ICD-10-CM | POA: Diagnosis not present

## 2019-10-02 DIAGNOSIS — M6281 Muscle weakness (generalized): Secondary | ICD-10-CM | POA: Diagnosis not present

## 2019-10-03 DIAGNOSIS — Z4789 Encounter for other orthopedic aftercare: Secondary | ICD-10-CM | POA: Diagnosis not present

## 2019-10-03 DIAGNOSIS — M6281 Muscle weakness (generalized): Secondary | ICD-10-CM | POA: Diagnosis not present

## 2019-10-03 DIAGNOSIS — R2689 Other abnormalities of gait and mobility: Secondary | ICD-10-CM | POA: Diagnosis not present

## 2019-10-03 DIAGNOSIS — R296 Repeated falls: Secondary | ICD-10-CM | POA: Diagnosis not present

## 2019-10-04 DIAGNOSIS — R2689 Other abnormalities of gait and mobility: Secondary | ICD-10-CM | POA: Diagnosis not present

## 2019-10-04 DIAGNOSIS — Z4789 Encounter for other orthopedic aftercare: Secondary | ICD-10-CM | POA: Diagnosis not present

## 2019-10-04 DIAGNOSIS — R296 Repeated falls: Secondary | ICD-10-CM | POA: Diagnosis not present

## 2019-10-04 DIAGNOSIS — M6281 Muscle weakness (generalized): Secondary | ICD-10-CM | POA: Diagnosis not present

## 2019-10-06 DIAGNOSIS — M6281 Muscle weakness (generalized): Secondary | ICD-10-CM | POA: Diagnosis not present

## 2019-10-06 DIAGNOSIS — Z4789 Encounter for other orthopedic aftercare: Secondary | ICD-10-CM | POA: Diagnosis not present

## 2019-10-06 DIAGNOSIS — R296 Repeated falls: Secondary | ICD-10-CM | POA: Diagnosis not present

## 2019-10-06 DIAGNOSIS — R2689 Other abnormalities of gait and mobility: Secondary | ICD-10-CM | POA: Diagnosis not present

## 2019-10-07 DIAGNOSIS — R296 Repeated falls: Secondary | ICD-10-CM | POA: Diagnosis not present

## 2019-10-07 DIAGNOSIS — R2689 Other abnormalities of gait and mobility: Secondary | ICD-10-CM | POA: Diagnosis not present

## 2019-10-07 DIAGNOSIS — M6281 Muscle weakness (generalized): Secondary | ICD-10-CM | POA: Diagnosis not present

## 2019-10-07 DIAGNOSIS — Z4789 Encounter for other orthopedic aftercare: Secondary | ICD-10-CM | POA: Diagnosis not present

## 2019-10-08 DIAGNOSIS — F331 Major depressive disorder, recurrent, moderate: Secondary | ICD-10-CM | POA: Diagnosis not present

## 2019-10-08 DIAGNOSIS — Z4789 Encounter for other orthopedic aftercare: Secondary | ICD-10-CM | POA: Diagnosis not present

## 2019-10-08 DIAGNOSIS — R296 Repeated falls: Secondary | ICD-10-CM | POA: Diagnosis not present

## 2019-10-08 DIAGNOSIS — R2689 Other abnormalities of gait and mobility: Secondary | ICD-10-CM | POA: Diagnosis not present

## 2019-10-08 DIAGNOSIS — M6281 Muscle weakness (generalized): Secondary | ICD-10-CM | POA: Diagnosis not present

## 2019-10-08 DIAGNOSIS — F064 Anxiety disorder due to known physiological condition: Secondary | ICD-10-CM | POA: Diagnosis not present

## 2019-10-09 DIAGNOSIS — R296 Repeated falls: Secondary | ICD-10-CM | POA: Diagnosis not present

## 2019-10-09 DIAGNOSIS — Z4789 Encounter for other orthopedic aftercare: Secondary | ICD-10-CM | POA: Diagnosis not present

## 2019-10-09 DIAGNOSIS — R2689 Other abnormalities of gait and mobility: Secondary | ICD-10-CM | POA: Diagnosis not present

## 2019-10-09 DIAGNOSIS — M6281 Muscle weakness (generalized): Secondary | ICD-10-CM | POA: Diagnosis not present

## 2019-10-10 DIAGNOSIS — M6281 Muscle weakness (generalized): Secondary | ICD-10-CM | POA: Diagnosis not present

## 2019-10-10 DIAGNOSIS — R296 Repeated falls: Secondary | ICD-10-CM | POA: Diagnosis not present

## 2019-10-10 DIAGNOSIS — Z4789 Encounter for other orthopedic aftercare: Secondary | ICD-10-CM | POA: Diagnosis not present

## 2019-10-10 DIAGNOSIS — R2689 Other abnormalities of gait and mobility: Secondary | ICD-10-CM | POA: Diagnosis not present

## 2019-10-14 DIAGNOSIS — Z4789 Encounter for other orthopedic aftercare: Secondary | ICD-10-CM | POA: Diagnosis not present

## 2019-10-14 DIAGNOSIS — I6389 Other cerebral infarction: Secondary | ICD-10-CM | POA: Diagnosis not present

## 2019-10-14 DIAGNOSIS — M6281 Muscle weakness (generalized): Secondary | ICD-10-CM | POA: Diagnosis not present

## 2019-10-14 DIAGNOSIS — R296 Repeated falls: Secondary | ICD-10-CM | POA: Diagnosis not present

## 2019-10-14 DIAGNOSIS — I129 Hypertensive chronic kidney disease with stage 1 through stage 4 chronic kidney disease, or unspecified chronic kidney disease: Secondary | ICD-10-CM | POA: Diagnosis not present

## 2019-10-14 DIAGNOSIS — R2689 Other abnormalities of gait and mobility: Secondary | ICD-10-CM | POA: Diagnosis not present

## 2019-10-14 DIAGNOSIS — I700 Atherosclerosis of aorta: Secondary | ICD-10-CM | POA: Diagnosis not present

## 2019-10-14 DIAGNOSIS — M5416 Radiculopathy, lumbar region: Secondary | ICD-10-CM | POA: Diagnosis not present

## 2019-10-15 DIAGNOSIS — R296 Repeated falls: Secondary | ICD-10-CM | POA: Diagnosis not present

## 2019-10-15 DIAGNOSIS — R2689 Other abnormalities of gait and mobility: Secondary | ICD-10-CM | POA: Diagnosis not present

## 2019-10-15 DIAGNOSIS — M6281 Muscle weakness (generalized): Secondary | ICD-10-CM | POA: Diagnosis not present

## 2019-10-15 DIAGNOSIS — Z4789 Encounter for other orthopedic aftercare: Secondary | ICD-10-CM | POA: Diagnosis not present

## 2019-10-16 DIAGNOSIS — R296 Repeated falls: Secondary | ICD-10-CM | POA: Diagnosis not present

## 2019-10-16 DIAGNOSIS — M6281 Muscle weakness (generalized): Secondary | ICD-10-CM | POA: Diagnosis not present

## 2019-10-16 DIAGNOSIS — Z4789 Encounter for other orthopedic aftercare: Secondary | ICD-10-CM | POA: Diagnosis not present

## 2019-10-16 DIAGNOSIS — R2689 Other abnormalities of gait and mobility: Secondary | ICD-10-CM | POA: Diagnosis not present

## 2019-10-17 DIAGNOSIS — M6281 Muscle weakness (generalized): Secondary | ICD-10-CM | POA: Diagnosis not present

## 2019-10-17 DIAGNOSIS — R296 Repeated falls: Secondary | ICD-10-CM | POA: Diagnosis not present

## 2019-10-17 DIAGNOSIS — R2689 Other abnormalities of gait and mobility: Secondary | ICD-10-CM | POA: Diagnosis not present

## 2019-10-17 DIAGNOSIS — Z4789 Encounter for other orthopedic aftercare: Secondary | ICD-10-CM | POA: Diagnosis not present

## 2019-10-18 DIAGNOSIS — U071 COVID-19: Secondary | ICD-10-CM | POA: Diagnosis not present

## 2019-10-18 DIAGNOSIS — M6281 Muscle weakness (generalized): Secondary | ICD-10-CM | POA: Diagnosis not present

## 2019-10-18 DIAGNOSIS — R296 Repeated falls: Secondary | ICD-10-CM | POA: Diagnosis not present

## 2019-10-18 DIAGNOSIS — R2689 Other abnormalities of gait and mobility: Secondary | ICD-10-CM | POA: Diagnosis not present

## 2019-10-18 DIAGNOSIS — Z4789 Encounter for other orthopedic aftercare: Secondary | ICD-10-CM | POA: Diagnosis not present

## 2019-10-21 DIAGNOSIS — Z4789 Encounter for other orthopedic aftercare: Secondary | ICD-10-CM | POA: Diagnosis not present

## 2019-10-21 DIAGNOSIS — R296 Repeated falls: Secondary | ICD-10-CM | POA: Diagnosis not present

## 2019-10-21 DIAGNOSIS — M6281 Muscle weakness (generalized): Secondary | ICD-10-CM | POA: Diagnosis not present

## 2019-10-21 DIAGNOSIS — M1990 Unspecified osteoarthritis, unspecified site: Secondary | ICD-10-CM | POA: Diagnosis not present

## 2019-10-21 DIAGNOSIS — R2689 Other abnormalities of gait and mobility: Secondary | ICD-10-CM | POA: Diagnosis not present

## 2019-10-21 DIAGNOSIS — L03116 Cellulitis of left lower limb: Secondary | ICD-10-CM | POA: Diagnosis not present

## 2019-10-21 DIAGNOSIS — U071 COVID-19: Secondary | ICD-10-CM | POA: Diagnosis not present

## 2019-10-22 DIAGNOSIS — R2689 Other abnormalities of gait and mobility: Secondary | ICD-10-CM | POA: Diagnosis not present

## 2019-10-22 DIAGNOSIS — R296 Repeated falls: Secondary | ICD-10-CM | POA: Diagnosis not present

## 2019-10-22 DIAGNOSIS — M6281 Muscle weakness (generalized): Secondary | ICD-10-CM | POA: Diagnosis not present

## 2019-10-22 DIAGNOSIS — Z4789 Encounter for other orthopedic aftercare: Secondary | ICD-10-CM | POA: Diagnosis not present

## 2019-10-23 DIAGNOSIS — R2689 Other abnormalities of gait and mobility: Secondary | ICD-10-CM | POA: Diagnosis not present

## 2019-10-23 DIAGNOSIS — R296 Repeated falls: Secondary | ICD-10-CM | POA: Diagnosis not present

## 2019-10-23 DIAGNOSIS — L03116 Cellulitis of left lower limb: Secondary | ICD-10-CM | POA: Diagnosis not present

## 2019-10-23 DIAGNOSIS — M6281 Muscle weakness (generalized): Secondary | ICD-10-CM | POA: Diagnosis not present

## 2019-10-23 DIAGNOSIS — Z4789 Encounter for other orthopedic aftercare: Secondary | ICD-10-CM | POA: Diagnosis not present

## 2019-10-23 DIAGNOSIS — U071 COVID-19: Secondary | ICD-10-CM | POA: Diagnosis not present

## 2019-10-24 DIAGNOSIS — R2689 Other abnormalities of gait and mobility: Secondary | ICD-10-CM | POA: Diagnosis not present

## 2019-10-24 DIAGNOSIS — R296 Repeated falls: Secondary | ICD-10-CM | POA: Diagnosis not present

## 2019-10-24 DIAGNOSIS — M6281 Muscle weakness (generalized): Secondary | ICD-10-CM | POA: Diagnosis not present

## 2019-10-24 DIAGNOSIS — Z4789 Encounter for other orthopedic aftercare: Secondary | ICD-10-CM | POA: Diagnosis not present

## 2019-10-25 DIAGNOSIS — Z4789 Encounter for other orthopedic aftercare: Secondary | ICD-10-CM | POA: Diagnosis not present

## 2019-10-25 DIAGNOSIS — M6281 Muscle weakness (generalized): Secondary | ICD-10-CM | POA: Diagnosis not present

## 2019-10-25 DIAGNOSIS — R296 Repeated falls: Secondary | ICD-10-CM | POA: Diagnosis not present

## 2019-10-25 DIAGNOSIS — R2689 Other abnormalities of gait and mobility: Secondary | ICD-10-CM | POA: Diagnosis not present

## 2019-10-28 DIAGNOSIS — Z4789 Encounter for other orthopedic aftercare: Secondary | ICD-10-CM | POA: Diagnosis not present

## 2019-10-28 DIAGNOSIS — R296 Repeated falls: Secondary | ICD-10-CM | POA: Diagnosis not present

## 2019-10-28 DIAGNOSIS — M6281 Muscle weakness (generalized): Secondary | ICD-10-CM | POA: Diagnosis not present

## 2019-10-28 DIAGNOSIS — R2689 Other abnormalities of gait and mobility: Secondary | ICD-10-CM | POA: Diagnosis not present

## 2019-10-29 DIAGNOSIS — Z4789 Encounter for other orthopedic aftercare: Secondary | ICD-10-CM | POA: Diagnosis not present

## 2019-10-29 DIAGNOSIS — R296 Repeated falls: Secondary | ICD-10-CM | POA: Diagnosis not present

## 2019-10-29 DIAGNOSIS — M6281 Muscle weakness (generalized): Secondary | ICD-10-CM | POA: Diagnosis not present

## 2019-10-29 DIAGNOSIS — R2689 Other abnormalities of gait and mobility: Secondary | ICD-10-CM | POA: Diagnosis not present

## 2019-10-30 DIAGNOSIS — M6281 Muscle weakness (generalized): Secondary | ICD-10-CM | POA: Diagnosis not present

## 2019-10-30 DIAGNOSIS — R296 Repeated falls: Secondary | ICD-10-CM | POA: Diagnosis not present

## 2019-10-30 DIAGNOSIS — Z4789 Encounter for other orthopedic aftercare: Secondary | ICD-10-CM | POA: Diagnosis not present

## 2019-10-30 DIAGNOSIS — R2689 Other abnormalities of gait and mobility: Secondary | ICD-10-CM | POA: Diagnosis not present

## 2019-10-31 DIAGNOSIS — M6281 Muscle weakness (generalized): Secondary | ICD-10-CM | POA: Diagnosis not present

## 2019-10-31 DIAGNOSIS — R296 Repeated falls: Secondary | ICD-10-CM | POA: Diagnosis not present

## 2019-10-31 DIAGNOSIS — R2689 Other abnormalities of gait and mobility: Secondary | ICD-10-CM | POA: Diagnosis not present

## 2019-10-31 DIAGNOSIS — Z4789 Encounter for other orthopedic aftercare: Secondary | ICD-10-CM | POA: Diagnosis not present

## 2019-11-01 DIAGNOSIS — R296 Repeated falls: Secondary | ICD-10-CM | POA: Diagnosis not present

## 2019-11-01 DIAGNOSIS — M6281 Muscle weakness (generalized): Secondary | ICD-10-CM | POA: Diagnosis not present

## 2019-11-01 DIAGNOSIS — R2689 Other abnormalities of gait and mobility: Secondary | ICD-10-CM | POA: Diagnosis not present

## 2019-11-01 DIAGNOSIS — Z4789 Encounter for other orthopedic aftercare: Secondary | ICD-10-CM | POA: Diagnosis not present

## 2019-11-05 DIAGNOSIS — F331 Major depressive disorder, recurrent, moderate: Secondary | ICD-10-CM | POA: Diagnosis not present

## 2019-11-05 DIAGNOSIS — F064 Anxiety disorder due to known physiological condition: Secondary | ICD-10-CM | POA: Diagnosis not present

## 2019-11-06 DIAGNOSIS — U071 COVID-19: Secondary | ICD-10-CM | POA: Diagnosis not present

## 2019-11-15 DIAGNOSIS — I10 Essential (primary) hypertension: Secondary | ICD-10-CM | POA: Diagnosis not present

## 2019-11-15 DIAGNOSIS — S72002A Fracture of unspecified part of neck of left femur, initial encounter for closed fracture: Secondary | ICD-10-CM | POA: Diagnosis not present

## 2019-11-15 DIAGNOSIS — F320 Major depressive disorder, single episode, mild: Secondary | ICD-10-CM | POA: Diagnosis not present

## 2019-11-15 DIAGNOSIS — K219 Gastro-esophageal reflux disease without esophagitis: Secondary | ICD-10-CM | POA: Diagnosis not present

## 2019-12-03 DIAGNOSIS — F331 Major depressive disorder, recurrent, moderate: Secondary | ICD-10-CM | POA: Diagnosis not present

## 2019-12-03 DIAGNOSIS — F064 Anxiety disorder due to known physiological condition: Secondary | ICD-10-CM | POA: Diagnosis not present

## 2019-12-17 DIAGNOSIS — F331 Major depressive disorder, recurrent, moderate: Secondary | ICD-10-CM | POA: Diagnosis not present

## 2019-12-17 DIAGNOSIS — F064 Anxiety disorder due to known physiological condition: Secondary | ICD-10-CM | POA: Diagnosis not present

## 2019-12-27 DIAGNOSIS — F320 Major depressive disorder, single episode, mild: Secondary | ICD-10-CM | POA: Diagnosis not present

## 2019-12-27 DIAGNOSIS — I10 Essential (primary) hypertension: Secondary | ICD-10-CM | POA: Diagnosis not present

## 2019-12-27 DIAGNOSIS — I6389 Other cerebral infarction: Secondary | ICD-10-CM | POA: Diagnosis not present

## 2019-12-27 DIAGNOSIS — K219 Gastro-esophageal reflux disease without esophagitis: Secondary | ICD-10-CM | POA: Diagnosis not present

## 2019-12-31 DIAGNOSIS — F064 Anxiety disorder due to known physiological condition: Secondary | ICD-10-CM | POA: Diagnosis not present

## 2019-12-31 DIAGNOSIS — F331 Major depressive disorder, recurrent, moderate: Secondary | ICD-10-CM | POA: Diagnosis not present

## 2020-01-28 DIAGNOSIS — F331 Major depressive disorder, recurrent, moderate: Secondary | ICD-10-CM | POA: Diagnosis not present

## 2020-01-28 DIAGNOSIS — F064 Anxiety disorder due to known physiological condition: Secondary | ICD-10-CM | POA: Diagnosis not present

## 2020-02-18 DIAGNOSIS — D649 Anemia, unspecified: Secondary | ICD-10-CM | POA: Diagnosis not present

## 2020-02-18 DIAGNOSIS — N183 Chronic kidney disease, stage 3 unspecified: Secondary | ICD-10-CM | POA: Diagnosis not present

## 2020-02-25 DIAGNOSIS — F331 Major depressive disorder, recurrent, moderate: Secondary | ICD-10-CM | POA: Diagnosis not present

## 2020-02-25 DIAGNOSIS — F064 Anxiety disorder due to known physiological condition: Secondary | ICD-10-CM | POA: Diagnosis not present

## 2020-03-02 DIAGNOSIS — M6281 Muscle weakness (generalized): Secondary | ICD-10-CM | POA: Diagnosis not present

## 2020-03-02 DIAGNOSIS — F0390 Unspecified dementia without behavioral disturbance: Secondary | ICD-10-CM | POA: Diagnosis not present

## 2020-03-02 DIAGNOSIS — Z741 Need for assistance with personal care: Secondary | ICD-10-CM | POA: Diagnosis not present

## 2020-03-02 DIAGNOSIS — R296 Repeated falls: Secondary | ICD-10-CM | POA: Diagnosis not present

## 2020-03-02 DIAGNOSIS — F0150 Vascular dementia without behavioral disturbance: Secondary | ICD-10-CM | POA: Diagnosis not present

## 2020-03-03 DIAGNOSIS — Z741 Need for assistance with personal care: Secondary | ICD-10-CM | POA: Diagnosis not present

## 2020-03-03 DIAGNOSIS — F0150 Vascular dementia without behavioral disturbance: Secondary | ICD-10-CM | POA: Diagnosis not present

## 2020-03-03 DIAGNOSIS — F0390 Unspecified dementia without behavioral disturbance: Secondary | ICD-10-CM | POA: Diagnosis not present

## 2020-03-03 DIAGNOSIS — M6281 Muscle weakness (generalized): Secondary | ICD-10-CM | POA: Diagnosis not present

## 2020-03-03 DIAGNOSIS — R296 Repeated falls: Secondary | ICD-10-CM | POA: Diagnosis not present

## 2020-03-04 DIAGNOSIS — F0390 Unspecified dementia without behavioral disturbance: Secondary | ICD-10-CM | POA: Diagnosis not present

## 2020-03-04 DIAGNOSIS — M6281 Muscle weakness (generalized): Secondary | ICD-10-CM | POA: Diagnosis not present

## 2020-03-04 DIAGNOSIS — R296 Repeated falls: Secondary | ICD-10-CM | POA: Diagnosis not present

## 2020-03-04 DIAGNOSIS — Z741 Need for assistance with personal care: Secondary | ICD-10-CM | POA: Diagnosis not present

## 2020-03-04 DIAGNOSIS — F0150 Vascular dementia without behavioral disturbance: Secondary | ICD-10-CM | POA: Diagnosis not present

## 2020-03-05 DIAGNOSIS — R296 Repeated falls: Secondary | ICD-10-CM | POA: Diagnosis not present

## 2020-03-05 DIAGNOSIS — F0390 Unspecified dementia without behavioral disturbance: Secondary | ICD-10-CM | POA: Diagnosis not present

## 2020-03-05 DIAGNOSIS — Z741 Need for assistance with personal care: Secondary | ICD-10-CM | POA: Diagnosis not present

## 2020-03-05 DIAGNOSIS — F0150 Vascular dementia without behavioral disturbance: Secondary | ICD-10-CM | POA: Diagnosis not present

## 2020-03-05 DIAGNOSIS — M6281 Muscle weakness (generalized): Secondary | ICD-10-CM | POA: Diagnosis not present

## 2020-03-06 DIAGNOSIS — M6281 Muscle weakness (generalized): Secondary | ICD-10-CM | POA: Diagnosis not present

## 2020-03-06 DIAGNOSIS — F0390 Unspecified dementia without behavioral disturbance: Secondary | ICD-10-CM | POA: Diagnosis not present

## 2020-03-06 DIAGNOSIS — Z741 Need for assistance with personal care: Secondary | ICD-10-CM | POA: Diagnosis not present

## 2020-03-06 DIAGNOSIS — R296 Repeated falls: Secondary | ICD-10-CM | POA: Diagnosis not present

## 2020-03-06 DIAGNOSIS — F0150 Vascular dementia without behavioral disturbance: Secondary | ICD-10-CM | POA: Diagnosis not present

## 2020-03-09 DIAGNOSIS — F0390 Unspecified dementia without behavioral disturbance: Secondary | ICD-10-CM | POA: Diagnosis not present

## 2020-03-09 DIAGNOSIS — R296 Repeated falls: Secondary | ICD-10-CM | POA: Diagnosis not present

## 2020-03-09 DIAGNOSIS — Z741 Need for assistance with personal care: Secondary | ICD-10-CM | POA: Diagnosis not present

## 2020-03-09 DIAGNOSIS — F0150 Vascular dementia without behavioral disturbance: Secondary | ICD-10-CM | POA: Diagnosis not present

## 2020-03-09 DIAGNOSIS — M6281 Muscle weakness (generalized): Secondary | ICD-10-CM | POA: Diagnosis not present

## 2020-03-10 DIAGNOSIS — M6281 Muscle weakness (generalized): Secondary | ICD-10-CM | POA: Diagnosis not present

## 2020-03-10 DIAGNOSIS — F0390 Unspecified dementia without behavioral disturbance: Secondary | ICD-10-CM | POA: Diagnosis not present

## 2020-03-10 DIAGNOSIS — Z741 Need for assistance with personal care: Secondary | ICD-10-CM | POA: Diagnosis not present

## 2020-03-10 DIAGNOSIS — F0150 Vascular dementia without behavioral disturbance: Secondary | ICD-10-CM | POA: Diagnosis not present

## 2020-03-10 DIAGNOSIS — R296 Repeated falls: Secondary | ICD-10-CM | POA: Diagnosis not present

## 2020-03-11 DIAGNOSIS — R296 Repeated falls: Secondary | ICD-10-CM | POA: Diagnosis not present

## 2020-03-11 DIAGNOSIS — F0390 Unspecified dementia without behavioral disturbance: Secondary | ICD-10-CM | POA: Diagnosis not present

## 2020-03-11 DIAGNOSIS — Z741 Need for assistance with personal care: Secondary | ICD-10-CM | POA: Diagnosis not present

## 2020-03-11 DIAGNOSIS — M6281 Muscle weakness (generalized): Secondary | ICD-10-CM | POA: Diagnosis not present

## 2020-03-11 DIAGNOSIS — F0150 Vascular dementia without behavioral disturbance: Secondary | ICD-10-CM | POA: Diagnosis not present

## 2020-03-12 DIAGNOSIS — M6281 Muscle weakness (generalized): Secondary | ICD-10-CM | POA: Diagnosis not present

## 2020-03-12 DIAGNOSIS — R296 Repeated falls: Secondary | ICD-10-CM | POA: Diagnosis not present

## 2020-03-12 DIAGNOSIS — F0150 Vascular dementia without behavioral disturbance: Secondary | ICD-10-CM | POA: Diagnosis not present

## 2020-03-12 DIAGNOSIS — F0390 Unspecified dementia without behavioral disturbance: Secondary | ICD-10-CM | POA: Diagnosis not present

## 2020-03-12 DIAGNOSIS — Z741 Need for assistance with personal care: Secondary | ICD-10-CM | POA: Diagnosis not present

## 2020-03-13 DIAGNOSIS — I10 Essential (primary) hypertension: Secondary | ICD-10-CM | POA: Diagnosis not present

## 2020-03-13 DIAGNOSIS — F418 Other specified anxiety disorders: Secondary | ICD-10-CM | POA: Diagnosis not present

## 2020-03-13 DIAGNOSIS — F320 Major depressive disorder, single episode, mild: Secondary | ICD-10-CM | POA: Diagnosis not present

## 2020-03-13 DIAGNOSIS — M1388 Other specified arthritis, other site: Secondary | ICD-10-CM | POA: Diagnosis not present

## 2020-03-16 DIAGNOSIS — Z741 Need for assistance with personal care: Secondary | ICD-10-CM | POA: Diagnosis not present

## 2020-03-16 DIAGNOSIS — F0390 Unspecified dementia without behavioral disturbance: Secondary | ICD-10-CM | POA: Diagnosis not present

## 2020-03-16 DIAGNOSIS — F0150 Vascular dementia without behavioral disturbance: Secondary | ICD-10-CM | POA: Diagnosis not present

## 2020-03-16 DIAGNOSIS — R296 Repeated falls: Secondary | ICD-10-CM | POA: Diagnosis not present

## 2020-03-16 DIAGNOSIS — M6281 Muscle weakness (generalized): Secondary | ICD-10-CM | POA: Diagnosis not present

## 2020-03-17 DIAGNOSIS — M6281 Muscle weakness (generalized): Secondary | ICD-10-CM | POA: Diagnosis not present

## 2020-03-17 DIAGNOSIS — F0150 Vascular dementia without behavioral disturbance: Secondary | ICD-10-CM | POA: Diagnosis not present

## 2020-03-17 DIAGNOSIS — Z741 Need for assistance with personal care: Secondary | ICD-10-CM | POA: Diagnosis not present

## 2020-03-17 DIAGNOSIS — F0390 Unspecified dementia without behavioral disturbance: Secondary | ICD-10-CM | POA: Diagnosis not present

## 2020-03-17 DIAGNOSIS — R296 Repeated falls: Secondary | ICD-10-CM | POA: Diagnosis not present

## 2020-03-18 DIAGNOSIS — M6281 Muscle weakness (generalized): Secondary | ICD-10-CM | POA: Diagnosis not present

## 2020-03-18 DIAGNOSIS — Z741 Need for assistance with personal care: Secondary | ICD-10-CM | POA: Diagnosis not present

## 2020-03-18 DIAGNOSIS — F0390 Unspecified dementia without behavioral disturbance: Secondary | ICD-10-CM | POA: Diagnosis not present

## 2020-03-18 DIAGNOSIS — R296 Repeated falls: Secondary | ICD-10-CM | POA: Diagnosis not present

## 2020-03-18 DIAGNOSIS — F0150 Vascular dementia without behavioral disturbance: Secondary | ICD-10-CM | POA: Diagnosis not present

## 2020-03-19 DIAGNOSIS — R296 Repeated falls: Secondary | ICD-10-CM | POA: Diagnosis not present

## 2020-03-19 DIAGNOSIS — M6281 Muscle weakness (generalized): Secondary | ICD-10-CM | POA: Diagnosis not present

## 2020-03-19 DIAGNOSIS — F0390 Unspecified dementia without behavioral disturbance: Secondary | ICD-10-CM | POA: Diagnosis not present

## 2020-03-19 DIAGNOSIS — Z741 Need for assistance with personal care: Secondary | ICD-10-CM | POA: Diagnosis not present

## 2020-03-19 DIAGNOSIS — F0150 Vascular dementia without behavioral disturbance: Secondary | ICD-10-CM | POA: Diagnosis not present

## 2020-03-20 DIAGNOSIS — R296 Repeated falls: Secondary | ICD-10-CM | POA: Diagnosis not present

## 2020-03-20 DIAGNOSIS — F0390 Unspecified dementia without behavioral disturbance: Secondary | ICD-10-CM | POA: Diagnosis not present

## 2020-03-20 DIAGNOSIS — F0150 Vascular dementia without behavioral disturbance: Secondary | ICD-10-CM | POA: Diagnosis not present

## 2020-03-20 DIAGNOSIS — Z741 Need for assistance with personal care: Secondary | ICD-10-CM | POA: Diagnosis not present

## 2020-03-20 DIAGNOSIS — M6281 Muscle weakness (generalized): Secondary | ICD-10-CM | POA: Diagnosis not present

## 2020-03-23 DIAGNOSIS — R296 Repeated falls: Secondary | ICD-10-CM | POA: Diagnosis not present

## 2020-03-23 DIAGNOSIS — F0150 Vascular dementia without behavioral disturbance: Secondary | ICD-10-CM | POA: Diagnosis not present

## 2020-03-23 DIAGNOSIS — F0390 Unspecified dementia without behavioral disturbance: Secondary | ICD-10-CM | POA: Diagnosis not present

## 2020-03-23 DIAGNOSIS — M6281 Muscle weakness (generalized): Secondary | ICD-10-CM | POA: Diagnosis not present

## 2020-03-23 DIAGNOSIS — Z741 Need for assistance with personal care: Secondary | ICD-10-CM | POA: Diagnosis not present

## 2020-03-24 DIAGNOSIS — R296 Repeated falls: Secondary | ICD-10-CM | POA: Diagnosis not present

## 2020-03-24 DIAGNOSIS — F0390 Unspecified dementia without behavioral disturbance: Secondary | ICD-10-CM | POA: Diagnosis not present

## 2020-03-24 DIAGNOSIS — F331 Major depressive disorder, recurrent, moderate: Secondary | ICD-10-CM | POA: Diagnosis not present

## 2020-03-24 DIAGNOSIS — M6281 Muscle weakness (generalized): Secondary | ICD-10-CM | POA: Diagnosis not present

## 2020-03-24 DIAGNOSIS — F064 Anxiety disorder due to known physiological condition: Secondary | ICD-10-CM | POA: Diagnosis not present

## 2020-03-24 DIAGNOSIS — F0150 Vascular dementia without behavioral disturbance: Secondary | ICD-10-CM | POA: Diagnosis not present

## 2020-03-24 DIAGNOSIS — Z741 Need for assistance with personal care: Secondary | ICD-10-CM | POA: Diagnosis not present

## 2020-03-25 DIAGNOSIS — F0390 Unspecified dementia without behavioral disturbance: Secondary | ICD-10-CM | POA: Diagnosis not present

## 2020-03-25 DIAGNOSIS — M6281 Muscle weakness (generalized): Secondary | ICD-10-CM | POA: Diagnosis not present

## 2020-03-25 DIAGNOSIS — F0150 Vascular dementia without behavioral disturbance: Secondary | ICD-10-CM | POA: Diagnosis not present

## 2020-03-25 DIAGNOSIS — R296 Repeated falls: Secondary | ICD-10-CM | POA: Diagnosis not present

## 2020-03-25 DIAGNOSIS — Z741 Need for assistance with personal care: Secondary | ICD-10-CM | POA: Diagnosis not present

## 2020-03-26 DIAGNOSIS — F0150 Vascular dementia without behavioral disturbance: Secondary | ICD-10-CM | POA: Diagnosis not present

## 2020-03-26 DIAGNOSIS — F0390 Unspecified dementia without behavioral disturbance: Secondary | ICD-10-CM | POA: Diagnosis not present

## 2020-03-26 DIAGNOSIS — Z741 Need for assistance with personal care: Secondary | ICD-10-CM | POA: Diagnosis not present

## 2020-03-26 DIAGNOSIS — R296 Repeated falls: Secondary | ICD-10-CM | POA: Diagnosis not present

## 2020-03-26 DIAGNOSIS — M6281 Muscle weakness (generalized): Secondary | ICD-10-CM | POA: Diagnosis not present

## 2020-04-28 DIAGNOSIS — F064 Anxiety disorder due to known physiological condition: Secondary | ICD-10-CM | POA: Diagnosis not present

## 2020-04-28 DIAGNOSIS — F331 Major depressive disorder, recurrent, moderate: Secondary | ICD-10-CM | POA: Diagnosis not present

## 2020-05-04 DIAGNOSIS — I6389 Other cerebral infarction: Secondary | ICD-10-CM | POA: Diagnosis not present

## 2020-05-04 DIAGNOSIS — M5416 Radiculopathy, lumbar region: Secondary | ICD-10-CM | POA: Diagnosis not present

## 2020-05-04 DIAGNOSIS — I700 Atherosclerosis of aorta: Secondary | ICD-10-CM | POA: Diagnosis not present

## 2020-05-04 DIAGNOSIS — I129 Hypertensive chronic kidney disease with stage 1 through stage 4 chronic kidney disease, or unspecified chronic kidney disease: Secondary | ICD-10-CM | POA: Diagnosis not present

## 2020-05-26 DIAGNOSIS — F064 Anxiety disorder due to known physiological condition: Secondary | ICD-10-CM | POA: Diagnosis not present

## 2020-05-26 DIAGNOSIS — F331 Major depressive disorder, recurrent, moderate: Secondary | ICD-10-CM | POA: Diagnosis not present

## 2020-06-10 DIAGNOSIS — F0150 Vascular dementia without behavioral disturbance: Secondary | ICD-10-CM | POA: Diagnosis not present

## 2020-06-10 DIAGNOSIS — M6281 Muscle weakness (generalized): Secondary | ICD-10-CM | POA: Diagnosis not present

## 2020-06-10 DIAGNOSIS — Z741 Need for assistance with personal care: Secondary | ICD-10-CM | POA: Diagnosis not present

## 2020-06-10 DIAGNOSIS — R296 Repeated falls: Secondary | ICD-10-CM | POA: Diagnosis not present

## 2020-06-10 DIAGNOSIS — R52 Pain, unspecified: Secondary | ICD-10-CM | POA: Diagnosis not present

## 2020-06-11 DIAGNOSIS — R52 Pain, unspecified: Secondary | ICD-10-CM | POA: Diagnosis not present

## 2020-06-11 DIAGNOSIS — F0150 Vascular dementia without behavioral disturbance: Secondary | ICD-10-CM | POA: Diagnosis not present

## 2020-06-11 DIAGNOSIS — M6281 Muscle weakness (generalized): Secondary | ICD-10-CM | POA: Diagnosis not present

## 2020-06-11 DIAGNOSIS — R296 Repeated falls: Secondary | ICD-10-CM | POA: Diagnosis not present

## 2020-06-11 DIAGNOSIS — Z741 Need for assistance with personal care: Secondary | ICD-10-CM | POA: Diagnosis not present

## 2020-06-12 DIAGNOSIS — Z741 Need for assistance with personal care: Secondary | ICD-10-CM | POA: Diagnosis not present

## 2020-06-12 DIAGNOSIS — R52 Pain, unspecified: Secondary | ICD-10-CM | POA: Diagnosis not present

## 2020-06-12 DIAGNOSIS — R296 Repeated falls: Secondary | ICD-10-CM | POA: Diagnosis not present

## 2020-06-12 DIAGNOSIS — F0150 Vascular dementia without behavioral disturbance: Secondary | ICD-10-CM | POA: Diagnosis not present

## 2020-06-12 DIAGNOSIS — M6281 Muscle weakness (generalized): Secondary | ICD-10-CM | POA: Diagnosis not present

## 2020-06-15 DIAGNOSIS — Z741 Need for assistance with personal care: Secondary | ICD-10-CM | POA: Diagnosis not present

## 2020-06-15 DIAGNOSIS — M6281 Muscle weakness (generalized): Secondary | ICD-10-CM | POA: Diagnosis not present

## 2020-06-15 DIAGNOSIS — R296 Repeated falls: Secondary | ICD-10-CM | POA: Diagnosis not present

## 2020-06-15 DIAGNOSIS — F0150 Vascular dementia without behavioral disturbance: Secondary | ICD-10-CM | POA: Diagnosis not present

## 2020-06-15 DIAGNOSIS — R52 Pain, unspecified: Secondary | ICD-10-CM | POA: Diagnosis not present

## 2020-06-16 DIAGNOSIS — M6281 Muscle weakness (generalized): Secondary | ICD-10-CM | POA: Diagnosis not present

## 2020-06-16 DIAGNOSIS — Z741 Need for assistance with personal care: Secondary | ICD-10-CM | POA: Diagnosis not present

## 2020-06-16 DIAGNOSIS — R52 Pain, unspecified: Secondary | ICD-10-CM | POA: Diagnosis not present

## 2020-06-16 DIAGNOSIS — R296 Repeated falls: Secondary | ICD-10-CM | POA: Diagnosis not present

## 2020-06-16 DIAGNOSIS — F0150 Vascular dementia without behavioral disturbance: Secondary | ICD-10-CM | POA: Diagnosis not present

## 2020-06-17 DIAGNOSIS — R52 Pain, unspecified: Secondary | ICD-10-CM | POA: Diagnosis not present

## 2020-06-17 DIAGNOSIS — R296 Repeated falls: Secondary | ICD-10-CM | POA: Diagnosis not present

## 2020-06-17 DIAGNOSIS — Z741 Need for assistance with personal care: Secondary | ICD-10-CM | POA: Diagnosis not present

## 2020-06-17 DIAGNOSIS — M545 Low back pain: Secondary | ICD-10-CM | POA: Diagnosis not present

## 2020-06-17 DIAGNOSIS — K219 Gastro-esophageal reflux disease without esophagitis: Secondary | ICD-10-CM | POA: Diagnosis not present

## 2020-06-17 DIAGNOSIS — F0150 Vascular dementia without behavioral disturbance: Secondary | ICD-10-CM | POA: Diagnosis not present

## 2020-06-17 DIAGNOSIS — R1312 Dysphagia, oropharyngeal phase: Secondary | ICD-10-CM | POA: Diagnosis not present

## 2020-06-17 DIAGNOSIS — M6281 Muscle weakness (generalized): Secondary | ICD-10-CM | POA: Diagnosis not present

## 2020-06-18 DIAGNOSIS — R296 Repeated falls: Secondary | ICD-10-CM | POA: Diagnosis not present

## 2020-06-18 DIAGNOSIS — R1312 Dysphagia, oropharyngeal phase: Secondary | ICD-10-CM | POA: Diagnosis not present

## 2020-06-18 DIAGNOSIS — K219 Gastro-esophageal reflux disease without esophagitis: Secondary | ICD-10-CM | POA: Diagnosis not present

## 2020-06-18 DIAGNOSIS — R52 Pain, unspecified: Secondary | ICD-10-CM | POA: Diagnosis not present

## 2020-06-18 DIAGNOSIS — M6281 Muscle weakness (generalized): Secondary | ICD-10-CM | POA: Diagnosis not present

## 2020-06-18 DIAGNOSIS — Z741 Need for assistance with personal care: Secondary | ICD-10-CM | POA: Diagnosis not present

## 2020-06-18 DIAGNOSIS — M545 Low back pain: Secondary | ICD-10-CM | POA: Diagnosis not present

## 2020-06-18 DIAGNOSIS — F0150 Vascular dementia without behavioral disturbance: Secondary | ICD-10-CM | POA: Diagnosis not present

## 2020-06-19 DIAGNOSIS — M6281 Muscle weakness (generalized): Secondary | ICD-10-CM | POA: Diagnosis not present

## 2020-06-19 DIAGNOSIS — F0150 Vascular dementia without behavioral disturbance: Secondary | ICD-10-CM | POA: Diagnosis not present

## 2020-06-19 DIAGNOSIS — R1312 Dysphagia, oropharyngeal phase: Secondary | ICD-10-CM | POA: Diagnosis not present

## 2020-06-19 DIAGNOSIS — K219 Gastro-esophageal reflux disease without esophagitis: Secondary | ICD-10-CM | POA: Diagnosis not present

## 2020-06-19 DIAGNOSIS — R296 Repeated falls: Secondary | ICD-10-CM | POA: Diagnosis not present

## 2020-06-19 DIAGNOSIS — R52 Pain, unspecified: Secondary | ICD-10-CM | POA: Diagnosis not present

## 2020-06-19 DIAGNOSIS — Z741 Need for assistance with personal care: Secondary | ICD-10-CM | POA: Diagnosis not present

## 2020-06-19 DIAGNOSIS — M545 Low back pain: Secondary | ICD-10-CM | POA: Diagnosis not present

## 2020-06-22 DIAGNOSIS — F0150 Vascular dementia without behavioral disturbance: Secondary | ICD-10-CM | POA: Diagnosis not present

## 2020-06-22 DIAGNOSIS — M6281 Muscle weakness (generalized): Secondary | ICD-10-CM | POA: Diagnosis not present

## 2020-06-22 DIAGNOSIS — R296 Repeated falls: Secondary | ICD-10-CM | POA: Diagnosis not present

## 2020-06-22 DIAGNOSIS — Z741 Need for assistance with personal care: Secondary | ICD-10-CM | POA: Diagnosis not present

## 2020-06-22 DIAGNOSIS — R52 Pain, unspecified: Secondary | ICD-10-CM | POA: Diagnosis not present

## 2020-06-22 DIAGNOSIS — K219 Gastro-esophageal reflux disease without esophagitis: Secondary | ICD-10-CM | POA: Diagnosis not present

## 2020-06-22 DIAGNOSIS — M545 Low back pain: Secondary | ICD-10-CM | POA: Diagnosis not present

## 2020-06-22 DIAGNOSIS — R1312 Dysphagia, oropharyngeal phase: Secondary | ICD-10-CM | POA: Diagnosis not present

## 2020-06-23 DIAGNOSIS — M545 Low back pain: Secondary | ICD-10-CM | POA: Diagnosis not present

## 2020-06-23 DIAGNOSIS — K219 Gastro-esophageal reflux disease without esophagitis: Secondary | ICD-10-CM | POA: Diagnosis not present

## 2020-06-23 DIAGNOSIS — Z741 Need for assistance with personal care: Secondary | ICD-10-CM | POA: Diagnosis not present

## 2020-06-23 DIAGNOSIS — R52 Pain, unspecified: Secondary | ICD-10-CM | POA: Diagnosis not present

## 2020-06-23 DIAGNOSIS — R1312 Dysphagia, oropharyngeal phase: Secondary | ICD-10-CM | POA: Diagnosis not present

## 2020-06-23 DIAGNOSIS — R296 Repeated falls: Secondary | ICD-10-CM | POA: Diagnosis not present

## 2020-06-23 DIAGNOSIS — M6281 Muscle weakness (generalized): Secondary | ICD-10-CM | POA: Diagnosis not present

## 2020-06-23 DIAGNOSIS — F0150 Vascular dementia without behavioral disturbance: Secondary | ICD-10-CM | POA: Diagnosis not present

## 2020-07-02 DIAGNOSIS — R1312 Dysphagia, oropharyngeal phase: Secondary | ICD-10-CM | POA: Diagnosis not present

## 2020-07-02 DIAGNOSIS — M545 Low back pain: Secondary | ICD-10-CM | POA: Diagnosis not present

## 2020-07-02 DIAGNOSIS — Z741 Need for assistance with personal care: Secondary | ICD-10-CM | POA: Diagnosis not present

## 2020-07-02 DIAGNOSIS — M6281 Muscle weakness (generalized): Secondary | ICD-10-CM | POA: Diagnosis not present

## 2020-07-02 DIAGNOSIS — R52 Pain, unspecified: Secondary | ICD-10-CM | POA: Diagnosis not present

## 2020-07-02 DIAGNOSIS — K219 Gastro-esophageal reflux disease without esophagitis: Secondary | ICD-10-CM | POA: Diagnosis not present

## 2020-07-02 DIAGNOSIS — F0150 Vascular dementia without behavioral disturbance: Secondary | ICD-10-CM | POA: Diagnosis not present

## 2020-07-02 DIAGNOSIS — R296 Repeated falls: Secondary | ICD-10-CM | POA: Diagnosis not present

## 2020-07-03 DIAGNOSIS — K219 Gastro-esophageal reflux disease without esophagitis: Secondary | ICD-10-CM | POA: Diagnosis not present

## 2020-07-03 DIAGNOSIS — M6281 Muscle weakness (generalized): Secondary | ICD-10-CM | POA: Diagnosis not present

## 2020-07-03 DIAGNOSIS — R1312 Dysphagia, oropharyngeal phase: Secondary | ICD-10-CM | POA: Diagnosis not present

## 2020-07-03 DIAGNOSIS — R296 Repeated falls: Secondary | ICD-10-CM | POA: Diagnosis not present

## 2020-07-03 DIAGNOSIS — M545 Low back pain: Secondary | ICD-10-CM | POA: Diagnosis not present

## 2020-07-03 DIAGNOSIS — F0150 Vascular dementia without behavioral disturbance: Secondary | ICD-10-CM | POA: Diagnosis not present

## 2020-07-03 DIAGNOSIS — R52 Pain, unspecified: Secondary | ICD-10-CM | POA: Diagnosis not present

## 2020-07-03 DIAGNOSIS — Z741 Need for assistance with personal care: Secondary | ICD-10-CM | POA: Diagnosis not present

## 2020-07-06 DIAGNOSIS — Z741 Need for assistance with personal care: Secondary | ICD-10-CM | POA: Diagnosis not present

## 2020-07-06 DIAGNOSIS — R296 Repeated falls: Secondary | ICD-10-CM | POA: Diagnosis not present

## 2020-07-06 DIAGNOSIS — I10 Essential (primary) hypertension: Secondary | ICD-10-CM | POA: Diagnosis not present

## 2020-07-06 DIAGNOSIS — F331 Major depressive disorder, recurrent, moderate: Secondary | ICD-10-CM | POA: Diagnosis not present

## 2020-07-06 DIAGNOSIS — M545 Low back pain: Secondary | ICD-10-CM | POA: Diagnosis not present

## 2020-07-06 DIAGNOSIS — M1388 Other specified arthritis, other site: Secondary | ICD-10-CM | POA: Diagnosis not present

## 2020-07-06 DIAGNOSIS — M6281 Muscle weakness (generalized): Secondary | ICD-10-CM | POA: Diagnosis not present

## 2020-07-06 DIAGNOSIS — R1312 Dysphagia, oropharyngeal phase: Secondary | ICD-10-CM | POA: Diagnosis not present

## 2020-07-06 DIAGNOSIS — F0150 Vascular dementia without behavioral disturbance: Secondary | ICD-10-CM | POA: Diagnosis not present

## 2020-07-06 DIAGNOSIS — R52 Pain, unspecified: Secondary | ICD-10-CM | POA: Diagnosis not present

## 2020-07-06 DIAGNOSIS — K219 Gastro-esophageal reflux disease without esophagitis: Secondary | ICD-10-CM | POA: Diagnosis not present

## 2020-07-07 DIAGNOSIS — R296 Repeated falls: Secondary | ICD-10-CM | POA: Diagnosis not present

## 2020-07-07 DIAGNOSIS — K219 Gastro-esophageal reflux disease without esophagitis: Secondary | ICD-10-CM | POA: Diagnosis not present

## 2020-07-07 DIAGNOSIS — R52 Pain, unspecified: Secondary | ICD-10-CM | POA: Diagnosis not present

## 2020-07-07 DIAGNOSIS — F0150 Vascular dementia without behavioral disturbance: Secondary | ICD-10-CM | POA: Diagnosis not present

## 2020-07-07 DIAGNOSIS — Z741 Need for assistance with personal care: Secondary | ICD-10-CM | POA: Diagnosis not present

## 2020-07-07 DIAGNOSIS — M6281 Muscle weakness (generalized): Secondary | ICD-10-CM | POA: Diagnosis not present

## 2020-07-07 DIAGNOSIS — R1312 Dysphagia, oropharyngeal phase: Secondary | ICD-10-CM | POA: Diagnosis not present

## 2020-07-07 DIAGNOSIS — M545 Low back pain: Secondary | ICD-10-CM | POA: Diagnosis not present

## 2020-07-08 DIAGNOSIS — R296 Repeated falls: Secondary | ICD-10-CM | POA: Diagnosis not present

## 2020-07-08 DIAGNOSIS — M6281 Muscle weakness (generalized): Secondary | ICD-10-CM | POA: Diagnosis not present

## 2020-07-08 DIAGNOSIS — M545 Low back pain: Secondary | ICD-10-CM | POA: Diagnosis not present

## 2020-07-08 DIAGNOSIS — R52 Pain, unspecified: Secondary | ICD-10-CM | POA: Diagnosis not present

## 2020-07-08 DIAGNOSIS — F0150 Vascular dementia without behavioral disturbance: Secondary | ICD-10-CM | POA: Diagnosis not present

## 2020-07-08 DIAGNOSIS — R1312 Dysphagia, oropharyngeal phase: Secondary | ICD-10-CM | POA: Diagnosis not present

## 2020-07-08 DIAGNOSIS — Z741 Need for assistance with personal care: Secondary | ICD-10-CM | POA: Diagnosis not present

## 2020-07-08 DIAGNOSIS — K219 Gastro-esophageal reflux disease without esophagitis: Secondary | ICD-10-CM | POA: Diagnosis not present

## 2020-07-09 DIAGNOSIS — K219 Gastro-esophageal reflux disease without esophagitis: Secondary | ICD-10-CM | POA: Diagnosis not present

## 2020-07-09 DIAGNOSIS — M6281 Muscle weakness (generalized): Secondary | ICD-10-CM | POA: Diagnosis not present

## 2020-07-09 DIAGNOSIS — F0150 Vascular dementia without behavioral disturbance: Secondary | ICD-10-CM | POA: Diagnosis not present

## 2020-07-09 DIAGNOSIS — M545 Low back pain: Secondary | ICD-10-CM | POA: Diagnosis not present

## 2020-07-09 DIAGNOSIS — R52 Pain, unspecified: Secondary | ICD-10-CM | POA: Diagnosis not present

## 2020-07-09 DIAGNOSIS — R1312 Dysphagia, oropharyngeal phase: Secondary | ICD-10-CM | POA: Diagnosis not present

## 2020-07-09 DIAGNOSIS — Z741 Need for assistance with personal care: Secondary | ICD-10-CM | POA: Diagnosis not present

## 2020-07-09 DIAGNOSIS — R296 Repeated falls: Secondary | ICD-10-CM | POA: Diagnosis not present

## 2020-07-10 DIAGNOSIS — M545 Low back pain: Secondary | ICD-10-CM | POA: Diagnosis not present

## 2020-07-10 DIAGNOSIS — R296 Repeated falls: Secondary | ICD-10-CM | POA: Diagnosis not present

## 2020-07-10 DIAGNOSIS — M6281 Muscle weakness (generalized): Secondary | ICD-10-CM | POA: Diagnosis not present

## 2020-07-10 DIAGNOSIS — R1312 Dysphagia, oropharyngeal phase: Secondary | ICD-10-CM | POA: Diagnosis not present

## 2020-07-10 DIAGNOSIS — K219 Gastro-esophageal reflux disease without esophagitis: Secondary | ICD-10-CM | POA: Diagnosis not present

## 2020-07-10 DIAGNOSIS — Z741 Need for assistance with personal care: Secondary | ICD-10-CM | POA: Diagnosis not present

## 2020-07-10 DIAGNOSIS — R52 Pain, unspecified: Secondary | ICD-10-CM | POA: Diagnosis not present

## 2020-07-10 DIAGNOSIS — F0150 Vascular dementia without behavioral disturbance: Secondary | ICD-10-CM | POA: Diagnosis not present

## 2020-07-13 DIAGNOSIS — M545 Low back pain: Secondary | ICD-10-CM | POA: Diagnosis not present

## 2020-07-13 DIAGNOSIS — M6281 Muscle weakness (generalized): Secondary | ICD-10-CM | POA: Diagnosis not present

## 2020-07-13 DIAGNOSIS — K219 Gastro-esophageal reflux disease without esophagitis: Secondary | ICD-10-CM | POA: Diagnosis not present

## 2020-07-13 DIAGNOSIS — F0150 Vascular dementia without behavioral disturbance: Secondary | ICD-10-CM | POA: Diagnosis not present

## 2020-07-13 DIAGNOSIS — Z741 Need for assistance with personal care: Secondary | ICD-10-CM | POA: Diagnosis not present

## 2020-07-13 DIAGNOSIS — R296 Repeated falls: Secondary | ICD-10-CM | POA: Diagnosis not present

## 2020-07-13 DIAGNOSIS — R1312 Dysphagia, oropharyngeal phase: Secondary | ICD-10-CM | POA: Diagnosis not present

## 2020-07-13 DIAGNOSIS — R52 Pain, unspecified: Secondary | ICD-10-CM | POA: Diagnosis not present

## 2020-07-14 DIAGNOSIS — M6281 Muscle weakness (generalized): Secondary | ICD-10-CM | POA: Diagnosis not present

## 2020-07-14 DIAGNOSIS — K219 Gastro-esophageal reflux disease without esophagitis: Secondary | ICD-10-CM | POA: Diagnosis not present

## 2020-07-14 DIAGNOSIS — R1312 Dysphagia, oropharyngeal phase: Secondary | ICD-10-CM | POA: Diagnosis not present

## 2020-07-14 DIAGNOSIS — R52 Pain, unspecified: Secondary | ICD-10-CM | POA: Diagnosis not present

## 2020-07-14 DIAGNOSIS — R296 Repeated falls: Secondary | ICD-10-CM | POA: Diagnosis not present

## 2020-07-14 DIAGNOSIS — F0150 Vascular dementia without behavioral disturbance: Secondary | ICD-10-CM | POA: Diagnosis not present

## 2020-07-14 DIAGNOSIS — M545 Low back pain: Secondary | ICD-10-CM | POA: Diagnosis not present

## 2020-07-14 DIAGNOSIS — Z741 Need for assistance with personal care: Secondary | ICD-10-CM | POA: Diagnosis not present

## 2020-07-15 DIAGNOSIS — M545 Low back pain: Secondary | ICD-10-CM | POA: Diagnosis not present

## 2020-07-15 DIAGNOSIS — R1312 Dysphagia, oropharyngeal phase: Secondary | ICD-10-CM | POA: Diagnosis not present

## 2020-07-15 DIAGNOSIS — K219 Gastro-esophageal reflux disease without esophagitis: Secondary | ICD-10-CM | POA: Diagnosis not present

## 2020-07-15 DIAGNOSIS — R52 Pain, unspecified: Secondary | ICD-10-CM | POA: Diagnosis not present

## 2020-07-15 DIAGNOSIS — Z741 Need for assistance with personal care: Secondary | ICD-10-CM | POA: Diagnosis not present

## 2020-07-15 DIAGNOSIS — R296 Repeated falls: Secondary | ICD-10-CM | POA: Diagnosis not present

## 2020-07-15 DIAGNOSIS — F0150 Vascular dementia without behavioral disturbance: Secondary | ICD-10-CM | POA: Diagnosis not present

## 2020-07-15 DIAGNOSIS — M6281 Muscle weakness (generalized): Secondary | ICD-10-CM | POA: Diagnosis not present

## 2020-07-16 DIAGNOSIS — M545 Low back pain: Secondary | ICD-10-CM | POA: Diagnosis not present

## 2020-07-16 DIAGNOSIS — M6281 Muscle weakness (generalized): Secondary | ICD-10-CM | POA: Diagnosis not present

## 2020-07-16 DIAGNOSIS — R296 Repeated falls: Secondary | ICD-10-CM | POA: Diagnosis not present

## 2020-07-16 DIAGNOSIS — R52 Pain, unspecified: Secondary | ICD-10-CM | POA: Diagnosis not present

## 2020-07-16 DIAGNOSIS — Z741 Need for assistance with personal care: Secondary | ICD-10-CM | POA: Diagnosis not present

## 2020-07-16 DIAGNOSIS — R1312 Dysphagia, oropharyngeal phase: Secondary | ICD-10-CM | POA: Diagnosis not present

## 2020-07-16 DIAGNOSIS — F0150 Vascular dementia without behavioral disturbance: Secondary | ICD-10-CM | POA: Diagnosis not present

## 2020-07-16 DIAGNOSIS — K219 Gastro-esophageal reflux disease without esophagitis: Secondary | ICD-10-CM | POA: Diagnosis not present

## 2020-07-18 DIAGNOSIS — E440 Moderate protein-calorie malnutrition: Secondary | ICD-10-CM | POA: Diagnosis not present

## 2020-07-18 DIAGNOSIS — F0390 Unspecified dementia without behavioral disturbance: Secondary | ICD-10-CM | POA: Diagnosis not present

## 2020-07-18 DIAGNOSIS — R1312 Dysphagia, oropharyngeal phase: Secondary | ICD-10-CM | POA: Diagnosis not present

## 2020-07-18 DIAGNOSIS — K219 Gastro-esophageal reflux disease without esophagitis: Secondary | ICD-10-CM | POA: Diagnosis not present

## 2020-07-22 DIAGNOSIS — F0390 Unspecified dementia without behavioral disturbance: Secondary | ICD-10-CM | POA: Diagnosis not present

## 2020-07-22 DIAGNOSIS — K219 Gastro-esophageal reflux disease without esophagitis: Secondary | ICD-10-CM | POA: Diagnosis not present

## 2020-07-22 DIAGNOSIS — R1312 Dysphagia, oropharyngeal phase: Secondary | ICD-10-CM | POA: Diagnosis not present

## 2020-07-22 DIAGNOSIS — E440 Moderate protein-calorie malnutrition: Secondary | ICD-10-CM | POA: Diagnosis not present

## 2020-07-23 DIAGNOSIS — E440 Moderate protein-calorie malnutrition: Secondary | ICD-10-CM | POA: Diagnosis not present

## 2020-07-23 DIAGNOSIS — R1312 Dysphagia, oropharyngeal phase: Secondary | ICD-10-CM | POA: Diagnosis not present

## 2020-07-23 DIAGNOSIS — F0390 Unspecified dementia without behavioral disturbance: Secondary | ICD-10-CM | POA: Diagnosis not present

## 2020-07-23 DIAGNOSIS — K219 Gastro-esophageal reflux disease without esophagitis: Secondary | ICD-10-CM | POA: Diagnosis not present

## 2020-07-27 DIAGNOSIS — K219 Gastro-esophageal reflux disease without esophagitis: Secondary | ICD-10-CM | POA: Diagnosis not present

## 2020-07-27 DIAGNOSIS — F0390 Unspecified dementia without behavioral disturbance: Secondary | ICD-10-CM | POA: Diagnosis not present

## 2020-07-27 DIAGNOSIS — E440 Moderate protein-calorie malnutrition: Secondary | ICD-10-CM | POA: Diagnosis not present

## 2020-07-27 DIAGNOSIS — R1312 Dysphagia, oropharyngeal phase: Secondary | ICD-10-CM | POA: Diagnosis not present

## 2020-07-29 DIAGNOSIS — E440 Moderate protein-calorie malnutrition: Secondary | ICD-10-CM | POA: Diagnosis not present

## 2020-07-29 DIAGNOSIS — F0390 Unspecified dementia without behavioral disturbance: Secondary | ICD-10-CM | POA: Diagnosis not present

## 2020-07-29 DIAGNOSIS — K219 Gastro-esophageal reflux disease without esophagitis: Secondary | ICD-10-CM | POA: Diagnosis not present

## 2020-07-29 DIAGNOSIS — R1312 Dysphagia, oropharyngeal phase: Secondary | ICD-10-CM | POA: Diagnosis not present

## 2020-07-31 DIAGNOSIS — F0390 Unspecified dementia without behavioral disturbance: Secondary | ICD-10-CM | POA: Diagnosis not present

## 2020-07-31 DIAGNOSIS — R1312 Dysphagia, oropharyngeal phase: Secondary | ICD-10-CM | POA: Diagnosis not present

## 2020-07-31 DIAGNOSIS — K219 Gastro-esophageal reflux disease without esophagitis: Secondary | ICD-10-CM | POA: Diagnosis not present

## 2020-07-31 DIAGNOSIS — E440 Moderate protein-calorie malnutrition: Secondary | ICD-10-CM | POA: Diagnosis not present

## 2020-08-03 DIAGNOSIS — F0390 Unspecified dementia without behavioral disturbance: Secondary | ICD-10-CM | POA: Diagnosis not present

## 2020-08-03 DIAGNOSIS — E440 Moderate protein-calorie malnutrition: Secondary | ICD-10-CM | POA: Diagnosis not present

## 2020-08-03 DIAGNOSIS — K219 Gastro-esophageal reflux disease without esophagitis: Secondary | ICD-10-CM | POA: Diagnosis not present

## 2020-08-03 DIAGNOSIS — R1312 Dysphagia, oropharyngeal phase: Secondary | ICD-10-CM | POA: Diagnosis not present

## 2020-08-04 DIAGNOSIS — R1312 Dysphagia, oropharyngeal phase: Secondary | ICD-10-CM | POA: Diagnosis not present

## 2020-08-04 DIAGNOSIS — E440 Moderate protein-calorie malnutrition: Secondary | ICD-10-CM | POA: Diagnosis not present

## 2020-08-04 DIAGNOSIS — K219 Gastro-esophageal reflux disease without esophagitis: Secondary | ICD-10-CM | POA: Diagnosis not present

## 2020-08-04 DIAGNOSIS — F0390 Unspecified dementia without behavioral disturbance: Secondary | ICD-10-CM | POA: Diagnosis not present

## 2020-08-05 DIAGNOSIS — K219 Gastro-esophageal reflux disease without esophagitis: Secondary | ICD-10-CM | POA: Diagnosis not present

## 2020-08-05 DIAGNOSIS — R1312 Dysphagia, oropharyngeal phase: Secondary | ICD-10-CM | POA: Diagnosis not present

## 2020-08-05 DIAGNOSIS — F0390 Unspecified dementia without behavioral disturbance: Secondary | ICD-10-CM | POA: Diagnosis not present

## 2020-08-05 DIAGNOSIS — E440 Moderate protein-calorie malnutrition: Secondary | ICD-10-CM | POA: Diagnosis not present

## 2020-08-06 DIAGNOSIS — F0390 Unspecified dementia without behavioral disturbance: Secondary | ICD-10-CM | POA: Diagnosis not present

## 2020-08-06 DIAGNOSIS — K219 Gastro-esophageal reflux disease without esophagitis: Secondary | ICD-10-CM | POA: Diagnosis not present

## 2020-08-06 DIAGNOSIS — R1312 Dysphagia, oropharyngeal phase: Secondary | ICD-10-CM | POA: Diagnosis not present

## 2020-08-06 DIAGNOSIS — E440 Moderate protein-calorie malnutrition: Secondary | ICD-10-CM | POA: Diagnosis not present

## 2020-08-07 DIAGNOSIS — E440 Moderate protein-calorie malnutrition: Secondary | ICD-10-CM | POA: Diagnosis not present

## 2020-08-07 DIAGNOSIS — K219 Gastro-esophageal reflux disease without esophagitis: Secondary | ICD-10-CM | POA: Diagnosis not present

## 2020-08-07 DIAGNOSIS — R1312 Dysphagia, oropharyngeal phase: Secondary | ICD-10-CM | POA: Diagnosis not present

## 2020-08-07 DIAGNOSIS — F0390 Unspecified dementia without behavioral disturbance: Secondary | ICD-10-CM | POA: Diagnosis not present

## 2020-08-18 DIAGNOSIS — N183 Chronic kidney disease, stage 3 unspecified: Secondary | ICD-10-CM | POA: Diagnosis not present

## 2020-08-31 DIAGNOSIS — I10 Essential (primary) hypertension: Secondary | ICD-10-CM | POA: Diagnosis not present

## 2020-08-31 DIAGNOSIS — I6389 Other cerebral infarction: Secondary | ICD-10-CM | POA: Diagnosis not present

## 2020-08-31 DIAGNOSIS — F331 Major depressive disorder, recurrent, moderate: Secondary | ICD-10-CM | POA: Diagnosis not present

## 2020-08-31 DIAGNOSIS — M5416 Radiculopathy, lumbar region: Secondary | ICD-10-CM | POA: Diagnosis not present

## 2020-09-08 DIAGNOSIS — F064 Anxiety disorder due to known physiological condition: Secondary | ICD-10-CM | POA: Diagnosis not present

## 2020-09-08 DIAGNOSIS — F331 Major depressive disorder, recurrent, moderate: Secondary | ICD-10-CM | POA: Diagnosis not present

## 2020-10-07 DIAGNOSIS — D72828 Other elevated white blood cell count: Secondary | ICD-10-CM | POA: Diagnosis not present

## 2020-10-07 DIAGNOSIS — J181 Lobar pneumonia, unspecified organism: Secondary | ICD-10-CM | POA: Diagnosis not present

## 2020-10-07 DIAGNOSIS — N183 Chronic kidney disease, stage 3 unspecified: Secondary | ICD-10-CM | POA: Diagnosis not present

## 2020-10-07 DIAGNOSIS — D649 Anemia, unspecified: Secondary | ICD-10-CM | POA: Diagnosis not present

## 2020-10-07 DIAGNOSIS — R0902 Hypoxemia: Secondary | ICD-10-CM | POA: Diagnosis not present

## 2020-10-07 DIAGNOSIS — M6281 Muscle weakness (generalized): Secondary | ICD-10-CM | POA: Diagnosis not present

## 2020-10-07 DIAGNOSIS — E870 Hyperosmolality and hypernatremia: Secondary | ICD-10-CM | POA: Diagnosis not present

## 2020-10-08 DIAGNOSIS — R339 Retention of urine, unspecified: Secondary | ICD-10-CM | POA: Diagnosis not present

## 2020-10-09 DIAGNOSIS — N183 Chronic kidney disease, stage 3 unspecified: Secondary | ICD-10-CM | POA: Diagnosis not present

## 2020-10-14 DIAGNOSIS — I10 Essential (primary) hypertension: Secondary | ICD-10-CM | POA: Diagnosis not present

## 2020-10-14 DIAGNOSIS — M6281 Muscle weakness (generalized): Secondary | ICD-10-CM | POA: Diagnosis not present

## 2020-10-14 DIAGNOSIS — E870 Hyperosmolality and hypernatremia: Secondary | ICD-10-CM | POA: Diagnosis not present

## 2020-10-14 DIAGNOSIS — J188 Other pneumonia, unspecified organism: Secondary | ICD-10-CM | POA: Diagnosis not present

## 2020-10-16 DIAGNOSIS — E870 Hyperosmolality and hypernatremia: Secondary | ICD-10-CM | POA: Diagnosis not present

## 2020-10-16 DIAGNOSIS — N1831 Chronic kidney disease, stage 3a: Secondary | ICD-10-CM | POA: Diagnosis not present

## 2020-10-18 DIAGNOSIS — E870 Hyperosmolality and hypernatremia: Secondary | ICD-10-CM | POA: Diagnosis not present

## 2020-11-06 DIAGNOSIS — I129 Hypertensive chronic kidney disease with stage 1 through stage 4 chronic kidney disease, or unspecified chronic kidney disease: Secondary | ICD-10-CM | POA: Diagnosis not present

## 2020-11-06 DIAGNOSIS — F0391 Unspecified dementia with behavioral disturbance: Secondary | ICD-10-CM | POA: Diagnosis not present

## 2020-11-06 DIAGNOSIS — M1990 Unspecified osteoarthritis, unspecified site: Secondary | ICD-10-CM | POA: Diagnosis not present

## 2020-11-06 DIAGNOSIS — N182 Chronic kidney disease, stage 2 (mild): Secondary | ICD-10-CM | POA: Diagnosis not present

## 2021-01-26 DIAGNOSIS — F064 Anxiety disorder due to known physiological condition: Secondary | ICD-10-CM | POA: Diagnosis not present

## 2021-01-26 DIAGNOSIS — F331 Major depressive disorder, recurrent, moderate: Secondary | ICD-10-CM | POA: Diagnosis not present

## 2021-08-17 DEATH — deceased
